# Patient Record
Sex: Male | Born: 1950 | Race: White | Hispanic: No | Marital: Married | Smoking: Never smoker
Health system: Southern US, Community
[De-identification: ages and names within clinical notes are randomized; demographics above are authoritative.]

## PROBLEM LIST (undated history)

## (undated) DIAGNOSIS — E785 Hyperlipidemia, unspecified: Secondary | ICD-10-CM

## (undated) DIAGNOSIS — I10 Essential (primary) hypertension: Secondary | ICD-10-CM

## (undated) DIAGNOSIS — D239 Other benign neoplasm of skin, unspecified: Secondary | ICD-10-CM

## (undated) DIAGNOSIS — Z872 Personal history of diseases of the skin and subcutaneous tissue: Secondary | ICD-10-CM

## (undated) DIAGNOSIS — C329 Malignant neoplasm of larynx, unspecified: Secondary | ICD-10-CM

## (undated) HISTORY — PX: COLONOSCOPY: SHX174

## (undated) HISTORY — PX: OTHER SURGICAL HISTORY: SHX169

## (undated) HISTORY — DX: Hyperlipidemia, unspecified: E78.5

## (undated) HISTORY — DX: Other benign neoplasm of skin, unspecified: D23.9

## (undated) HISTORY — DX: Essential (primary) hypertension: I10

## (undated) HISTORY — PX: KNEE ARTHROSCOPY: SUR90

## (undated) HISTORY — PX: PILONIDAL CYST EXCISION: SHX744

## (undated) HISTORY — DX: Malignant neoplasm of larynx, unspecified: C32.9

## (undated) HISTORY — PX: VASECTOMY: SHX75

## (undated) HISTORY — DX: Personal history of diseases of the skin and subcutaneous tissue: Z87.2

---

## 2006-09-19 DIAGNOSIS — C329 Malignant neoplasm of larynx, unspecified: Secondary | ICD-10-CM

## 2006-09-19 HISTORY — DX: Malignant neoplasm of larynx, unspecified: C32.9

## 2006-09-19 HISTORY — PX: NASAL SINUS SURGERY: SHX719

## 2006-09-19 HISTORY — PX: NASAL SEPTUM SURGERY: SHX37

## 2007-01-23 ENCOUNTER — Encounter: Payer: Self-pay | Admitting: Family Medicine

## 2007-01-23 ENCOUNTER — Ambulatory Visit: Payer: Self-pay | Admitting: Gastroenterology

## 2007-01-23 LAB — HM COLONOSCOPY

## 2009-02-09 DIAGNOSIS — D239 Other benign neoplasm of skin, unspecified: Secondary | ICD-10-CM

## 2009-02-09 HISTORY — DX: Other benign neoplasm of skin, unspecified: D23.9

## 2010-06-03 ENCOUNTER — Ambulatory Visit: Payer: Self-pay | Admitting: Internal Medicine

## 2017-03-23 ENCOUNTER — Ambulatory Visit: Payer: Self-pay | Admitting: Family Medicine

## 2017-04-25 ENCOUNTER — Ambulatory Visit: Payer: Self-pay | Admitting: Family Medicine

## 2018-01-30 DIAGNOSIS — D2272 Melanocytic nevi of left lower limb, including hip: Secondary | ICD-10-CM | POA: Diagnosis not present

## 2018-01-30 DIAGNOSIS — D224 Melanocytic nevi of scalp and neck: Secondary | ICD-10-CM | POA: Diagnosis not present

## 2018-01-30 DIAGNOSIS — L905 Scar conditions and fibrosis of skin: Secondary | ICD-10-CM | POA: Diagnosis not present

## 2018-01-30 DIAGNOSIS — L720 Epidermal cyst: Secondary | ICD-10-CM | POA: Diagnosis not present

## 2018-01-30 DIAGNOSIS — D225 Melanocytic nevi of trunk: Secondary | ICD-10-CM | POA: Diagnosis not present

## 2018-04-10 DIAGNOSIS — L720 Epidermal cyst: Secondary | ICD-10-CM | POA: Diagnosis not present

## 2018-04-17 DIAGNOSIS — L720 Epidermal cyst: Secondary | ICD-10-CM | POA: Diagnosis not present

## 2018-06-27 DIAGNOSIS — D485 Neoplasm of uncertain behavior of skin: Secondary | ICD-10-CM | POA: Diagnosis not present

## 2018-06-27 DIAGNOSIS — Z85828 Personal history of other malignant neoplasm of skin: Secondary | ICD-10-CM | POA: Diagnosis not present

## 2018-06-27 DIAGNOSIS — L918 Other hypertrophic disorders of the skin: Secondary | ICD-10-CM | POA: Diagnosis not present

## 2018-06-27 DIAGNOSIS — L821 Other seborrheic keratosis: Secondary | ICD-10-CM | POA: Diagnosis not present

## 2018-06-27 DIAGNOSIS — D2270 Melanocytic nevi of unspecified lower limb, including hip: Secondary | ICD-10-CM | POA: Diagnosis not present

## 2018-06-27 DIAGNOSIS — L578 Other skin changes due to chronic exposure to nonionizing radiation: Secondary | ICD-10-CM | POA: Diagnosis not present

## 2018-06-27 DIAGNOSIS — Z1283 Encounter for screening for malignant neoplasm of skin: Secondary | ICD-10-CM | POA: Diagnosis not present

## 2018-06-27 DIAGNOSIS — D225 Melanocytic nevi of trunk: Secondary | ICD-10-CM | POA: Diagnosis not present

## 2018-06-27 DIAGNOSIS — D2260 Melanocytic nevi of unspecified upper limb, including shoulder: Secondary | ICD-10-CM | POA: Diagnosis not present

## 2018-06-27 DIAGNOSIS — D224 Melanocytic nevi of scalp and neck: Secondary | ICD-10-CM | POA: Diagnosis not present

## 2018-07-19 ENCOUNTER — Ambulatory Visit (INDEPENDENT_AMBULATORY_CARE_PROVIDER_SITE_OTHER): Payer: Medicare HMO | Admitting: Family Medicine

## 2018-07-19 ENCOUNTER — Encounter: Payer: Self-pay | Admitting: Family Medicine

## 2018-07-19 VITALS — BP 172/92 | HR 62 | Temp 98.3°F | Ht 72.0 in | Wt 245.2 lb

## 2018-07-19 DIAGNOSIS — Z6833 Body mass index (BMI) 33.0-33.9, adult: Secondary | ICD-10-CM | POA: Diagnosis not present

## 2018-07-19 DIAGNOSIS — I10 Essential (primary) hypertension: Secondary | ICD-10-CM | POA: Diagnosis not present

## 2018-07-19 DIAGNOSIS — Z8521 Personal history of malignant neoplasm of larynx: Secondary | ICD-10-CM | POA: Diagnosis not present

## 2018-07-19 DIAGNOSIS — Z23 Encounter for immunization: Secondary | ICD-10-CM | POA: Diagnosis not present

## 2018-07-19 DIAGNOSIS — E669 Obesity, unspecified: Secondary | ICD-10-CM

## 2018-07-19 DIAGNOSIS — E785 Hyperlipidemia, unspecified: Secondary | ICD-10-CM | POA: Insufficient documentation

## 2018-07-19 DIAGNOSIS — R69 Illness, unspecified: Secondary | ICD-10-CM | POA: Diagnosis not present

## 2018-07-19 DIAGNOSIS — Z1159 Encounter for screening for other viral diseases: Secondary | ICD-10-CM

## 2018-07-19 DIAGNOSIS — Z114 Encounter for screening for human immunodeficiency virus [HIV]: Secondary | ICD-10-CM

## 2018-07-19 MED ORDER — AMLODIPINE BESYLATE 10 MG PO TABS: 10.0000 mg | ORAL_TABLET | Freq: Every day | ORAL | 3 refills | Status: DC

## 2018-07-19 NOTE — Assessment & Plan Note (Signed)
Previous lipids unavailable Will recheck FLP and CMP May restart a statin pending ASCVD risk

## 2018-07-19 NOTE — Patient Instructions (Signed)
The CDC recommends two doses of Shingrix (the shingles vaccine) separated by 2 to 6 months for adults age 65 years and older. I recommend checking with your insurance plan regarding coverage for this vaccine.    Hypertension Hypertension, commonly called high blood pressure, is when the force of blood pumping through the arteries is too strong. The arteries are the blood vessels that carry blood from the heart throughout the body. Hypertension forces the heart to work harder to pump blood and may cause arteries to become narrow or stiff. Having untreated or uncontrolled hypertension can cause heart attacks, strokes, kidney disease, and other problems. A blood pressure reading consists of a higher number over a lower number. Ideally, your blood pressure should be below 120/80. The first ("top") number is called the systolic pressure. It is a measure of the pressure in your arteries as your heart beats. The second ("bottom") number is called the diastolic pressure. It is a measure of the pressure in your arteries as the heart relaxes. What are the causes? The cause of this condition is not known. What increases the risk? Some risk factors for high blood pressure are under your control. Others are not. Factors you can change  Smoking.  Having type 2 diabetes mellitus, high cholesterol, or both.  Not getting enough exercise or physical activity.  Being overweight.  Having too much fat, sugar, calories, or salt (sodium) in your diet.  Drinking too much alcohol. Factors that are difficult or impossible to change  Having chronic kidney disease.  Having a family history of high blood pressure.  Age. Risk increases with age.  Race. You may be at higher risk if you are African-American.  Gender. Men are at higher risk than women before age 24. After age 17, women are at higher risk than men.  Having obstructive sleep apnea.  Stress. What are the signs or symptoms? Extremely high blood  pressure (hypertensive crisis) may cause:  Headache.  Anxiety.  Shortness of breath.  Nosebleed.  Nausea and vomiting.  Severe chest pain.  Jerky movements you cannot control (seizures).  How is this diagnosed? This condition is diagnosed by measuring your blood pressure while you are seated, with your arm resting on a surface. The cuff of the blood pressure monitor will be placed directly against the skin of your upper arm at the level of your heart. It should be measured at least twice using the same arm. Certain conditions can cause a difference in blood pressure between your right and left arms. Certain factors can cause blood pressure readings to be lower or higher than normal (elevated) for a short period of time:  When your blood pressure is higher when you are in a health care provider's office than when you are at home, this is called white coat hypertension. Most people with this condition do not need medicines.  When your blood pressure is higher at home than when you are in a health care provider's office, this is called masked hypertension. Most people with this condition may need medicines to control blood pressure.  If you have a high blood pressure reading during one visit or you have normal blood pressure with other risk factors:  You may be asked to return on a different day to have your blood pressure checked again.  You may be asked to monitor your blood pressure at home for 1 week or longer.  If you are diagnosed with hypertension, you may have other blood or imaging tests  to help your health care provider understand your overall risk for other conditions. How is this treated? This condition is treated by making healthy lifestyle changes, such as eating healthy foods, exercising more, and reducing your alcohol intake. Your health care provider may prescribe medicine if lifestyle changes are not enough to get your blood pressure under control, and if:  Your  systolic blood pressure is above 130.  Your diastolic blood pressure is above 80.  Your personal target blood pressure may vary depending on your medical conditions, your age, and other factors. Follow these instructions at home: Eating and drinking  Eat a diet that is high in fiber and potassium, and low in sodium, added sugar, and fat. An example eating plan is called the DASH (Dietary Approaches to Stop Hypertension) diet. To eat this way: ? Eat plenty of fresh fruits and vegetables. Try to fill half of your plate at each meal with fruits and vegetables. ? Eat whole grains, such as whole wheat pasta, brown rice, or whole grain bread. Fill about one quarter of your plate with whole grains. ? Eat or drink low-fat dairy products, such as skim milk or low-fat yogurt. ? Avoid fatty cuts of meat, processed or cured meats, and poultry with skin. Fill about one quarter of your plate with lean proteins, such as fish, chicken without skin, beans, eggs, and tofu. ? Avoid premade and processed foods. These tend to be higher in sodium, added sugar, and fat.  Reduce your daily sodium intake. Most people with hypertension should eat less than 1,500 mg of sodium a day.  Limit alcohol intake to no more than 1 drink a day for nonpregnant women and 2 drinks a day for men. One drink equals 12 oz of beer, 5 oz of wine, or 1 oz of hard liquor. Lifestyle  Work with your health care provider to maintain a healthy body weight or to lose weight. Ask what an ideal weight is for you.  Get at least 30 minutes of exercise that causes your heart to beat faster (aerobic exercise) most days of the week. Activities may include walking, swimming, or biking.  Include exercise to strengthen your muscles (resistance exercise), such as pilates or lifting weights, as part of your weekly exercise routine. Try to do these types of exercises for 30 minutes at least 3 days a week.  Do not use any products that contain nicotine or  tobacco, such as cigarettes and e-cigarettes. If you need help quitting, ask your health care provider.  Monitor your blood pressure at home as told by your health care provider.  Keep all follow-up visits as told by your health care provider. This is important. Medicines  Take over-the-counter and prescription medicines only as told by your health care provider. Follow directions carefully. Blood pressure medicines must be taken as prescribed.  Do not skip doses of blood pressure medicine. Doing this puts you at risk for problems and can make the medicine less effective.  Ask your health care provider about side effects or reactions to medicines that you should watch for. Contact a health care provider if:  You think you are having a reaction to a medicine you are taking.  You have headaches that keep coming back (recurring).  You feel dizzy.  You have swelling in your ankles.  You have trouble with your vision. Get help right away if:  You develop a severe headache or confusion.  You have unusual weakness or numbness.  You feel faint.  You have severe pain in your chest or abdomen.  You vomit repeatedly.  You have trouble breathing. Summary  Hypertension is when the force of blood pumping through your arteries is too strong. If this condition is not controlled, it may put you at risk for serious complications.  Your personal target blood pressure may vary depending on your medical conditions, your age, and other factors. For most people, a normal blood pressure is less than 120/80.  Hypertension is treated with lifestyle changes, medicines, or a combination of both. Lifestyle changes include weight loss, eating a healthy, low-sodium diet, exercising more, and limiting alcohol. This information is not intended to replace advice given to you by your health care provider. Make sure you discuss any questions you have with your health care provider. Document Released:  09/05/2005 Document Revised: 08/03/2016 Document Reviewed: 08/03/2016 Elsevier Interactive Patient Education  [employer].

## 2018-07-19 NOTE — Assessment & Plan Note (Signed)
Uncontrolled But not currently on any therapy Will start amlodipine 10mg  daily Discussed home BP monitoring Check CMP F/u in 6 weeks

## 2018-07-19 NOTE — Assessment & Plan Note (Signed)
Cleared by oncology Doing well

## 2018-07-19 NOTE — Assessment & Plan Note (Signed)
Discussed importance of healthy weight management Discussed diet and exercise  

## 2018-07-19 NOTE — Progress Notes (Signed)
Patient: Tyler [name], Male    DOB: 04/12/1951, 67 y.o.   MRN: [id number] Visit Date: 07/19/2018  Today's Provider: Lavon Paganini, MD   Chief Complaint  Patient presents with  . Establish Care   Subjective:    New Patient: Tyler [name] is a 67 y.o. male who presents today to establish care as a new patient. Patient was a patient at [facility].  He feels well. He reports exercising lightly- golfing 3 days per week and working. He reports he is sleeping well.  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  He has been cleared.  He has never smoked or used chewing tobacco.  Patient has h/o HTN and HLD.  He has previously been prescribed ASA, benazepril and crestor, but admits that he does not take his medications.  He also knows that he does not watch his weight like he should.  2 Paternal uncles and brother with colon cancer in their 87s.  He had colonoscopy in 2008, but believes he has had one more recently. -----------------------------------------------------------------   Review of Systems  Constitutional: Negative.   HENT: Positive for dental problem. Negative for congestion, drooling, ear discharge, ear pain, facial swelling, hearing loss, mouth sores, nosebleeds, postnasal drip, rhinorrhea, sinus pressure, sinus pain, sneezing, sore throat, tinnitus, trouble swallowing and voice change.   Eyes: Negative.   Respiratory: Negative.   Cardiovascular: Negative.   Gastrointestinal: Negative.   Endocrine: Negative.   Genitourinary: Negative.   Musculoskeletal: Negative.   Skin: Negative.   Allergic/Immunologic: Negative.   Neurological: Negative.   Hematological: Negative.   Psychiatric/Behavioral: Negative.     Social History      He  reports that he has never smoked. He has never used smokeless tobacco. He reports that he drinks about 3.0 standard drinks of alcohol per week.       Social History    Socioeconomic History  . Marital status: Married    Spouse name: Not on file  . Number of children: Not on file  . Years of education: Not on file  . Highest education level: Not on file  Occupational History  . Not on file  Social Needs  . Financial resource strain: Not on file  . Food insecurity:    Worry: Not on file    Inability: Not on file  . Transportation needs:    Medical: Not on file    Non-medical: Not on file  Tobacco Use  . Smoking status: Never Smoker  . Smokeless tobacco: Never Used  Substance and Sexual Activity  . Alcohol use: Yes    Alcohol/week: 3.0 standard drinks    Types: 3 Cans of beer per week  . Drug use: Not on file  . Sexual activity: Yes    Partners: Female  Lifestyle  . Physical activity:    Days per week: Not on file    Minutes per session: Not on file  . Stress: Not on file  Relationships  . Social connections:    Talks on phone: Not on file    Gets together: Not on file    Attends religious service: Not on file    Active member of club or organization: Not on file    Attends meetings of clubs or organizations: Not on file    Relationship status: Not on file  Other Topics Concern  . Not on file  Social History Narrative  . Not  on file    Past Medical History:  Diagnosis Date  . History of pilonidal cyst   . Hyperlipidemia   . Hypertension   . Laryngeal cancer ([place])      There are no active problems to display for this patient.   Past Surgical History:  Procedure Laterality Date  . NASAL SEPTUM SURGERY  12-21-2006  . NASAL SINUS SURGERY  12/21/06  . right knee surgery    . VASECTOMY      Family History        Family Status  Relation Name Status  . Mother  Deceased       12/20/05  . Father  Deceased       75  . Brother  Deceased  . MGM  Deceased  . MGF  Deceased  . PGM  Deceased  . PGF  Deceased  . Daughter  Alive  . Daughter  Alive        His family history includes CVA in his mother; Colon cancer in his brother;  Diabetes in his brother; Healthy in his daughter and daughter; Heart disease in his mother; Hyperlipidemia in his mother; Hypertension in his mother; Leukemia in his father.      No Known Allergies   Current Outpatient Medications:  .  aspirin EC 81 MG tablet, Take by mouth., Disp: , Rfl:  .  benazepril (LOTENSIN) 40 MG tablet, Take by mouth., Disp: , Rfl:  .  rosuvastatin (CRESTOR) 40 MG tablet, Take by mouth., Disp: , Rfl:  .  sildenafil (VIAGRA) 100 MG tablet, Take by mouth., Disp: , Rfl:    Patient Care Team: Virginia Crews, MD as PCP - General (Family Medicine)      Objective:   Vitals: BP (!) 172/92 (BP Location: Right Arm, Patient Position: Sitting, Cuff Size: Large)   Pulse 62   Temp 98.3 F (36.8 C) (Oral)   Ht 6' (1.829 m)   Wt 245 lb 3.2 oz (111.2 kg)   SpO2 94%   BMI 33.26 kg/m    Vitals:   07/19/18 1011  BP: (!) 172/92  Pulse: 62  Temp: 98.3 F (36.8 C)  TempSrc: Oral  SpO2: 94%  Weight: 245 lb 3.2 oz (111.2 kg)  Height: 6' (1.829 m)     Physical Exam   Depression Screen PHQ 2/9 Scores 07/19/2018  PHQ - 2 Score 0  PHQ- 9 Score 0      Assessment & Plan:     Establish care  Exercise Activities and Dietary recommendations Goals   None     Immunization History  Administered Date(s) Administered  . Influenza, High Dose Seasonal PF 07/19/2018  . Pneumococcal Conjugate-13 07/19/2018  . Td 03/04/2008    Health Maintenance  Topic Date Due  . Hepatitis C Screening  18-Nov-1950  . HIV Screening  02/16/1968  . COLONOSCOPY  01/22/2017  . PNA vac Low Risk Adult (1 of 2 - PCV13) 02/15/2018  . TETANUS/TDAP  03/04/2018  . INFLUENZA VACCINE  04/19/2018     Discussed health benefits of physical activity, and encouraged him to engage in regular exercise appropriate for his age and condition.   Request for information sent to urgent care for last tetanus shot and hospital for last colonoscopy report     --------------------------------------------------------------------   Problem List Items Addressed This Visit      Cardiovascular and Mediastinum   Essential hypertension - Primary    Uncontrolled But not currently on any therapy Will start amlodipine 10mg   daily Discussed home BP monitoring Check CMP F/u in 6 weeks      Relevant Medications   sildenafil (VIAGRA) 100 MG tablet   amLODipine (NORVASC) 10 MG tablet   Other Relevant Orders   Comprehensive metabolic panel     Respiratory   History of laryngeal cancer    Cleared by oncology Doing well        Other   Hyperlipidemia    Previous lipids unavailable Will recheck FLP and CMP May restart a statin pending ASCVD risk      Relevant Medications   sildenafil (VIAGRA) 100 MG tablet   amLODipine (NORVASC) 10 MG tablet   Other Relevant Orders   Lipid panel   Comprehensive metabolic panel   Obesity    Discussed importance of healthy weight management Discussed diet and exercise      Relevant Orders   Lipid panel   Comprehensive metabolic panel   CBC w/Diff/Platelet    Other Visit Diagnoses    Need for hepatitis C screening test       Relevant Orders   Hepatitis C Antibody   Screening for HIV (human immunodeficiency virus)       Relevant Orders   HIV Antibody (routine testing w rflx)   Need for influenza vaccination       Relevant Orders   Flu vaccine HIGH DOSE PF (Completed)   Need for pneumococcal vaccination       Relevant Orders   Pneumococcal conjugate vaccine 13-valent IM (Completed)       Return in about 6 weeks (around 08/30/2018) for Welcome to Medicare and BP f/u.   The entirety of the information documented in the History of Present Illness, Review of Systems and Physical Exam were personally obtained by me. Portions of this information were initially documented by Tiburcio Pea, CMA and reviewed by me for thoroughness and accuracy.    Virginia Crews, MD, MPH [facility] 07/19/2018 11:31 AM

## 2018-07-23 DIAGNOSIS — E669 Obesity, unspecified: Secondary | ICD-10-CM | POA: Diagnosis not present

## 2018-07-23 DIAGNOSIS — Z1159 Encounter for screening for other viral diseases: Secondary | ICD-10-CM | POA: Diagnosis not present

## 2018-07-23 DIAGNOSIS — Z114 Encounter for screening for human immunodeficiency virus [HIV]: Secondary | ICD-10-CM | POA: Diagnosis not present

## 2018-07-23 DIAGNOSIS — Z6833 Body mass index (BMI) 33.0-33.9, adult: Secondary | ICD-10-CM | POA: Diagnosis not present

## 2018-07-23 DIAGNOSIS — E785 Hyperlipidemia, unspecified: Secondary | ICD-10-CM | POA: Diagnosis not present

## 2018-07-23 DIAGNOSIS — R69 Illness, unspecified: Secondary | ICD-10-CM | POA: Diagnosis not present

## 2018-07-23 DIAGNOSIS — I10 Essential (primary) hypertension: Secondary | ICD-10-CM | POA: Diagnosis not present

## 2018-07-24 ENCOUNTER — Telehealth: Payer: Self-pay

## 2018-07-24 LAB — COMPREHENSIVE METABOLIC PANEL
ALT: 18 IU/L (ref 0–44)
AST: 15 IU/L (ref 0–40)
Albumin/Globulin Ratio: 1.2 (ref 1.2–2.2)
Albumin: 4 g/dL (ref 3.6–4.8)
Alkaline Phosphatase: 64 IU/L (ref 39–117)
BUN/Creatinine Ratio: 13 (ref 10–24)
BUN: 17 mg/dL (ref 8–27)
Bilirubin Total: 0.5 mg/dL (ref 0.0–1.2)
CO2: 21 mmol/L (ref 20–29)
Calcium: 9.5 mg/dL (ref 8.6–10.2)
Chloride: 102 mmol/L (ref 96–106)
Creatinine, Ser: 1.28 mg/dL — ABNORMAL HIGH (ref 0.76–1.27)
GFR calc Af Amer: 67 mL/min/{1.73_m2} (ref >59)
GFR calc non Af Amer: 58 mL/min/{1.73_m2} — ABNORMAL LOW (ref >59)
Globulin, Total: 3.3 g/dL (ref 1.5–4.5)
Glucose: 93 mg/dL (ref 65–99)
Potassium: 4.6 mmol/L (ref 3.5–5.2)
Sodium: 140 mmol/L (ref 134–144)
Total Protein: 7.3 g/dL (ref 6.0–8.5)

## 2018-07-24 LAB — CBC WITH DIFFERENTIAL/PLATELET
Basophils Absolute: 0.1 10*3/uL (ref 0.0–0.2)
Basos: 1 %
EOS (ABSOLUTE): 0.1 10*3/uL (ref 0.0–0.4)
Eos: 2 %
Hematocrit: 47.1 % (ref 37.5–51.0)
Hemoglobin: 16.5 g/dL (ref 13.0–17.7)
Immature Grans (Abs): 0 10*3/uL (ref 0.0–0.1)
Immature Granulocytes: 0 %
Lymphocytes Absolute: 0.9 10*3/uL (ref 0.7–3.1)
Lymphs: 16 %
MCH: 31.9 pg (ref 26.6–33.0)
MCHC: 35 g/dL (ref 31.5–35.7)
MCV: 91 fL (ref 79–97)
Monocytes Absolute: 0.5 10*3/uL (ref 0.1–0.9)
Monocytes: 9 %
Neutrophils Absolute: 4 10*3/uL (ref 1.4–7.0)
Neutrophils: 72 %
Platelets: 241 10*3/uL (ref 150–450)
RBC: 5.17 x10E6/uL (ref 4.14–5.80)
RDW: 12.5 % (ref 12.3–15.4)
WBC: 5.6 10*3/uL (ref 3.4–10.8)

## 2018-07-24 LAB — LIPID PANEL
Chol/HDL Ratio: 6.8 ratio — ABNORMAL HIGH (ref 0.0–5.0)
Cholesterol, Total: 277 mg/dL — ABNORMAL HIGH (ref 100–199)
HDL: 41 mg/dL (ref >39)
LDL Calculated: 181 mg/dL — ABNORMAL HIGH (ref 0–99)
Triglycerides: 273 mg/dL — ABNORMAL HIGH (ref 0–149)
VLDL Cholesterol Cal: 55 mg/dL — ABNORMAL HIGH (ref 5–40)

## 2018-07-24 LAB — HEPATITIS C ANTIBODY: Hep C Virus Ab: <0.1 s/co ratio (ref 0.0–0.9)

## 2018-07-24 LAB — HIV ANTIBODY (ROUTINE TESTING W REFLEX): HIV Screen 4th Generation wRfx: NONREACTIVE

## 2018-07-24 NOTE — Telephone Encounter (Signed)
-----   Message from Virginia Crews, MD sent at 07/24/2018  9:27 AM EST ----- Negative hepatitis C and HIV screening.  Cholesterol is quite elevated at nearly double the normal range.  Give believe he need to be on a statin.  Recommend Crestor as you were previously taking this.  Would start with 10 mg daily.  Would recheck cholesterol in about 3 months to see if we are having the effect that is desired.  Normal blood counts, blood sugar, electrolytes, liver function.  Kidney function is slightly decreased.  Unclear if this is a chronic problem or an acute bump.  Recommend staying hydrated and avoiding NSAIDs.  We will recheck at next visit.  Virginia Crews, MD, MPH [facility] 07/24/2018 9:27 AM

## 2018-07-24 NOTE — Telephone Encounter (Signed)
Returned patient call no answer so [place].

## 2018-07-24 NOTE — Telephone Encounter (Signed)
LMTCB

## 2018-07-24 NOTE — Telephone Encounter (Signed)
Pt returned call

## 2018-07-30 NOTE — Telephone Encounter (Signed)
LMTCB

## 2018-07-31 NOTE — Telephone Encounter (Signed)
Patient was advised and stated the Crestor can be send to the [facility] on [address]. Patient states she will schedule his 3 month follow-up at his physical appointment on 09/03/2018.

## 2018-08-01 MED ORDER — ROSUVASTATIN CALCIUM 10 MG PO TABS: 10.0000 mg | ORAL_TABLET | Freq: Every day | ORAL | 3 refills | Status: DC

## 2018-08-01 NOTE — Telephone Encounter (Signed)
Med sent to pharmacy  Bacigalupo, Dionne Bucy, MD, MPH [facility] 08/01/2018 10:37 AM

## 2018-09-03 ENCOUNTER — Ambulatory Visit (INDEPENDENT_AMBULATORY_CARE_PROVIDER_SITE_OTHER): Payer: Medicare HMO | Admitting: Family Medicine

## 2018-09-03 ENCOUNTER — Encounter: Payer: Self-pay | Admitting: Family Medicine

## 2018-09-03 VITALS — BP 139/89 | HR 64 | Temp 98.0°F | Ht 72.0 in | Wt 252.8 lb

## 2018-09-03 DIAGNOSIS — Z0000 Encounter for general adult medical examination without abnormal findings: Secondary | ICD-10-CM | POA: Diagnosis not present

## 2018-09-03 DIAGNOSIS — Z1211 Encounter for screening for malignant neoplasm of colon: Secondary | ICD-10-CM | POA: Diagnosis not present

## 2018-09-03 NOTE — Patient Instructions (Signed)
Preventive Care 65 Years and Older, Male Preventive care refers to lifestyle choices and visits with your health care provider that can promote health and wellness. What does preventive care include?  A yearly physical exam. This is also called an annual well check.  Dental exams once or twice a year.  Routine eye exams. Ask your health care provider how often you should have your eyes checked.  Personal lifestyle choices, including: ? Daily care of your teeth and gums. ? Regular physical activity. ? Eating a healthy diet. ? Avoiding tobacco and drug use. ? Limiting alcohol use. ? Practicing safe sex. ? Taking low doses of aspirin every day. ? Taking vitamin and mineral supplements as recommended by your health care provider. What happens during an annual well check? The services and screenings done by your health care provider during your annual well check will depend on your age, overall health, lifestyle risk factors, and family history of disease. Counseling Your health care provider may ask you questions about your:  Alcohol use.  Tobacco use.  Drug use.  Emotional well-being.  Home and relationship well-being.  Sexual activity.  Eating habits.  History of falls.  Memory and ability to understand (cognition).  Work and work environment.  Screening You may have the following tests or measurements:  Height, weight, and BMI.  Blood pressure.  Lipid and cholesterol levels. These may be checked every 5 years, or more frequently if you are over 50 years old.  Skin check.  Lung cancer screening. You may have this screening every year starting at age 55 if you have a 30-pack-year history of smoking and currently smoke or have quit within the past 15 years.  Fecal occult blood test (FOBT) of the stool. You may have this test every year starting at age 50.  Flexible sigmoidoscopy or colonoscopy. You may have a sigmoidoscopy every 5 years or a colonoscopy every 10  years starting at age 50.  Prostate cancer screening. Recommendations will vary depending on your family history and other risks.  Hepatitis C blood test.  Hepatitis B blood test.  Sexually transmitted disease (STD) testing.  Diabetes screening. This is done by checking your blood sugar (glucose) after you have not eaten for a while (fasting). You may have this done every 1-3 years.  Abdominal aortic aneurysm (AAA) screening. You may need this if you are a current or former smoker.  Osteoporosis. You may be screened starting at age 70 if you are at high risk.  Talk with your health care provider about your test results, treatment options, and if necessary, the need for more tests. Vaccines Your health care provider may recommend certain vaccines, such as:  Influenza vaccine. This is recommended every year.  Tetanus, diphtheria, and acellular pertussis (Tdap, Td) vaccine. You may need a Td booster every 10 years.  Varicella vaccine. You may need this if you have not been vaccinated.  Zoster vaccine. You may need this after age 60.  Measles, mumps, and rubella (MMR) vaccine. You may need at least one dose of MMR if you were born in 1957 or later. You may also need a second dose.  Pneumococcal 13-valent conjugate (PCV13) vaccine. One dose is recommended after age 65.  Pneumococcal polysaccharide (PPSV23) vaccine. One dose is recommended after age 65.  Meningococcal vaccine. You may need this if you have certain conditions.  Hepatitis A vaccine. You may need this if you have certain conditions or if you travel or work in places where you   may be exposed to hepatitis A.  Hepatitis B vaccine. You may need this if you have certain conditions or if you travel or work in places where you may be exposed to hepatitis B.  Haemophilus influenzae type b (Hib) vaccine. You may need this if you have certain risk factors.  Talk to your health care provider about which screenings and vaccines  you need and how often you need them. This information is not intended to replace advice given to you by your health care provider. Make sure you discuss any questions you have with your health care provider. Document Released: 10/02/2015 Document Revised: 05/25/2016 Document Reviewed: 07/07/2015 Elsevier Interactive Patient Education  2018 [employer]  

## 2018-09-03 NOTE — Progress Notes (Signed)
Patient: Tyler [name], Male    DOB: 19-Sep-1951, 67 y.o.   MRN: [id number] Visit Date: 09/03/2018  Today's Provider: Lavon Paganini, MD   Chief Complaint  Patient presents with  . Medicare Wellness   Subjective:  I, Tyler [name], CMA, am acting as a scribe for Lavon Paganini, MD.   Initial preventative physical exam Tyler [name] is a 67 y.o. male who presents today for his Initial Preventative Physical Exam. He feels well. He reports  No regular exercise, plays golf and yard work if weather permits. He reports he is sleeping well.  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   HENT: Positive for dental problem.   Eyes: Negative.   Respiratory: Negative.   Cardiovascular: Negative.   Gastrointestinal: Negative.   Endocrine: Negative.   Genitourinary: Negative.   Musculoskeletal: Negative.   Skin: Negative.   Allergic/Immunologic: Negative.   Neurological: Negative.   Hematological: Negative.   Psychiatric/Behavioral: Negative.     Social History   Socioeconomic History  . Marital status: Married    Spouse name: Not on file  . Number of children: 2  . Years of education: Not on file  . Highest education level: Not on file  Occupational History  . Occupation: retired Advertising copywriter  . Financial resource strain: Not on file  . Food insecurity:    Worry: Not on file    Inability: Not on file  . Transportation needs:    Medical: Not on file    Non-medical: Not on file  Tobacco Use  . Smoking status: Never Smoker  . Smokeless tobacco: Never Used  Substance and Sexual Activity  . Alcohol use: Yes    Alcohol/week: 3.0 standard drinks    Types: 3 Cans of beer per week  . Drug use: Never  . Sexual activity: Yes    Partners: Female    Birth control/protection: Surgical  Lifestyle  .  Physical activity:    Days per week: Not on file    Minutes per session: Not on file  . Stress: Not on file  Relationships  . Social connections:    Talks on phone: Not on file    Gets together: Not on file    Attends religious service: Not on file    Active member of club or organization: Not on file    Attends meetings of clubs or organizations: Not on file    Relationship status: Not on file  . Intimate partner violence:    Fear of current or ex partner: Not on file    Emotionally abused: Not on file    Physically abused: Not on file    Forced sexual activity: Not on file  Other Topics Concern  . Not on file  Social History Narrative  . Not on file    Past Medical History:  Diagnosis Date  . History of pilonidal cyst   . Hyperlipidemia   . Hypertension   . 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 Essential hypertension 07/19/2018  . Hyperlipidemia 07/19/2018  . History of laryngeal cancer 07/19/2018  . Obesity 07/19/2018    Past Surgical History:  Procedure Laterality Date  . KNEE ARTHROSCOPY Right   . NASAL  SEPTUM SURGERY  2008  . NASAL SINUS SURGERY  2008  . PILONIDAL CYST EXCISION    . VASECTOMY      His family history includes CVA (age of onset: 73) in his mother; Colon cancer in his paternal uncle; Colon cancer (age of onset: 64) in his brother; Diabetes in his brother; Healthy in his daughter and daughter; Heart disease in his mother; Hyperlipidemia in his mother; Hypertension in his mother; Leukemia in his father.      Current Outpatient Medications:  .  amLODipine (NORVASC) 10 MG tablet, Take 1 tablet (10 mg total) by mouth daily., Disp: 30 tablet, Rfl: 3 .  amoxicillin (AMOXIL) 500 MG tablet, Take 500 mg by mouth 2 (two) times daily., Disp: , Rfl:  .  rosuvastatin (CRESTOR) 10 MG tablet, Take 1 tablet (10 mg total) by mouth daily., Disp: 90 tablet, Rfl: 3 .   sildenafil (VIAGRA) 100 MG tablet, Take by mouth., Disp: , Rfl:    Patient Care Team: Virginia Crews, MD as PCP - General (Family Medicine)      Objective:   Vitals: BP (!) 162/84 (BP Location: Right Arm, Patient Position: Sitting, Cuff Size: Large)   Pulse 64   Temp 98 F (36.7 C) (Oral)   Ht 6' (1.829 m)   Wt 252 lb 12.8 oz (114.7 kg)   SpO2 93%   BMI 34.29 kg/m   Physical Exam Vitals signs reviewed.  Constitutional:      General: He is not in acute distress.    Appearance: Normal appearance. He is well-developed. He is not diaphoretic.  HENT:     Head: Normocephalic and atraumatic.     Right Ear: External ear normal.     Left Ear: External ear normal.     Nose: Nose normal.     Mouth/Throat:     Mouth: Mucous membranes are moist.     Pharynx: Oropharynx is clear. No oropharyngeal exudate.  Eyes:     General: No scleral icterus.    Conjunctiva/sclera: Conjunctivae normal.     Pupils: Pupils are equal, round, and reactive to light.  Neck:     Musculoskeletal: Neck supple.     Thyroid: No thyromegaly.  Cardiovascular:     Rate and Rhythm: Normal rate and regular rhythm.     Pulses: Normal pulses.     Heart sounds: Normal heart sounds. No murmur.  Pulmonary:     Effort: Pulmonary effort is normal. No respiratory distress.     Breath sounds: Normal breath sounds. No wheezing or rales.  Abdominal:     General: Bowel sounds are normal. There is no distension.     Palpations: Abdomen is soft.     Tenderness: There is no abdominal tenderness. There is no guarding or rebound.  Musculoskeletal:        General: No deformity.     Right lower leg: No edema.     Left lower leg: No edema.  Lymphadenopathy:     Cervical: No cervical adenopathy.  Skin:    General: Skin is warm and dry.     Capillary Refill: Capillary refill takes less than 2 seconds.     Findings: No rash.  Neurological:     Mental Status: He is alert and oriented to person, place, and time.      Cranial Nerves: No cranial nerve deficit.  Psychiatric:        Mood and Affect: Mood normal.        Behavior: Behavior normal.  Thought Content: Thought content normal.     No exam data present  Activities of Daily Living In your present state of health, do you have any difficulty performing the following activities: 09/03/2018 07/19/2018  Hearing? N N  Vision? N N  Difficulty concentrating or making decisions? N N  Walking or climbing stairs? N N  Dressing or bathing? N N  Doing errands, shopping? N N  Some recent data might be hidden    Fall Risk Assessment Fall Risk  09/03/2018 07/19/2018  Falls in the past year? 0 No     Depression Screen PHQ 2/9 Scores 09/03/2018 07/19/2018  PHQ - 2 Score 0 0  PHQ- 9 Score 0 0    Cognitive Testing - 6-CIT  Correct? Score   What year is it? yes 0 0 or 4  What month is it? yes 0 0 or 3  Memorize:    Pia Mau,  44,  [place],      What time is it? (within 1 hour) yes 0 0 or 3  Count backwards from 20 yes 0 0, 2, or 4  Name the months of the year yes 0 0, 2, or 4  Repeat name & address above yes 0 0, 2, 4, 6, 8, or 10       TOTAL SCORE  0/28   Interpretation:  Normal  Normal (0-7) Abnormal (8-28)    Hearing screen likely not accurate as tester doesn't seem to be working correctly currently.  Patient declines any difficulty.  EKG: NSR, no signs of ischemia  Assessment & Plan:     Initial Preventative Physical Exam  Reviewed patient's Family Medical History Reviewed and updated list of patient's medical providers Assessment of cognitive impairment was done Assessed patient's functional ability Established a written schedule for health screening [place] Completed and Reviewed  Exercise Activities and Dietary recommendations Goals   None     Immunization History  Administered Date(s) Administered  . Influenza, High Dose Seasonal PF 07/19/2018  . Pneumococcal Conjugate-13  07/19/2018  . Td 03/04/2008  . Tdap 05/11/2015    Health Maintenance  Topic Date Due  . COLONOSCOPY  01/22/2017  . PNA vac Low Risk Adult (2 of 2 - PPSV23) 07/20/2019  . TETANUS/TDAP  05/10/2025  . INFLUENZA VACCINE  Completed  . Hepatitis C Screening  Completed  . HIV Screening  Completed     Discussed health benefits of physical activity, and encouraged him to engage in regular exercise appropriate for his age and condition.    ------------------------------------------------------------------------------------------------------------  Problem List Items Addressed This Visit    None    Visit Diagnoses    Welcome to Medicare preventive visit    -  Primary   Relevant Orders   EKG 12-Lead (Completed)   Screen for colon cancer       Relevant Orders   Ambulatory referral to Gastroenterology       Return in about 2 months (around 11/04/2018) for BP and cholesterol f/u.   The entirety of the information documented in the History of Present Illness, Review of Systems and Physical Exam were personally obtained by me. Portions of this information were initially documented by Tyler [name], CMA and reviewed by me for thoroughness and accuracy.    Virginia Crews, MD, MPH [facility] 09/03/2018 12:03 PM

## 2018-09-06 ENCOUNTER — Telehealth: Payer: Self-pay

## 2018-09-06 ENCOUNTER — Other Ambulatory Visit: Payer: Self-pay

## 2018-09-06 DIAGNOSIS — Z1211 Encounter for screening for malignant neoplasm of colon: Secondary | ICD-10-CM

## 2018-09-06 NOTE — Telephone Encounter (Signed)
Contacted patient to schedule for colonoscopy.  Colonoscopy has been scheduled for 0121/20 at [facility] with Dr. Allen Norris.  Thanks [employer]

## 2018-10-09 ENCOUNTER — Encounter: Payer: Self-pay | Admitting: Certified Registered Nurse Anesthetist

## 2018-10-09 ENCOUNTER — Ambulatory Visit: Payer: Medicare HMO | Admitting: Certified Registered Nurse Anesthetist

## 2018-10-09 ENCOUNTER — Ambulatory Visit
Admission: RE | Admit: 2018-10-09 | Discharge: 2018-10-09 | Disposition: A | Discharge status: Home / Self Care | Payer: Medicare HMO | Source: Ambulatory Visit | Attending: Gastroenterology | Admitting: Gastroenterology

## 2018-10-09 ENCOUNTER — Encounter
Admission: RE | Disposition: A | Discharge status: Home / Self Care | Payer: Self-pay | Source: Ambulatory Visit | Attending: Gastroenterology

## 2018-10-09 DIAGNOSIS — Z860100 Personal history of colon polyps, unspecified: Secondary | ICD-10-CM

## 2018-10-09 DIAGNOSIS — Z1211 Encounter for screening for malignant neoplasm of colon: Secondary | ICD-10-CM

## 2018-10-09 DIAGNOSIS — D124 Benign neoplasm of descending colon: Secondary | ICD-10-CM

## 2018-10-09 DIAGNOSIS — E785 Hyperlipidemia, unspecified: Secondary | ICD-10-CM | POA: Diagnosis not present

## 2018-10-09 DIAGNOSIS — Z8521 Personal history of malignant neoplasm of larynx: Secondary | ICD-10-CM | POA: Insufficient documentation

## 2018-10-09 DIAGNOSIS — K635 Polyp of colon: Secondary | ICD-10-CM

## 2018-10-09 DIAGNOSIS — I10 Essential (primary) hypertension: Secondary | ICD-10-CM | POA: Insufficient documentation

## 2018-10-09 DIAGNOSIS — D125 Benign neoplasm of sigmoid colon: Secondary | ICD-10-CM

## 2018-10-09 DIAGNOSIS — Z9221 Personal history of antineoplastic chemotherapy: Secondary | ICD-10-CM | POA: Insufficient documentation

## 2018-10-09 DIAGNOSIS — Z79899 Other long term (current) drug therapy: Secondary | ICD-10-CM | POA: Insufficient documentation

## 2018-10-09 DIAGNOSIS — D123 Benign neoplasm of transverse colon: Secondary | ICD-10-CM | POA: Insufficient documentation

## 2018-10-09 DIAGNOSIS — D122 Benign neoplasm of ascending colon: Secondary | ICD-10-CM

## 2018-10-09 DIAGNOSIS — Z800 Family history of malignant neoplasm of digestive organs: Secondary | ICD-10-CM | POA: Diagnosis not present

## 2018-10-09 DIAGNOSIS — K640 First degree hemorrhoids: Secondary | ICD-10-CM | POA: Diagnosis not present

## 2018-10-09 DIAGNOSIS — Z923 Personal history of irradiation: Secondary | ICD-10-CM | POA: Diagnosis not present

## 2018-10-09 HISTORY — PX: COLONOSCOPY WITH PROPOFOL: SHX5780

## 2018-10-09 SURGERY — COLONOSCOPY WITH PROPOFOL
Anesthesia: General

## 2018-10-09 MED ORDER — SODIUM CHLORIDE 0.9 % IV SOLN
INTRAVENOUS | Status: DC
  Administered 2018-10-09: 1000 mL via INTRAVENOUS

## 2018-10-09 MED ORDER — PROPOFOL 500 MG/50ML IV EMUL
INTRAVENOUS | Status: DC | PRN
  Administered 2018-10-09: 130 ug/kg/min via INTRAVENOUS

## 2018-10-09 MED ORDER — LIDOCAINE HCL (CARDIAC) PF 100 MG/5ML IV SOSY
PREFILLED_SYRINGE | INTRAVENOUS | Status: DC | PRN
  Administered 2018-10-09: 50 mg via INTRAVENOUS

## 2018-10-09 MED ORDER — PROPOFOL 500 MG/50ML IV EMUL
INTRAVENOUS | Status: AC
  Filled 2018-10-09: qty 50

## 2018-10-09 MED ORDER — PROPOFOL 10 MG/ML IV BOLUS
INTRAVENOUS | Status: DC | PRN
  Administered 2018-10-09: 80 mg via INTRAVENOUS

## 2018-10-09 MED ORDER — LIDOCAINE HCL (PF) 2 % IJ SOLN
INTRAMUSCULAR | Status: AC
  Filled 2018-10-09: qty 10

## 2018-10-09 NOTE — Anesthesia Preprocedure Evaluation (Signed)
Anesthesia Evaluation  Patient identified by MRN, date of birth, ID band Patient awake    Reviewed: Allergy & Precautions, H&P , NPO status , Patient's Chart, lab work & pertinent test results  History of Anesthesia Complications Negative for: history of anesthetic complications  Airway Mallampati: III  TM Distance: <3 FB Neck ROM: limited    Dental  (+) Chipped   Pulmonary neg pulmonary ROS, neg shortness of breath,           Cardiovascular Exercise Tolerance: Good hypertension, (-) angina(-) Past MI and (-) DOE      Neuro/Psych negative neurological ROS  negative psych ROS   GI/Hepatic negative GI ROS, Neg liver ROS, neg GERD  ,  Endo/Other  negative endocrine ROS  Renal/GU negative Renal ROS  negative genitourinary   Musculoskeletal   Abdominal   Peds  Hematology negative hematology ROS (+)   Anesthesia Other Findings Past Medical History: No date: History of pilonidal cyst No date: Hyperlipidemia No date: Hypertension 2008: Laryngeal cancer ([place])     Comment:  Treated with Chemo and radiation at [facility]  Past Surgical History: No date: KNEE ARTHROSCOPY; Right 2008: NASAL SEPTUM SURGERY 2008: NASAL SINUS SURGERY No date: PILONIDAL CYST EXCISION No date: VASECTOMY     Reproductive/Obstetrics negative OB ROS                             Anesthesia Physical Anesthesia Plan  ASA: III  Anesthesia Plan: General   Post-op Pain Management:    Induction: Intravenous  PONV Risk Score and Plan: Propofol infusion and TIVA  Airway Management Planned: Natural Airway and Nasal Cannula  Additional Equipment:   Intra-op Plan:   Post-operative Plan:   Informed Consent: I have reviewed the patients History and Physical, chart, labs and discussed the procedure including the risks, benefits and alternatives for the proposed anesthesia with the patient or authorized representative who  has indicated his/her understanding and acceptance.     Dental Advisory Given  Plan Discussed with: Anesthesiologist, CRNA and Surgeon  Anesthesia Plan Comments: (Patient consented for risks of anesthesia including but not limited to:  - adverse reactions to medications - risk of intubation if required - damage to teeth, lips or other oral mucosa - sore throat or hoarseness - Damage to heart, brain, lungs or loss of life  Patient voiced understanding.)        Anesthesia Quick Evaluation

## 2018-10-09 NOTE — H&P (Signed)
Lucilla Lame, MD [address] [place], [place] [address] Phone: [phone] Fax : [phone]  Primary Care Physician:  Virginia Crews, MD Primary Gastroenterologist:  Dr. Allen Norris  Pre-Procedure History & Physical: HPI:  Tyler [name] is a 68 y.o. male is here for a screening colonoscopy.   Past Medical History:  Diagnosis Date  . History of pilonidal cyst   . Hyperlipidemia   . Hypertension   . 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 KNEE ARTHROSCOPY Right   . NASAL SEPTUM SURGERY  2008  . NASAL SINUS SURGERY  2008  . PILONIDAL CYST EXCISION    . VASECTOMY      Prior to Admission medications   Medication Sig Start Date End Date Taking? Authorizing Provider  amLODipine (NORVASC) 10 MG tablet Take 1 tablet (10 mg total) by mouth daily. 07/19/18   Virginia Crews, MD  amoxicillin (AMOXIL) 500 MG tablet Take 500 mg by mouth 2 (two) times daily.    [provider]  rosuvastatin (CRESTOR) 10 MG tablet Take 1 tablet (10 mg total) by mouth daily. 08/01/18   Virginia Crews, MD  sildenafil (VIAGRA) 100 MG tablet Take by mouth.    [provider]    Allergies as of 09/06/2018  . (No Known Allergies)    Family History  Problem Relation Age of Onset  . Heart disease Mother   . Hyperlipidemia Mother   . Hypertension Mother   . CVA Mother 21  . Leukemia Father   . Diabetes Brother   . Colon cancer Brother 43  . Healthy Daughter   . Healthy Daughter   . Colon cancer Paternal Uncle     Social History   Socioeconomic History  . Marital status: Married    Spouse name: Not on file  . Number of children: 2  . Years of education: Not on file  . Highest education level: Not on file  Occupational History  . Occupation: retired Advertising copywriter  . Financial resource strain: Not on file  . Food insecurity:    Worry: Not on  file    Inability: Not on file  . Transportation needs:    Medical: Not on file    Non-medical: Not on file  Tobacco Use  . Smoking status: Never Smoker  . Smokeless tobacco: Never Used  Substance and Sexual Activity  . Alcohol use: Yes    Alcohol/week: 3.0 standard drinks    Types: 3 Cans of beer per week  . Drug use: Never  . Sexual activity: Yes    Partners: Female    Birth control/protection: Surgical  Lifestyle  . Physical activity:    Days per week: Not on file    Minutes per session: Not on file  . Stress: Not on file  Relationships  . Social connections:    Talks on phone: Not on file    Gets together: Not on file    Attends religious service: Not on file    Active member of club or organization: Not on file    Attends meetings of clubs or organizations: Not on file    Relationship status: Not on file  . Intimate partner violence:    Fear of current or ex partner: Not on file    Emotionally abused: Not on file    Physically abused: Not on file  Forced sexual activity: Not on file  Other Topics Concern  . Not on file  Social History Narrative  . Not on file    Review of Systems: See HPI, otherwise negative ROS  Physical Exam: There were no vitals taken for this visit. General:   Alert,  pleasant and cooperative in NAD Head:  Normocephalic and atraumatic. Neck:  Supple; no masses or thyromegaly. Lungs:  Clear throughout to auscultation.    Heart:  Regular rate and rhythm. Abdomen:  Soft, nontender and nondistended. Normal bowel sounds, without guarding, and without rebound.   Neurologic:  Alert and  oriented x4;  grossly normal neurologically.  Impression/Plan: Tyler [name] is now here to undergo a screening colonoscopy.  Risks, benefits, and alternatives regarding colonoscopy have been reviewed with the patient.  Questions have been answered.  All parties agreeable.

## 2018-10-09 NOTE — Anesthesia Post-op Follow-up Note (Signed)
Anesthesia QCDR form completed.        

## 2018-10-09 NOTE — Op Note (Signed)
[facility] Gastroenterology Patient Name: Tyler [name] Procedure Date: 10/09/2018 7:42 AM MRN: [id number] Account #: [id number] Date of Birth: [birth year] Admit Type: Outpatient Age: 68 Room: [facility] ENDO ROOM 4 Gender: Male Note Status: Finalized Procedure:            Colonoscopy Indications:          Screening for colorectal malignant neoplasm Providers:            Lucilla Lame MD, MD Medicines:            Propofol per Anesthesia Complications:        No immediate complications. Procedure:            Pre-Anesthesia Assessment:                       - Prior to the procedure, a History and Physical was                        performed, and patient medications and allergies were                        reviewed. The patient's tolerance of previous                        anesthesia was also reviewed. The risks and benefits of                        the procedure and the sedation options and risks were                        discussed with the patient. All questions were                        answered, and informed consent was obtained. Prior                        Anticoagulants: The patient has taken no previous                        anticoagulant or antiplatelet agents. ASA Grade                        Assessment: II - A patient with mild systemic disease.                        After reviewing the risks and benefits, the patient was                        deemed in satisfactory condition to undergo the                        procedure.                       After obtaining informed consent, the colonoscope was                        passed under direct vision. Throughout the procedure,                        the patient's blood pressure,  pulse, and oxygen                        saturations were monitored continuously. The                        Colonoscope was introduced through the anus and                        advanced to the the cecum, identified by appendiceal                         orifice and ileocecal valve. The colonoscopy was                        performed without difficulty. The patient tolerated the                        procedure well. The quality of the bowel preparation                        was fair. Findings:      The perianal and digital rectal examinations were normal.      A 3 mm polyp was found in the transverse colon. The polyp was sessile.       The polyp was removed with a cold biopsy forceps. Resection and       retrieval were complete.      Non-bleeding internal hemorrhoids were found during retroflexion. The       hemorrhoids were Grade I (internal hemorrhoids that do not prolapse). Impression:           - Preparation of the colon was fair.                       - One 3 mm polyp in the transverse colon, removed with                        a cold biopsy forceps. Resected and retrieved.                       - Non-bleeding internal hemorrhoids. Recommendation:       - Discharge patient to home.                       - Resume previous diet.                       - Continue present medications.                       - Await pathology results.                       - Repeat colonoscopy in 5 years for surveillance. Procedure Code(s):    --- Professional ---                       [id number], Colonoscopy, flexible; with biopsy, single or                        multiple Diagnosis Code(s):    --- Professional ---  Z12.11, Encounter for screening for malignant neoplasm                        of colon                       D12.3, Benign neoplasm of transverse colon (hepatic                        flexure or splenic flexure) CPT copyright 2018 American Medical Association. All rights reserved. The codes documented in this report are preliminary and upon coder review may  be revised to meet current compliance requirements. Lucilla Lame MD, MD 10/09/2018 9:14:15 AM This report has been signed  electronically. Number of Addenda: 0 Note Initiated On: 10/09/2018 7:42 AM Scope Withdrawal Time: 0 hours 17 minutes 16 seconds  Total Procedure Duration: 0 hours 23 minutes 42 seconds       [facility]

## 2018-10-09 NOTE — Anesthesia Postprocedure Evaluation (Signed)
Anesthesia Post Note  Patient: [facility] BLOODWORTH  Procedure(s) Performed: COLONOSCOPY WITH PROPOFOL (N/A )  Patient location during evaluation: Endoscopy Anesthesia Type: General Level of consciousness: awake and alert Pain management: pain level controlled Vital Signs Assessment: post-procedure vital signs reviewed and stable Respiratory status: spontaneous breathing, nonlabored ventilation, respiratory function stable and patient connected to nasal cannula oxygen Cardiovascular status: blood pressure returned to baseline and stable Postop Assessment: no apparent nausea or vomiting Anesthetic complications: no     Last Vitals:  Vitals:   10/09/18 0927 10/09/18 0947  BP: (!) 142/92 (!) 147/83  Pulse:    Resp:    Temp:    SpO2:      Last Pain:  Vitals:   10/09/18 0947  TempSrc:   PainSc: 0-No pain                 Precious Haws Juanantonio Stolar

## 2018-10-09 NOTE — Transfer of Care (Signed)
Immediate Anesthesia Transfer of Care Note  Patient: Tyler [name]  Procedure(s) Performed: COLONOSCOPY WITH PROPOFOL (N/A )  Patient Location: PACU and Endoscopy Unit  Anesthesia Type:General  Level of Consciousness: drowsy  Airway & Oxygen Therapy: Patient Spontanous Breathing  Post-op Assessment: Report given to RN and Post -op Vital signs reviewed and stable  Post vital signs: Reviewed and stable  Last Vitals:  Vitals Value Taken Time  BP 113/78 10/09/2018  9:18 AM  Temp 36.2 C 10/09/2018  9:17 AM  Pulse 61 10/09/2018  9:18 AM  Resp 17 10/09/2018  9:18 AM  SpO2 94 % 10/09/2018  9:18 AM  Vitals shown include unvalidated device data.  Last Pain:  Vitals:   10/09/18 0917  TempSrc: Tympanic  PainSc: Asleep         Complications: No apparent anesthesia complications

## 2018-10-10 ENCOUNTER — Encounter: Payer: Self-pay | Admitting: Gastroenterology

## 2018-10-10 LAB — SURGICAL PATHOLOGY

## 2018-10-11 ENCOUNTER — Encounter: Payer: Self-pay | Admitting: Gastroenterology

## 2018-10-12 ENCOUNTER — Other Ambulatory Visit: Payer: Self-pay

## 2018-10-17 DIAGNOSIS — L820 Inflamed seborrheic keratosis: Secondary | ICD-10-CM | POA: Diagnosis not present

## 2018-10-17 DIAGNOSIS — Z85828 Personal history of other malignant neoplasm of skin: Secondary | ICD-10-CM | POA: Diagnosis not present

## 2018-10-17 DIAGNOSIS — L812 Freckles: Secondary | ICD-10-CM | POA: Diagnosis not present

## 2018-10-17 DIAGNOSIS — L821 Other seborrheic keratosis: Secondary | ICD-10-CM | POA: Diagnosis not present

## 2018-10-17 DIAGNOSIS — Z1283 Encounter for screening for malignant neoplasm of skin: Secondary | ICD-10-CM | POA: Diagnosis not present

## 2018-10-17 DIAGNOSIS — D485 Neoplasm of uncertain behavior of skin: Secondary | ICD-10-CM | POA: Diagnosis not present

## 2018-10-17 DIAGNOSIS — D229 Melanocytic nevi, unspecified: Secondary | ICD-10-CM | POA: Diagnosis not present

## 2018-10-17 DIAGNOSIS — L578 Other skin changes due to chronic exposure to nonionizing radiation: Secondary | ICD-10-CM | POA: Diagnosis not present

## 2018-10-17 DIAGNOSIS — D225 Melanocytic nevi of trunk: Secondary | ICD-10-CM | POA: Diagnosis not present

## 2018-11-05 ENCOUNTER — Ambulatory Visit (INDEPENDENT_AMBULATORY_CARE_PROVIDER_SITE_OTHER): Payer: Medicare HMO | Admitting: Family Medicine

## 2018-11-05 ENCOUNTER — Encounter: Payer: Self-pay | Admitting: Family Medicine

## 2018-11-05 VITALS — BP 138/85 | HR 67 | Temp 98.1°F | Wt 252.0 lb

## 2018-11-05 DIAGNOSIS — Z6834 Body mass index (BMI) 34.0-34.9, adult: Secondary | ICD-10-CM | POA: Diagnosis not present

## 2018-11-05 DIAGNOSIS — R7989 Other specified abnormal findings of blood chemistry: Secondary | ICD-10-CM

## 2018-11-05 DIAGNOSIS — I10 Essential (primary) hypertension: Secondary | ICD-10-CM

## 2018-11-05 DIAGNOSIS — E782 Mixed hyperlipidemia: Secondary | ICD-10-CM | POA: Diagnosis not present

## 2018-11-05 DIAGNOSIS — E669 Obesity, unspecified: Secondary | ICD-10-CM | POA: Diagnosis not present

## 2018-11-05 MED ORDER — AMLODIPINE BESYLATE 10 MG PO TABS: 10.0000 mg | ORAL_TABLET | Freq: Every day | ORAL | 3 refills | Status: DC

## 2018-11-05 NOTE — Assessment & Plan Note (Signed)
Cholesterol elevated at last visit Started Crestor 10mg  daily Tolerating well Will recheck CMP and FLP

## 2018-11-05 NOTE — Progress Notes (Signed)
Patient: Tyler [name] Male    DOB: [birth year]   68 y.o.   MRN: [id number] Visit Date: 11/05/2018  Today's Provider: Lavon Paganini, MD   Chief Complaint  Patient presents with  . Hyperlipidemia  . Hypertension   Subjective:    I, Tiburcio Pea, CMA, am acting as a Education administrator for Lavon Paganini, MD.   HPI  Lipid/Cholesterol, Follow-up:   Last seen for this 2 months ago.  Management changes since that visit include no changes. . Last Lipid Panel:    Component Value Date/Time   CHOL 277 (H) 07/23/2018 0834   TRIG 273 (H) 07/23/2018 0834   HDL 41 07/23/2018 0834   CHOLHDL 6.8 (H) 07/23/2018 0834   LDLCALC 181 (H) 07/23/2018 6283    Risk factors for vascular disease include hypercholesterolemia and hypertension  He reports good compliance with treatment. He is not having side effects.  Current symptoms include none  Weight trend: stable Current diet: in general, an "unhealthy" diet at this time due to house being remodeled and kitchen not being available. Current exercise: plays golf twice a week  Wt Readings from Last 3 Encounters:  11/05/18 252 lb (114.3 kg)  10/09/18 242 lb (109.8 kg)  09/03/18 252 lb 12.8 oz (114.7 kg)    -------------------------------------------------------------------  Hypertension, follow-up:  BP Readings from Last 3 Encounters:  11/05/18 138/85  10/09/18 (!) 147/83  09/03/18 139/89    He was last seen for hypertension 2 months ago.  BP at that visit was 139/89. Management changes since that visit include no changes. He reports good compliance with treatment. He is not having side effects.  He is not exercising regularly, but plays golf twice a week. He is not adherent to low salt diet.   Outside blood pressures are not being checked at home. He is experiencing none.  Patient denies chest pain, chest pressure/discomfort, claudication, dyspnea, exertional chest pressure/discomfort, fatigue, irregular heart beat, lower  extremity edema, near-syncope, orthopnea, palpitations, paroxysmal nocturnal dyspnea, syncope and tachypnea.   Cardiovascular risk factors include advanced age (older than 74 for men, 54 for women), dyslipidemia, hypertension, male gender and obesity (BMI >= 30 kg/m2).  Use of agents associated with hypertension: none.     Weight trend: stable Wt Readings from Last 3 Encounters:  11/05/18 252 lb (114.3 kg)  10/09/18 242 lb (109.8 kg)  09/03/18 252 lb 12.8 oz (114.7 kg)   ------------------------------------------------------------------------  No Known Allergies   Current Outpatient Medications:  .  amLODipine (NORVASC) 10 MG tablet, Take 1 tablet (10 mg total) by mouth daily., Disp: 30 tablet, Rfl: 3 .  rosuvastatin (CRESTOR) 10 MG tablet, Take 1 tablet (10 mg total) by mouth daily., Disp: 90 tablet, Rfl: 3 .  sildenafil (VIAGRA) 100 MG tablet, Take by mouth., Disp: , Rfl:   Review of Systems  Constitutional: Negative.   Respiratory: Negative.   Cardiovascular: Negative.   Musculoskeletal: Negative.     Social History   Tobacco Use  . Smoking status: Never Smoker  . Smokeless tobacco: Never Used  Substance Use Topics  . Alcohol use: Yes    Alcohol/week: 3.0 standard drinks    Types: 3 Cans of beer per week      Objective:   BP 138/85 (BP Location: Right Arm, Patient Position: Sitting, Cuff Size: Large)   Pulse 67   Temp 98.1 F (36.7 C) (Oral)   Wt 252 lb (114.3 kg)   SpO2 96%   BMI 34.18 kg/m  Vitals:   11/05/18 0834  BP: 138/85  Pulse: 67  Temp: 98.1 F (36.7 C)  TempSrc: Oral  SpO2: 96%  Weight: 252 lb (114.3 kg)     Physical Exam Vitals signs reviewed.  Constitutional:      General: He is not in acute distress.    Appearance: Normal appearance. He is not diaphoretic.  HENT:     Head: Normocephalic and atraumatic.  Eyes:     General: No scleral icterus.    Conjunctiva/sclera: Conjunctivae normal.  Neck:     Musculoskeletal: Neck supple.    Cardiovascular:     Rate and Rhythm: Normal rate and regular rhythm.     Pulses: Normal pulses.     Heart sounds: Normal heart sounds. No murmur.  Pulmonary:     Effort: Pulmonary effort is normal. No respiratory distress.     Breath sounds: Normal breath sounds. No wheezing or rhonchi.  Musculoskeletal:     Right lower leg: No edema.     Left lower leg: No edema.  Lymphadenopathy:     Cervical: No cervical adenopathy.  Skin:    General: Skin is warm and dry.     Capillary Refill: Capillary refill takes less than 2 seconds.     Findings: No rash.  Neurological:     Mental Status: He is alert and oriented to person, place, and time. Mental status is at baseline.  Psychiatric:        Mood and Affect: Mood normal.        Behavior: Behavior normal.         Assessment & Plan   Problem List Items Addressed This Visit      Cardiovascular and Mediastinum   Essential hypertension - Primary    Well controlled Continue current medications Recheck metabolic panel F/u in 6 months       Relevant Medications   amLODipine (NORVASC) 10 MG tablet   Other Relevant Orders   Comprehensive metabolic panel     Other   Hyperlipidemia    Cholesterol elevated at last visit Started Crestor 10mg  daily Tolerating well Will recheck CMP and FLP      Relevant Medications   amLODipine (NORVASC) 10 MG tablet   Other Relevant Orders   Comprehensive metabolic panel   Lipid panel   Obesity    Discussed diet and exercise       Other Visit Diagnoses    Elevated serum creatinine       Relevant Orders   Comprehensive metabolic panel       Return in about 6 months (around 05/06/2019) for chronic disease f/u.   The entirety of the information documented in the History of Present Illness, Review of Systems and Physical Exam were personally obtained by me. Portions of this information were initially documented by Tiburcio Pea, CMA and reviewed by me for thoroughness and accuracy.     Virginia Crews, MD, MPH [facility] 11/05/2018 8:52 AM

## 2018-11-05 NOTE — Patient Instructions (Signed)

## 2018-11-05 NOTE — Assessment & Plan Note (Signed)
Well controlled Continue current medications Recheck metabolic panel F/u in 6 months  

## 2018-11-05 NOTE — Assessment & Plan Note (Signed)
Discussed diet and exercise 

## 2018-11-06 DIAGNOSIS — E782 Mixed hyperlipidemia: Secondary | ICD-10-CM | POA: Diagnosis not present

## 2018-11-06 DIAGNOSIS — R7989 Other specified abnormal findings of blood chemistry: Secondary | ICD-10-CM | POA: Diagnosis not present

## 2018-11-06 DIAGNOSIS — I10 Essential (primary) hypertension: Secondary | ICD-10-CM | POA: Diagnosis not present

## 2018-11-07 LAB — COMPREHENSIVE METABOLIC PANEL
ALT: 19 IU/L (ref 0–44)
AST: 18 IU/L (ref 0–40)
Albumin/Globulin Ratio: 1.5 (ref 1.2–2.2)
Albumin: 4.6 g/dL (ref 3.8–4.8)
Alkaline Phosphatase: 71 IU/L (ref 39–117)
BUN/Creatinine Ratio: 10 (ref 10–24)
BUN: 14 mg/dL (ref 8–27)
Bilirubin Total: 0.5 mg/dL (ref 0.0–1.2)
CO2: 23 mmol/L (ref 20–29)
Calcium: 9.6 mg/dL (ref 8.6–10.2)
Chloride: 99 mmol/L (ref 96–106)
Creatinine, Ser: 1.44 mg/dL — ABNORMAL HIGH (ref 0.76–1.27)
GFR calc Af Amer: 58 mL/min/{1.73_m2} — ABNORMAL LOW (ref >59)
GFR calc non Af Amer: 51 mL/min/{1.73_m2} — ABNORMAL LOW (ref >59)
Globulin, Total: 3.1 g/dL (ref 1.5–4.5)
Glucose: 101 mg/dL — ABNORMAL HIGH (ref 65–99)
Potassium: 4.5 mmol/L (ref 3.5–5.2)
Sodium: 141 mmol/L (ref 134–144)
Total Protein: 7.7 g/dL (ref 6.0–8.5)

## 2018-11-07 LAB — LIPID PANEL
Chol/HDL Ratio: 4.8 ratio (ref 0.0–5.0)
Cholesterol, Total: 190 mg/dL (ref 100–199)
HDL: 40 mg/dL (ref >39)
LDL Calculated: 107 mg/dL — ABNORMAL HIGH (ref 0–99)
Triglycerides: 217 mg/dL — ABNORMAL HIGH (ref 0–149)
VLDL Cholesterol Cal: 43 mg/dL — ABNORMAL HIGH (ref 5–40)

## 2018-11-08 ENCOUNTER — Telehealth: Payer: Self-pay

## 2018-11-08 MED ORDER — ROSUVASTATIN CALCIUM 20 MG PO TABS: 20.0000 mg | ORAL_TABLET | Freq: Every day | ORAL | 3 refills | Status: DC

## 2018-11-08 NOTE — Telephone Encounter (Signed)
-----   Message from Virginia Crews, MD sent at 11/08/2018 10:18 AM EST ----- Normal liver function and electrolytes.  Cholesterol has improved, but not quite to goal.  Would increase Crestor to 20mg  daily.  Ok to send 90 day supply with 3 refills of new dose if patient agrees.  Kidney function has worsened some over last 3 months.  Is he taking NSAIDs or any supplements?  Staying well hydrated?

## 2018-11-08 NOTE — Telephone Encounter (Signed)
Patient advised. [facility]. Patient agreed to avoid NSAIDS and increase fluid intake.

## 2018-11-23 ENCOUNTER — Other Ambulatory Visit: Payer: Self-pay

## 2018-11-23 MED ORDER — ROSUVASTATIN CALCIUM 20 MG PO TABS: 20.0000 mg | ORAL_TABLET | Freq: Every day | ORAL | 3 refills | Status: DC

## 2018-11-27 DIAGNOSIS — Z85819 Personal history of malignant neoplasm of unspecified site of lip, oral cavity, and pharynx: Secondary | ICD-10-CM | POA: Diagnosis not present

## 2018-11-27 DIAGNOSIS — K0889 Other specified disorders of teeth and supporting structures: Secondary | ICD-10-CM | POA: Diagnosis not present

## 2019-04-17 DIAGNOSIS — D2230 Melanocytic nevi of unspecified part of face: Secondary | ICD-10-CM | POA: Diagnosis not present

## 2019-04-17 DIAGNOSIS — D225 Melanocytic nevi of trunk: Secondary | ICD-10-CM | POA: Diagnosis not present

## 2019-04-17 DIAGNOSIS — L821 Other seborrheic keratosis: Secondary | ICD-10-CM | POA: Diagnosis not present

## 2019-04-17 DIAGNOSIS — Z86018 Personal history of other benign neoplasm: Secondary | ICD-10-CM | POA: Diagnosis not present

## 2019-04-17 DIAGNOSIS — L578 Other skin changes due to chronic exposure to nonionizing radiation: Secondary | ICD-10-CM | POA: Diagnosis not present

## 2019-04-17 DIAGNOSIS — D229 Melanocytic nevi, unspecified: Secondary | ICD-10-CM | POA: Diagnosis not present

## 2019-04-17 DIAGNOSIS — D1800 Hemangioma unspecified site: Secondary | ICD-10-CM | POA: Diagnosis not present

## 2019-04-17 DIAGNOSIS — L814 Other melanin hyperpigmentation: Secondary | ICD-10-CM | POA: Diagnosis not present

## 2019-04-17 DIAGNOSIS — Z1283 Encounter for screening for malignant neoplasm of skin: Secondary | ICD-10-CM | POA: Diagnosis not present

## 2019-04-17 DIAGNOSIS — Z85828 Personal history of other malignant neoplasm of skin: Secondary | ICD-10-CM | POA: Diagnosis not present

## 2019-05-06 ENCOUNTER — Encounter: Payer: Self-pay | Admitting: Family Medicine

## 2019-05-06 ENCOUNTER — Ambulatory Visit (INDEPENDENT_AMBULATORY_CARE_PROVIDER_SITE_OTHER): Payer: Medicare HMO | Admitting: Family Medicine

## 2019-05-06 VITALS — BP 160/94 | HR 58 | Temp 97.7°F | Resp 16 | Ht 72.0 in | Wt 258.0 lb

## 2019-05-06 DIAGNOSIS — Z6834 Body mass index (BMI) 34.0-34.9, adult: Secondary | ICD-10-CM

## 2019-05-06 DIAGNOSIS — E669 Obesity, unspecified: Secondary | ICD-10-CM

## 2019-05-06 DIAGNOSIS — I10 Essential (primary) hypertension: Secondary | ICD-10-CM

## 2019-05-06 DIAGNOSIS — E782 Mixed hyperlipidemia: Secondary | ICD-10-CM

## 2019-05-06 MED ORDER — SILDENAFIL CITRATE 100 MG PO TABS: 50.0000 mg | ORAL_TABLET | ORAL | 5 refills | Status: DC | PRN

## 2019-05-06 MED ORDER — HYDROCHLOROTHIAZIDE 12.5 MG PO TABS: 12.5000 mg | ORAL_TABLET | Freq: Every day | ORAL | 3 refills | Status: DC

## 2019-05-06 NOTE — Assessment & Plan Note (Signed)
Discussed importance of healthy weight management Discussed diet and exercise  

## 2019-05-06 NOTE — Progress Notes (Signed)
Patient: Tyler [name] Male    DOB: 26-Aug-1951   68 y.o.   MRN: [id number] Visit Date: 05/06/2019  Today's Provider: Lavon Paganini, MD   Chief Complaint  Patient presents with  . Follow-up  . Hypertension  . Hyperlipidemia   Subjective:     HPI    Hypertension, follow-up:  BP Readings from Last 3 Encounters:  05/06/19 (!) 160/94  11/05/18 138/85  10/09/18 (!) 147/83    He was last seen for hypertension 6 months ago.  BP at that visit was 138/86. Management since that visit includes; no changes.He reports good compliance with treatment. He is not having side effects. none He is not exercising. He is not adherent to low salt diet.   Outside blood pressures are not checking. He is experiencing none.  Patient denies none.   Cardiovascular risk factors include advanced age (older than 16 for men, 22 for women).  Use of agents associated with hypertension: none.   ----------------------------------------------    Lipid/Cholesterol, Follow-up:   Last seen for this 6 months ago.  Management since that visit includes; no changes.  Last Lipid Panel:    Component Value Date/Time   CHOL 190 11/06/2018 0822   TRIG 217 (H) 11/06/2018 0822   HDL 40 11/06/2018 0822   CHOLHDL 4.8 11/06/2018 0822   LDLCALC 107 (H) 11/06/2018 2094    He reports good compliance with treatment. He is not having side effects. none  Wt Readings from Last 3 Encounters:  05/06/19 258 lb (117 kg)  11/05/18 252 lb (114.3 kg)  10/09/18 242 lb (109.8 kg)     No Known Allergies   Current Outpatient Medications:  .  amLODipine (NORVASC) 10 MG tablet, Take 1 tablet (10 mg total) by mouth daily., Disp: 90 tablet, Rfl: 3 .  rosuvastatin (CRESTOR) 20 MG tablet, Take 1 tablet (20 mg total) by mouth daily., Disp: 90 tablet, Rfl: 3 .  sildenafil (VIAGRA) 100 MG tablet, Take by mouth., Disp: , Rfl:   Review of Systems  Constitutional: Negative for appetite change, chills and fever.   Respiratory: Negative for chest tightness, shortness of breath and wheezing.   Cardiovascular: Negative for chest pain and palpitations.  Gastrointestinal: Negative for abdominal pain, nausea and vomiting.    Social History   Tobacco Use  . Smoking status: Never Smoker  . Smokeless tobacco: Never Used  Substance Use Topics  . Alcohol use: Yes    Alcohol/week: 3.0 standard drinks    Types: 3 Cans of beer per week      Objective:   BP (!) 160/94 (BP Location: Left Arm, Patient Position: Sitting, Cuff Size: Large)   Pulse (!) 58   Temp 97.7 F (36.5 C) (Temporal)   Resp 16   Ht 6' (1.829 m)   Wt 258 lb (117 kg)   SpO2 97%   BMI 34.99 kg/m  Vitals:   05/06/19 0815  BP: (!) 160/94  Pulse: (!) 58  Resp: 16  Temp: 97.7 F (36.5 C)  TempSrc: Temporal  SpO2: 97%  Weight: 258 lb (117 kg)  Height: 6' (1.829 m)     Physical Exam Vitals signs reviewed.  Constitutional:      General: He is not in acute distress.    Appearance: Normal appearance. He is not diaphoretic.  HENT:     Head: Normocephalic and atraumatic.  Eyes:     General: No scleral icterus.    Conjunctiva/sclera: Conjunctivae normal.  Neck:  Musculoskeletal: Neck supple.  Cardiovascular:     Rate and Rhythm: Normal rate and regular rhythm.     Pulses: Normal pulses.     Heart sounds: Normal heart sounds. No murmur.  Pulmonary:     Effort: Pulmonary effort is normal. No respiratory distress.     Breath sounds: Normal breath sounds. No wheezing or rhonchi.  Abdominal:     General: There is no distension.     Palpations: Abdomen is soft.     Tenderness: There is no abdominal tenderness.  Musculoskeletal:     Right lower leg: No edema.     Left lower leg: No edema.  Lymphadenopathy:     Cervical: No cervical adenopathy.  Skin:    General: Skin is warm and dry.     Capillary Refill: Capillary refill takes less than 2 seconds.     Findings: No rash.  Neurological:     Mental Status: He is alert  and oriented to person, place, and time.     Cranial Nerves: No cranial nerve deficit.  Psychiatric:        Mood and Affect: Mood normal.        Behavior: Behavior normal.      No results found for any visits on 05/06/19.     Assessment & Plan   Problem List Items Addressed This Visit      Cardiovascular and Mediastinum   Essential hypertension - Primary    Uncontrolled Continue Amlodipine 10mg  daily and start HCTZ 12.5mg  daily Discussed possible side effects Recheck metabolic panel F/u in 1 month      Relevant Medications   sildenafil (VIAGRA) 100 MG tablet   hydrochlorothiazide (HYDRODIURIL) 12.5 MG tablet   Other Relevant Orders   Comprehensive metabolic panel     Other   Hyperlipidemia    Previously elevated and crestor increased to 20mg  daily Recheck CMP and FLP      Relevant Medications   sildenafil (VIAGRA) 100 MG tablet   hydrochlorothiazide (HYDRODIURIL) 12.5 MG tablet   Other Relevant Orders   Comprehensive metabolic panel   Lipid panel   Obesity    Discussed importance of healthy weight management Discussed diet and exercise       Relevant Orders   Comprehensive metabolic panel   Lipid panel       Return in about 4 weeks (around 06/03/2019) for BP f/u.   The entirety of the information documented in the History of Present Illness, Review of Systems and Physical Exam were personally obtained by me. Portions of this information were initially documented by April Miller, CMA and reviewed by me for thoroughness and accuracy.    Bacigalupo, Dionne Bucy, MD MPH [facility]

## 2019-05-06 NOTE — Assessment & Plan Note (Signed)
Previously elevated and crestor increased to 20mg  daily Recheck CMP and FLP

## 2019-05-06 NOTE — Assessment & Plan Note (Addendum)
Uncontrolled Continue Amlodipine 10mg  daily and start HCTZ 12.5mg  daily Discussed possible side effects Recheck metabolic panel F/u in 1 month

## 2019-05-06 NOTE — Patient Instructions (Signed)
Blood Pressure Record Sheet To take your blood pressure, you will need a blood pressure machine. You can buy a blood pressure machine (blood pressure monitor) at your clinic, drug store, or online. When choosing one, consider:  An automatic monitor that has an arm cuff.  A cuff that wraps snugly around your upper arm. You should be able to fit only one finger between your arm and the cuff.  A device that stores blood pressure reading results.  Do not choose a monitor that measures your blood pressure from your wrist or finger. Follow your health care provider's instructions for how to take your blood pressure. To use this form:  Get one reading in the morning (a.m.) before you take any medicines.  Get one reading in the evening (p.m.) before supper.  Take at least 2 readings with each blood pressure check. This makes sure the results are correct. Wait 1-2 minutes between measurements.  Write down the results in the spaces on this form.  Repeat this once a week, or as told by your health care provider.  Make a follow-up appointment with your health care provider to discuss the results. Blood pressure log Date: _______________________  a.m. _____________________(1st reading) _____________________(2nd reading)  p.m. _____________________(1st reading) _____________________(2nd reading) Date: _______________________  a.m. _____________________(1st reading) _____________________(2nd reading)  p.m. _____________________(1st reading) _____________________(2nd reading) Date: _______________________  a.m. _____________________(1st reading) _____________________(2nd reading)  p.m. _____________________(1st reading) _____________________(2nd reading) Date: _______________________  a.m. _____________________(1st reading) _____________________(2nd reading)  p.m. _____________________(1st reading) _____________________(2nd reading) Date: _______________________  a.m.  _____________________(1st reading) _____________________(2nd reading)  p.m. _____________________(1st reading) _____________________(2nd reading) This information is not intended to replace advice given to you by your health care provider. Make sure you discuss any questions you have with your health care provider. Document Released: 06/04/2003 Document Revised: 11/03/2017 Document Reviewed: 09/05/2017 Elsevier Patient Education  2020 [employer]  

## 2019-05-07 ENCOUNTER — Telehealth: Payer: Self-pay

## 2019-05-07 LAB — LIPID PANEL
Chol/HDL Ratio: 4.2 ratio (ref 0.0–5.0)
Cholesterol, Total: 175 mg/dL (ref 100–199)
HDL: 42 mg/dL (ref >39)
LDL Calculated: 85 mg/dL (ref 0–99)
Triglycerides: 239 mg/dL — ABNORMAL HIGH (ref 0–149)
VLDL Cholesterol Cal: 48 mg/dL — ABNORMAL HIGH (ref 5–40)

## 2019-05-07 LAB — COMPREHENSIVE METABOLIC PANEL
ALT: 22 IU/L (ref 0–44)
AST: 18 IU/L (ref 0–40)
Albumin/Globulin Ratio: 1.4 (ref 1.2–2.2)
Albumin: 4.4 g/dL (ref 3.8–4.8)
Alkaline Phosphatase: 61 IU/L (ref 39–117)
BUN/Creatinine Ratio: 9 — ABNORMAL LOW (ref 10–24)
BUN: 11 mg/dL (ref 8–27)
Bilirubin Total: 0.4 mg/dL (ref 0.0–1.2)
CO2: 21 mmol/L (ref 20–29)
Calcium: 9.3 mg/dL (ref 8.6–10.2)
Chloride: 103 mmol/L (ref 96–106)
Creatinine, Ser: 1.26 mg/dL (ref 0.76–1.27)
GFR calc Af Amer: 68 mL/min/{1.73_m2} (ref >59)
GFR calc non Af Amer: 59 mL/min/{1.73_m2} — ABNORMAL LOW (ref >59)
Globulin, Total: 3.1 g/dL (ref 1.5–4.5)
Glucose: 97 mg/dL (ref 65–99)
Potassium: 4.5 mmol/L (ref 3.5–5.2)
Sodium: 138 mmol/L (ref 134–144)
Total Protein: 7.5 g/dL (ref 6.0–8.5)

## 2019-05-07 NOTE — Telephone Encounter (Signed)
Patient was advised.  

## 2019-05-07 NOTE — Telephone Encounter (Signed)
-----   Message from Virginia Crews, MD sent at 05/07/2019 11:26 AM EDT ----- Normal labs

## 2019-05-21 ENCOUNTER — Telehealth: Payer: Self-pay | Admitting: Family Medicine

## 2019-05-21 NOTE — Telephone Encounter (Signed)
Pt went to pick up his new medications today.   They were not called in.  He doesn't know the names.  [id number] [address], [place] [phone] (Phone) [phone] (Fax)   Thanks, [employer]

## 2019-05-22 MED ORDER — AMLODIPINE BESYLATE 10 MG PO TABS: 10.0000 mg | ORAL_TABLET | Freq: Every day | ORAL | 3 refills | Status: DC

## 2019-05-22 MED ORDER — HYDROCHLOROTHIAZIDE 12.5 MG PO TABS: 12.5000 mg | ORAL_TABLET | Freq: Every day | ORAL | 3 refills | Status: DC

## 2019-05-22 MED ORDER — ROSUVASTATIN CALCIUM 20 MG PO TABS: 20.0000 mg | ORAL_TABLET | Freq: Every day | ORAL | 3 refills | Status: DC

## 2019-05-22 NOTE — Telephone Encounter (Signed)
Patient was advised.  

## 2019-05-22 NOTE — Telephone Encounter (Signed)
Medications were resent. Only new addition was HCTZ 12.5 mg daily.  Continue Crestor and amlodipine

## 2019-05-28 DIAGNOSIS — S8012XA Contusion of left lower leg, initial encounter: Secondary | ICD-10-CM | POA: Diagnosis not present

## 2019-05-28 DIAGNOSIS — Z23 Encounter for immunization: Secondary | ICD-10-CM | POA: Diagnosis not present

## 2019-05-28 DIAGNOSIS — L089 Local infection of the skin and subcutaneous tissue, unspecified: Secondary | ICD-10-CM | POA: Diagnosis not present

## 2019-06-07 ENCOUNTER — Ambulatory Visit: Payer: Self-pay | Admitting: Family Medicine

## 2019-06-17 NOTE — Progress Notes (Signed)
Patient: Tyler [name] Male    DOB: March 11, 1951   68 y.o.   MRN: [id number] Visit Date: 06/18/2019  Today's Provider: Lavon Paganini, MD   Chief Complaint  Patient presents with  . Hypertension   Subjective:    I, Tiburcio Pea, CMA, am acting as a Education administrator for Lavon Paganini, MD.    HPI   Hypertension, follow-up:  BP Readings from Last 3 Encounters:  06/18/19 (!) 158/84  05/06/19 (!) 160/94  11/05/18 138/85    He was last seen for hypertension 1 months ago.  BP at that visit was 160/94. Management since that visit includes Continue Amlodipine 10mg  daily and start HCTZ 12.5mg  daily. He reports good compliance with treatment. He is not having side effects.  He is exercising one day per week playing golf. He is not adherent to low salt diet.   Outside blood pressures are not being checked at home. He is experiencing none.  Patient denies chest pain, chest pressure/discomfort, claudication, dyspnea, exertional chest pressure/discomfort, fatigue, irregular heart beat, lower extremity edema, near-syncope, orthopnea, palpitations, paroxysmal nocturnal dyspnea, syncope and tachypnea.   Cardiovascular risk factors include advanced age (older than 22 for men, 58 for women), dyslipidemia, hypertension and male gender.    No Known Allergies   Current Outpatient Medications:  .  amLODipine (NORVASC) 10 MG tablet, Take 1 tablet (10 mg total) by mouth daily., Disp: 90 tablet, Rfl: 3 .  hydrochlorothiazide (HYDRODIURIL) 12.5 MG tablet, Take 1 tablet (12.5 mg total) by mouth daily., Disp: 30 tablet, Rfl: 3 .  rosuvastatin (CRESTOR) 20 MG tablet, Take 1 tablet (20 mg total) by mouth daily., Disp: 90 tablet, Rfl: 3 .  sildenafil (VIAGRA) 100 MG tablet, Take 0.5-1 tablets (50-100 mg total) by mouth as needed for erectile dysfunction., Disp: 10 tablet, Rfl: 5  Review of Systems  Constitutional: Negative.   Respiratory: Negative.   Cardiovascular: Negative.   Musculoskeletal:  Negative.     Social History   Tobacco Use  . Smoking status: Never Smoker  . Smokeless tobacco: Never Used  Substance Use Topics  . Alcohol use: Yes    Alcohol/week: 3.0 standard drinks    Types: 3 Cans of beer per week      Objective:   BP (!) 158/84 (BP Location: Right Arm, Patient Position: Sitting, Cuff Size: Large)   Pulse (!) 57   Temp (!) 96.8 F (36 C) (Temporal)   Wt 257 lb 9.6 oz (116.8 kg)   SpO2 97%   BMI 34.94 kg/m  Vitals:   06/18/19 1113  BP: (!) 158/84  Pulse: (!) 57  Temp: (!) 96.8 F (36 C)  TempSrc: Temporal  SpO2: 97%  Weight: 257 lb 9.6 oz (116.8 kg)  Body mass index is 34.94 kg/m.   Physical Exam Vitals signs reviewed.  Constitutional:      General: He is not in acute distress.    Appearance: Normal appearance. He is not diaphoretic.  HENT:     Head: Normocephalic and atraumatic.  Eyes:     General: No scleral icterus.    Conjunctiva/sclera: Conjunctivae normal.  Neck:     Musculoskeletal: Neck supple.  Cardiovascular:     Rate and Rhythm: Normal rate and regular rhythm.     Pulses: Normal pulses.     Heart sounds: Normal heart sounds. No murmur.  Pulmonary:     Effort: Pulmonary effort is normal. No respiratory distress.     Breath sounds: Normal breath sounds. No  wheezing or rhonchi.  Musculoskeletal:     Right lower leg: No edema.     Left lower leg: No edema.  Lymphadenopathy:     Cervical: No cervical adenopathy.  Skin:    General: Skin is warm and dry.     Capillary Refill: Capillary refill takes less than 2 seconds.     Findings: No rash.  Neurological:     Mental Status: He is alert and oriented to person, place, and time.     Cranial Nerves: No cranial nerve deficit.  Psychiatric:        Mood and Affect: Mood normal.        Behavior: Behavior normal.      No results found for any visits on 06/18/19.     Assessment & Plan   Problem List Items Addressed This Visit      Cardiovascular and Mediastinum    Essential hypertension - Primary    Uncontrolled Continue amlodipine at current dose Increase HCTZ to 25 mg daily Recheck metabolic panel at next visit Encouraged diet, exercise, weight loss Follow-up in 3 months      Relevant Medications   hydrochlorothiazide (HYDRODIURIL) 25 MG tablet   sildenafil (VIAGRA) 100 MG tablet     Other   Obesity    Discussed importance of healthy weight management Discussed diet and exercise           Return in about 3 months (around 09/17/2019) for chronic disease f/u.   The entirety of the information documented in the History of Present Illness, Review of Systems and Physical Exam were personally obtained by me. Portions of this information were initially documented by Tiburcio Pea, CMA and reviewed by me for thoroughness and accuracy.    Emmanuelle Coxe, Dionne Bucy, MD MPH [facility]

## 2019-06-18 ENCOUNTER — Other Ambulatory Visit: Payer: Self-pay

## 2019-06-18 ENCOUNTER — Encounter: Payer: Self-pay | Admitting: Family Medicine

## 2019-06-18 ENCOUNTER — Ambulatory Visit (INDEPENDENT_AMBULATORY_CARE_PROVIDER_SITE_OTHER): Payer: Medicare HMO | Admitting: Family Medicine

## 2019-06-18 VITALS — BP 150/85 | HR 57 | Temp 96.8°F | Wt 257.6 lb

## 2019-06-18 DIAGNOSIS — Z6834 Body mass index (BMI) 34.0-34.9, adult: Secondary | ICD-10-CM

## 2019-06-18 DIAGNOSIS — I10 Essential (primary) hypertension: Secondary | ICD-10-CM

## 2019-06-18 DIAGNOSIS — E669 Obesity, unspecified: Secondary | ICD-10-CM | POA: Diagnosis not present

## 2019-06-18 MED ORDER — HYDROCHLOROTHIAZIDE 25 MG PO TABS: 25.0000 mg | ORAL_TABLET | Freq: Every day | ORAL | 3 refills | Status: DC

## 2019-06-18 MED ORDER — SILDENAFIL CITRATE 100 MG PO TABS: 50.0000 mg | ORAL_TABLET | ORAL | 5 refills | Status: DC | PRN

## 2019-06-18 NOTE — Assessment & Plan Note (Signed)
Uncontrolled Continue amlodipine at current dose Increase HCTZ to 25 mg daily Recheck metabolic panel at next visit Encouraged diet, exercise, weight loss Follow-up in 3 months

## 2019-06-18 NOTE — Patient Instructions (Signed)

## 2019-06-18 NOTE — Assessment & Plan Note (Signed)
Discussed importance of healthy weight management Discussed diet and exercise  

## 2019-08-28 ENCOUNTER — Other Ambulatory Visit: Payer: Self-pay | Admitting: Family Medicine

## 2019-08-29 ENCOUNTER — Other Ambulatory Visit: Payer: Self-pay

## 2019-08-29 MED ORDER — HYDROCHLOROTHIAZIDE 25 MG PO TABS: 25.0000 mg | ORAL_TABLET | Freq: Every day | ORAL | 3 refills | Status: DC

## 2019-08-29 NOTE — Telephone Encounter (Signed)
Spoke with pt to inquire about telephonic AWV. Pt had to r/s CPE due to going out of town. AWV changed to telephonic visit and CPE scheduled 10/30/19 @ 10:20 AM.  Pt requested a refill on HCTZ 25 mg daily. Pt states he has been doing well on increased dose. Pt would like a refill sent to pharmacy on file #[id number]. Please advise, thanks.

## 2019-09-06 ENCOUNTER — Ambulatory Visit: Payer: Medicare HMO | Admitting: Family Medicine

## 2019-09-09 NOTE — Progress Notes (Signed)
Subjective:   Tyler [name] is a 68 y.o. male who presents for Medicare Annual/Subsequent preventive examination.    This visit is being conducted through telemedicine due to the COVID-19 pandemic. This patient has given me verbal consent via doximity to conduct this visit, patient states they are participating from their home address. Some vital signs may be absent or patient reported.    Patient identification: identified by name, DOB, and current address  Review of Systems:  N/A  Cardiac Risk Factors include: advanced age (>35men, >83 women);dyslipidemia;hypertension;male gender     Objective:    Vitals: There were no vitals taken for this visit.  There is no height or weight on file to calculate BMI. Unable to obtain vitals due to visit being conducted via telephonically.   Advanced Directives 09/10/2019 10/09/2018  Does Patient Have a Medical Advance Directive? Yes Yes  Type of Paramedic of [place];Living will [place];Living will  Copy of [place] in Chart? No - copy requested No - copy requested    Tobacco Social History   Tobacco Use  Smoking Status Never Smoker  Smokeless Tobacco Never Used     Counseling given: Not Answered   Clinical Intake:  Pre-visit preparation completed: Yes  Pain : No/denies pain Pain Score: 0-No pain     Nutritional Risks: None Diabetes: No  How often do you need to have someone help you when you read instructions, pamphlets, or other written materials from your doctor or pharmacy?: 1 - Never  Interpreter Needed?: No  Information entered by :: [facility], LPN  Past Medical History:  Diagnosis Date  . History of pilonidal cyst   . Hyperlipidemia   . Hypertension   . 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 COLONOSCOPY    . COLONOSCOPY WITH PROPOFOL N/A 10/09/2018   Procedure: COLONOSCOPY WITH PROPOFOL;  Surgeon: Lucilla Lame, MD;  Location: [facility] ENDOSCOPY;  Service: Endoscopy;  Laterality: N/A;  . KNEE ARTHROSCOPY Right   . NASAL SEPTUM SURGERY  2008  . NASAL SINUS SURGERY  2008  . PILONIDAL CYST EXCISION    . VASECTOMY     Family History  Problem Relation Age of Onset  . Heart disease Mother   . Hyperlipidemia Mother   . Hypertension Mother   . CVA Mother 75  . Leukemia Father   . Diabetes Brother   . Colon cancer Brother 69  . Healthy Daughter   . Healthy Daughter   . Colon cancer Paternal Uncle    Social History   Socioeconomic History  . Marital status: Married    Spouse name: Not on file  . Number of children: 2  . Years of education: Not on file  . Highest education level: Some college, no degree  Occupational History  . Occupation: retired Medical illustrator  Tobacco Use  . Smoking status: Never Smoker  . Smokeless tobacco: Never Used  Substance and Sexual Activity  . Alcohol use: Yes    Alcohol/week: 3.0 standard drinks    Types: 3 Cans of beer per week  . Drug use: Never  . Sexual activity: Yes    Partners: Female    Birth control/protection: Surgical  Other Topics Concern  . Not on file  Social History Narrative  . Not on file   Social Determinants of Health   Financial Resource Strain: Low Risk   .  Difficulty of Paying Living Expenses: Not hard at all  Food Insecurity: No Food Insecurity  . Worried About Charity fundraiser in the Last Year: Never true  . Ran Out of Food in the Last Year: Never true  Transportation Needs: No Transportation Needs  . Lack of Transportation (Medical): No  . Lack of Transportation (Non-Medical): No  Physical Activity: Inactive  . Days of Exercise per Week: 0 days  . Minutes of Exercise per Session: 0 min  Stress: No Stress Concern Present  . Feeling of Stress : Not at all  Social Connections: Not Isolated  . Frequency of Communication with Friends and Family: More than three  times a week  . Frequency of Social Gatherings with Friends and Family: Twice a week  . Attends Religious Services: More than 4 times per year  . Active Member of Clubs or Organizations: Yes  . Attends Archivist Meetings: More than 4 times per year  . Marital Status: Married    Outpatient Encounter Medications as of 09/10/2019  Medication Sig  . amLODipine (NORVASC) 10 MG tablet Take 1 tablet (10 mg total) by mouth daily.  . hydrochlorothiazide (HYDRODIURIL) 25 MG tablet Take 1 tablet (25 mg total) by mouth daily.  . rosuvastatin (CRESTOR) 20 MG tablet Take 1 tablet (20 mg total) by mouth daily.  . sildenafil (VIAGRA) 100 MG tablet Take 0.5-1 tablets (50-100 mg total) by mouth as needed for erectile dysfunction.   No facility-administered encounter medications on file as of 09/10/2019.    Activities of Daily Living In your present state of health, do you have any difficulty performing the following activities: 09/10/2019  Hearing? N  Vision? N  Difficulty concentrating or making decisions? N  Walking or climbing stairs? N  Dressing or bathing? N  Doing errands, shopping? N  Preparing Food and eating ? N  Using the Toilet? N  In the past six months, have you accidently leaked urine? N  Do you have problems with loss of bowel control? N  Managing your Medications? N  Managing your Finances? N  Housekeeping or managing your Housekeeping? N  Some recent data might be hidden    Patient Care Team: Virginia Crews, MD as PCP - General (Family Medicine) Ralene Bathe, MD (Dermatology)   Assessment:   This is a routine wellness examination for [name].  Exercise Activities and Dietary recommendations Current Exercise Habits: The patient does not participate in regular exercise at present(Plays golf once a week.), Exercise limited by: None identified  Goals    . DIET - REDUCE SUGAR INTAKE     Recommend to cut back on sugar foods and sweets to a couple times a  week versus daily.        Fall Risk: Fall Risk  09/10/2019 09/03/2018 07/19/2018  Falls in the past year? 0 0 No  Number falls in past yr: 0 - -  Injury with Fall? 0 - -    FALL RISK PREVENTION PERTAINING TO THE HOME:  Any stairs in or around the home? Yes  If so, are there any without handrails? No   Home free of loose throw rugs in walkways, pet beds, electrical cords, etc? Yes  Adequate lighting in your home to reduce risk of falls? Yes   ASSISTIVE DEVICES UTILIZED TO PREVENT FALLS:  Life alert? No  Use of a cane, walker or w/c? No  Grab bars in the bathroom? No  Shower chair or bench in shower? No  Elevated toilet seat or a handicapped toilet? No   TIMED UP AND GO:  Was the test performed? No .    Depression Screen PHQ 2/9 Scores 09/10/2019 09/10/2019 09/03/2018 07/19/2018  PHQ - 2 Score 0 0 0 0  PHQ- 9 Score - - 0 0    Cognitive Function: Declined today.      6CIT Screen 09/03/2018  What Year? 0 points  What month? 0 points  What time? 0 points  Count back from 20 0 points  Months in reverse 0 points  Repeat phrase 0 points  Total Score 0    Immunization History  Administered Date(s) Administered  . Influenza, High Dose Seasonal PF 07/19/2018, 05/28/2019  . Pneumococcal Conjugate-13 07/19/2018  . Td 03/04/2008  . Tdap 05/11/2015    Qualifies for Shingles Vaccine? Yes . Due for Shingrix. Pt has been advised to call insurance company to determine out of pocket expense. Advised may also receive vaccine at local pharmacy or Health Dept. Verbalized acceptance and understanding.  Tdap: Up to date  Flu Vaccine: Up to date  Pneumococcal Vaccine: Due for Pneumococcal vaccine. Does the patient want to receive this vaccine today?  No .   Screening Tests Health Maintenance  Topic Date Due  . PNA vac Low Risk Adult (2 of 2 - PPSV23) 07/20/2019  . COLONOSCOPY  10/10/2023  . TETANUS/TDAP  05/10/2025  . INFLUENZA VACCINE  Completed  . Hepatitis C  Screening  Completed   Cancer Screenings:  Colorectal Screening: Completed 10/09/18. Repeat every 5 years.  Lung Cancer Screening: (Low Dose CT Chest recommended if Age 75-80 years, 30 pack-year currently smoking OR have quit w/in 15years.) does not qualify.   Additional Screening:  Hepatitis C Screening: Up to date  Vision Screening: Recommended annual ophthalmology exams for early detection of glaucoma and other disorders of the eye.  Dental Screening: Recommended annual dental exams for proper oral hygiene  Community Resource Referral:  CRR required this visit?  No        Plan:  I have personally reviewed and addressed the Medicare Annual Wellness questionnaire and have noted the following in the patient's chart:  A. Medical and social history B. Use of alcohol, tobacco or illicit drugs  C. Current medications and supplements D. Functional ability and status E.  Nutritional status F.  Physical activity G. Advance directives H. List of other physicians I.  Hospitalizations, surgeries, and ER visits in previous 12 months J.  [place] such as hearing and vision if needed, cognitive and depression L. Referrals and appointments   In addition, I have reviewed and discussed with patient certain preventive protocols, quality metrics, and best practice recommendations. A written personalized care plan for preventive services as well as general preventive health recommendations were provided to patient.   [place], [place], [place]  [id number] Nurse Health Advisor   Nurse Notes: Pt to receive his P23 vaccine at next in office apt.

## 2019-09-10 ENCOUNTER — Other Ambulatory Visit: Payer: Self-pay

## 2019-09-10 ENCOUNTER — Ambulatory Visit (INDEPENDENT_AMBULATORY_CARE_PROVIDER_SITE_OTHER): Payer: Medicare HMO

## 2019-09-10 ENCOUNTER — Ambulatory Visit: Payer: Medicare HMO | Admitting: Family Medicine

## 2019-09-10 DIAGNOSIS — Z0000 Encounter for general adult medical examination without abnormal findings: Secondary | ICD-10-CM

## 2019-09-10 NOTE — Patient Instructions (Signed)
Tyler [name] , Thank you for taking time to come for your Medicare Wellness Visit. I appreciate your ongoing commitment to your health goals. Please review the following plan we discussed and let me know if I can assist you in the future.   Screening recommendations/referrals: Colonoscopy: Up to date, due 09/2023 Recommended yearly ophthalmology/optometry visit for glaucoma screening and checkup Recommended yearly dental visit for hygiene and checkup  Vaccinations: Influenza vaccine: Up to date Pneumococcal vaccine: P23 due. Pt to receive at next in office apt.  Tdap vaccine: Up to date, due 04/2025 Shingles vaccine: Pt declines today.     Advanced directives: Please bring a copy of your POA (Power of Attorney) and/or Living Will to your next appointment.   Conditions/risks identified: Recommend to cut back on sugar foods and sweets to a couple times a week versus daily.   Next appointment: 10/17/19 @ 9:40 AM with Dr Brita Romp. Declined scheduling an AWV for 2021 at this time.   Preventive Care 60 Years and Older, Male Preventive care refers to lifestyle choices and visits with your health care provider that can promote health and wellness. What does preventive care include?  A yearly physical exam. This is also called an annual well check.  Dental exams once or twice a year.  Routine eye exams. Ask your health care provider how often you should have your eyes checked.  Personal lifestyle choices, including:  Daily care of your teeth and gums.  Regular physical activity.  Eating a healthy diet.  Avoiding tobacco and drug use.  Limiting alcohol use.  Practicing safe sex.  Taking low doses of aspirin every day.  Taking vitamin and mineral supplements as recommended by your health care provider. What happens during an annual well check? The services and screenings done by your health care provider during your annual well check will depend on your age, overall health,  lifestyle risk factors, and family history of disease. Counseling  Your health care provider may ask you questions about your:  Alcohol use.  Tobacco use.  Drug use.  Emotional well-being.  Home and relationship well-being.  Sexual activity.  Eating habits.  History of falls.  Memory and ability to understand (cognition).  Work and work Statistician. Screening  You may have the following tests or measurements:  Height, weight, and BMI.  Blood pressure.  Lipid and cholesterol levels. These may be checked every 5 years, or more frequently if you are over 81 years old.  Skin check.  Lung cancer screening. You may have this screening every year starting at age 89 if you have a 30-pack-year history of smoking and currently smoke or have quit within the past 15 years.  Fecal occult blood test (FOBT) of the stool. You may have this test every year starting at age 17.  Flexible sigmoidoscopy or colonoscopy. You may have a sigmoidoscopy every 5 years or a colonoscopy every 10 years starting at age 31.  Prostate cancer screening. Recommendations will vary depending on your family history and other risks.  Hepatitis C blood test.  Hepatitis B blood test.  Sexually transmitted disease (STD) testing.  Diabetes screening. This is done by checking your blood sugar (glucose) after you have not eaten for a while (fasting). You may have this done every 1-3 years.  Abdominal aortic aneurysm (AAA) screening. You may need this if you are a current or former smoker.  Osteoporosis. You may be screened starting at age 39 if you are at high risk. Talk with your  health care provider about your test results, treatment options, and if necessary, the need for more tests. Vaccines  Your health care provider may recommend certain vaccines, such as:  Influenza vaccine. This is recommended every year.  Tetanus, diphtheria, and acellular pertussis (Tdap, Td) vaccine. You may need a Td booster  every 10 years.  Zoster vaccine. You may need this after age 73.  Pneumococcal 13-valent conjugate (PCV13) vaccine. One dose is recommended after age 30.  Pneumococcal polysaccharide (PPSV23) vaccine. One dose is recommended after age 63. Talk to your health care provider about which screenings and vaccines you need and how often you need them. This information is not intended to replace advice given to you by your health care provider. Make sure you discuss any questions you have with your health care provider. Document Released: 10/02/2015 Document Revised: 05/25/2016 Document Reviewed: 07/07/2015 Elsevier Interactive Patient Education  2017 [employer] Prevention in the Home Falls can cause injuries. They can happen to people of all ages. There are many things you can do to make your home safe and to help prevent falls. What can I do on the outside of my home?  Regularly fix the edges of walkways and driveways and fix any cracks.  Remove anything that might make you trip as you walk through a door, such as a raised step or threshold.  Trim any bushes or trees on the path to your home.  Use bright outdoor lighting.  Clear any walking paths of anything that might make someone trip, such as rocks or tools.  Regularly check to see if handrails are loose or broken. Make sure that both sides of any steps have handrails.  Any raised decks and porches should have guardrails on the edges.  Have any leaves, snow, or ice cleared regularly.  Use sand or salt on walking paths during winter.  Clean up any spills in your garage right away. This includes oil or grease spills. What can I do in the bathroom?  Use night lights.  Install grab bars by the toilet and in the tub and shower. Do not use towel bars as grab bars.  Use non-skid mats or decals in the tub or shower.  If you need to sit down in the shower, use a plastic, non-slip stool.  Keep the floor dry. Clean up any  water that spills on the floor as soon as it happens.  Remove soap buildup in the tub or shower regularly.  Attach bath mats securely with double-sided non-slip rug tape.  Do not have throw rugs and other things on the floor that can make you trip. What can I do in the bedroom?  Use night lights.  Make sure that you have a light by your bed that is easy to reach.  Do not use any sheets or blankets that are too big for your bed. They should not hang down onto the floor.  Have a firm chair that has side arms. You can use this for support while you get dressed.  Do not have throw rugs and other things on the floor that can make you trip. What can I do in the kitchen?  Clean up any spills right away.  Avoid walking on wet floors.  Keep items that you use a lot in easy-to-reach places.  If you need to reach something above you, use a strong step stool that has a grab bar.  Keep electrical cords out of the way.  Do not use  floor polish or wax that makes floors slippery. If you must use wax, use non-skid floor wax.  Do not have throw rugs and other things on the floor that can make you trip. What can I do with my stairs?  Do not leave any items on the stairs.  Make sure that there are handrails on both sides of the stairs and use them. Fix handrails that are broken or loose. Make sure that handrails are as long as the stairways.  Check any carpeting to make sure that it is firmly attached to the stairs. Fix any carpet that is loose or worn.  Avoid having throw rugs at the top or bottom of the stairs. If you do have throw rugs, attach them to the floor with carpet tape.  Make sure that you have a light switch at the top of the stairs and the bottom of the stairs. If you do not have them, ask someone to add them for you. What else can I do to help prevent falls?  Wear shoes that:  Do not have high heels.  Have rubber bottoms.  Are comfortable and fit you well.  Are closed  at the toe. Do not wear sandals.  If you use a stepladder:  Make sure that it is fully opened. Do not climb a closed stepladder.  Make sure that both sides of the stepladder are locked into place.  Ask someone to hold it for you, if possible.  Clearly [place] and make sure that you can see:  Any grab bars or handrails.  First and last steps.  Where the edge of each step is.  Use tools that help you move around (mobility aids) if they are needed. These include:  Canes.  Walkers.  Scooters.  Crutches.  Turn on the lights when you go into a dark area. Replace any light bulbs as soon as they burn out.  Set up your furniture so you have a clear path. Avoid moving your furniture around.  If any of your floors are uneven, fix them.  If there are any pets around you, be aware of where they are.  Review your medicines with your doctor. Some medicines can make you feel dizzy. This can increase your chance of falling. Ask your doctor what other things that you can do to help prevent falls. This information is not intended to replace advice given to you by your health care provider. Make sure you discuss any questions you have with your health care provider. Document Released: 07/02/2009 Document Revised: 02/11/2016 Document Reviewed: 10/10/2014 Elsevier Interactive Patient Education  2017 [employer].

## 2019-09-28 ENCOUNTER — Other Ambulatory Visit: Payer: Self-pay

## 2019-09-28 ENCOUNTER — Emergency Department: Payer: Medicare HMO

## 2019-09-28 DIAGNOSIS — Z8521 Personal history of malignant neoplasm of larynx: Secondary | ICD-10-CM | POA: Diagnosis not present

## 2019-09-28 DIAGNOSIS — I10 Essential (primary) hypertension: Secondary | ICD-10-CM | POA: Insufficient documentation

## 2019-09-28 DIAGNOSIS — K5900 Constipation, unspecified: Secondary | ICD-10-CM | POA: Insufficient documentation

## 2019-09-28 DIAGNOSIS — Z79899 Other long term (current) drug therapy: Secondary | ICD-10-CM | POA: Insufficient documentation

## 2019-09-28 DIAGNOSIS — N201 Calculus of ureter: Secondary | ICD-10-CM | POA: Diagnosis not present

## 2019-09-28 DIAGNOSIS — R109 Unspecified abdominal pain: Secondary | ICD-10-CM | POA: Diagnosis not present

## 2019-09-28 DIAGNOSIS — K8020 Calculus of gallbladder without cholecystitis without obstruction: Secondary | ICD-10-CM | POA: Insufficient documentation

## 2019-09-28 DIAGNOSIS — N200 Calculus of kidney: Secondary | ICD-10-CM | POA: Diagnosis not present

## 2019-09-28 DIAGNOSIS — R140 Abdominal distension (gaseous): Secondary | ICD-10-CM | POA: Diagnosis present

## 2019-09-28 NOTE — ED Triage Notes (Addendum)
Patient reports had episode last night with abdominal "bloating" that effecting urination (has sensation but does not feel like fully emptying his bladder).  States felt fine when he woke this morning.  Around 4 pm symptoms returned.  Reports has had slight change in stool, it has been firmer than usual.  States fell need to go today but was unable.

## 2019-09-29 ENCOUNTER — Emergency Department
Admission: EM | Admit: 2019-09-29 | Discharge: 2019-09-29 | Disposition: A | Discharge status: Home / Self Care | Payer: Medicare HMO | Attending: Emergency Medicine | Admitting: Emergency Medicine

## 2019-09-29 ENCOUNTER — Emergency Department: Payer: Medicare HMO

## 2019-09-29 ENCOUNTER — Encounter: Payer: Self-pay | Admitting: Radiology

## 2019-09-29 DIAGNOSIS — N200 Calculus of kidney: Secondary | ICD-10-CM | POA: Diagnosis not present

## 2019-09-29 DIAGNOSIS — K8020 Calculus of gallbladder without cholecystitis without obstruction: Secondary | ICD-10-CM

## 2019-09-29 DIAGNOSIS — K5900 Constipation, unspecified: Secondary | ICD-10-CM | POA: Diagnosis not present

## 2019-09-29 DIAGNOSIS — N201 Calculus of ureter: Secondary | ICD-10-CM | POA: Diagnosis not present

## 2019-09-29 LAB — COMPREHENSIVE METABOLIC PANEL
ALT: 28 U/L (ref 0–44)
AST: 24 U/L (ref 15–41)
Albumin: 4.4 g/dL (ref 3.5–5.0)
Alkaline Phosphatase: 58 U/L (ref 38–126)
Anion gap: 12 (ref 5–15)
BUN: 16 mg/dL (ref 8–23)
CO2: 26 mmol/L (ref 22–32)
Calcium: 9.5 mg/dL (ref 8.9–10.3)
Chloride: 101 mmol/L (ref 98–111)
Creatinine, Ser: 1.59 mg/dL — ABNORMAL HIGH (ref 0.61–1.24)
GFR calc Af Amer: 52 mL/min — ABNORMAL LOW (ref >60)
GFR calc non Af Amer: 45 mL/min — ABNORMAL LOW (ref >60)
Glucose, Bld: 126 mg/dL — ABNORMAL HIGH (ref 70–99)
Potassium: 3.8 mmol/L (ref 3.5–5.1)
Sodium: 139 mmol/L (ref 135–145)
Total Bilirubin: 0.9 mg/dL (ref 0.3–1.2)
Total Protein: 8.2 g/dL — ABNORMAL HIGH (ref 6.5–8.1)

## 2019-09-29 LAB — CBC
HCT: 47.4 % (ref 39.0–52.0)
Hemoglobin: 16.1 g/dL (ref 13.0–17.0)
MCH: 31.4 pg (ref 26.0–34.0)
MCHC: 34 g/dL (ref 30.0–36.0)
MCV: 92.4 fL (ref 80.0–100.0)
Platelets: 242 10*3/uL (ref 150–400)
RBC: 5.13 MIL/uL (ref 4.22–5.81)
RDW: 13 % (ref 11.5–15.5)
WBC: 8.9 10*3/uL (ref 4.0–10.5)
nRBC: 0 % (ref 0.0–0.2)

## 2019-09-29 LAB — URINALYSIS, COMPLETE (UACMP) WITH MICROSCOPIC
Bacteria, UA: NONE SEEN
Bilirubin Urine: NEGATIVE
Glucose, UA: NEGATIVE mg/dL
Ketones, ur: NEGATIVE mg/dL
Leukocytes,Ua: NEGATIVE
Nitrite: NEGATIVE
Protein, ur: 30 mg/dL — AB
Specific Gravity, Urine: 1.024 (ref 1.005–1.030)
pH: 5 (ref 5.0–8.0)

## 2019-09-29 LAB — LIPASE, BLOOD: Lipase: 34 U/L (ref 11–51)

## 2019-09-29 MED ORDER — IBUPROFEN 600 MG PO TABS
600.0000 mg | ORAL_TABLET | Freq: Once | ORAL | Status: AC
  Administered 2019-09-29: 600 mg via ORAL

## 2019-09-29 MED ORDER — LACTULOSE 10 GM/15ML PO SOLN
20.0000 g | Freq: Once | ORAL | Status: AC
  Administered 2019-09-29: 20 g via ORAL
  Filled 2019-09-29: qty 30

## 2019-09-29 MED ORDER — IOHEXOL 300 MG/ML  SOLN
100.0000 mL | Freq: Once | INTRAMUSCULAR | Status: AC | PRN
  Administered 2019-09-29: 100 mL via INTRAVENOUS

## 2019-09-29 NOTE — ED Provider Notes (Signed)
[facility] Emergency Department Provider Note  ____________________________________________   First MD Initiated Contact with Patient 09/29/19 [phone]     (approximate)  I have reviewed the triage vital signs and the nursing notes.   HISTORY  Chief Complaint "Abdominal Bloating"    HPI Tyler [name] is a 69 y.o. male with below list of previous medical conditions presents to the emergency department secondary to abdominal bloating discomfort and constipation last bowel movement day before yesterday.  Patient also admits to sensation that he is "not fully emptying my bladder".  Patient states that current pain score is 2 out of 10.  Patient does admit to nausea however no vomiting.  Patient denies any fever no cough.        Past Medical History:  Diagnosis Date  . History of pilonidal cyst   . Hyperlipidemia   . Hypertension   . 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 Special screening for malignant neoplasms, colon 10/09/2018  . Benign neoplasm of transverse colon   . Essential hypertension 07/19/2018  . Hyperlipidemia 07/19/2018  . History of laryngeal cancer 07/19/2018  . Obesity 07/19/2018    Past Surgical History:  Procedure Laterality Date  . COLONOSCOPY    . COLONOSCOPY WITH PROPOFOL N/A 10/09/2018   Procedure: COLONOSCOPY WITH PROPOFOL;  Surgeon: Lucilla Lame, MD;  Location: [facility] ENDOSCOPY;  Service: Endoscopy;  Laterality: N/A;  . KNEE ARTHROSCOPY Right   . NASAL SEPTUM SURGERY  2008  . NASAL SINUS SURGERY  2008  . PILONIDAL CYST EXCISION    . VASECTOMY      Prior to Admission medications   Medication Sig Start Date End Date Taking? Authorizing Provider  amLODipine (NORVASC) 10 MG tablet Take 1 tablet (10 mg total) by mouth daily. 05/22/19   Virginia Crews, MD  hydrochlorothiazide (HYDRODIURIL) 25 MG tablet Take 1 tablet (25 mg total) by  mouth daily. 08/29/19   Virginia Crews, MD  rosuvastatin (CRESTOR) 20 MG tablet Take 1 tablet (20 mg total) by mouth daily. 05/22/19   Virginia Crews, MD  sildenafil (VIAGRA) 100 MG tablet Take 0.5-1 tablets (50-100 mg total) by mouth as needed for erectile dysfunction. 06/18/19   Virginia Crews, MD    Allergies Patient has no known allergies.  Family History  Problem Relation Age of Onset  . Heart disease Mother   . Hyperlipidemia Mother   . Hypertension Mother   . CVA Mother 61  . Leukemia Father   . Diabetes Brother   . Colon cancer Brother 64  . Healthy Daughter   . Healthy Daughter   . Colon cancer Paternal Uncle     Social History Social History   Tobacco Use  . Smoking status: Never Smoker  . Smokeless tobacco: Never Used  Substance Use Topics  . Alcohol use: Yes    Alcohol/week: 3.0 standard drinks    Types: 3 Cans of beer per week  . Drug use: Never    Review of Systems Constitutional: No fever/chills Eyes: No visual changes. ENT: No sore throat. Cardiovascular: Denies chest pain. Respiratory: Denies shortness of breath. Gastrointestinal: Abdominal discomfort bloating and nausea, no vomiting.  No diarrhea.  No constipation. Genitourinary: Negative for dysuria. Musculoskeletal: Negative for neck pain.  Negative for back pain. Integumentary: Negative for rash. Neurological: Negative for headaches, focal weakness or numbness.   ____________________________________________  PHYSICAL EXAM:  VITAL SIGNS: ED Triage Vitals  Enc Vitals Group     BP 09/28/19 2127 (!) 149/95     Pulse Rate 09/28/19 2127 77     Resp 09/28/19 2127 17     Temp 09/28/19 2127 98.5 F (36.9 C)     Temp Source 09/28/19 2127 Oral     SpO2 09/28/19 2127 96 %     Weight 09/28/19 2124 110.7 kg (244 lb)     Height 09/28/19 2124 1.829 m (6')     Head Circumference --      Peak Flow --      Pain Score 09/28/19 2122 2     Pain Loc --      Pain Edu? --      Excl. in  [place]? --     Constitutional: Alert and oriented.  Eyes: Conjunctivae are normal.  Mouth/Throat: Patient is wearing a mask. Neck: No stridor.  No meningeal signs.   Cardiovascular: Normal rate, regular rhythm. Good peripheral circulation. Grossly normal heart sounds. Respiratory: Normal respiratory effort.  No retractions. Gastrointestinal: Right upper quadrant/right lower quadrant tenderness to palpation.  No distention.  Musculoskeletal: No lower extremity tenderness nor edema. No gross deformities of extremities. Neurologic:  Normal speech and language. No gross focal neurologic deficits are appreciated.  Skin:  Skin is warm, dry and intact. Psychiatric: Mood and affect are normal. Speech and behavior are normal.  ____________________________________________   LABS (all labs ordered are listed, but only abnormal results are displayed)  Labs Reviewed  URINALYSIS, COMPLETE (UACMP) WITH MICROSCOPIC - Abnormal; Notable for the following components:      Result Value   Color, Urine YELLOW (*)    APPearance HAZY (*)    Hgb urine dipstick MODERATE (*)    Protein, ur 30 (*)    All other components within normal limits  COMPREHENSIVE METABOLIC PANEL - Abnormal; Notable for the following components:   Glucose, Bld 126 (*)    Creatinine, Ser 1.59 (*)    Total Protein 8.2 (*)    GFR calc non Af Amer 45 (*)    GFR calc Af Amer 52 (*)    All other components within normal limits  CBC  LIPASE, BLOOD    ____________________________________________  RADIOLOGY I, [place] Demoni Parmar, personally viewed and evaluated these images (plain radiographs) as part of my medical decision making, as well as reviewing the written report by the radiologist.  ED MD interpretation: Moderate stool burden noted on abdominal x-ray.  Gallstone and right kidney stone at the UVJ noted on CT  Official radiology report(s): DG Abdomen 1 View  Result Date: 09/28/2019 CLINICAL DATA:  Abdominal pain, bloating  EXAM: ABDOMEN - 1 VIEW COMPARISON:  None. FINDINGS: The bowel gas pattern is normal. No radio-opaque calculi or other significant radiographic abnormality are seen. Moderate stool burden. IMPRESSION: Moderate stool burden.  No acute findings. Electronically Signed   By: Rolm Baptise M.D.   On: 09/28/2019 21:46   CT ABDOMEN PELVIS W CONTRAST  Result Date: 09/29/2019 CLINICAL DATA:  Abdominal bloating with dysuria. Right-sided pain. EXAM: CT ABDOMEN AND PELVIS WITH CONTRAST TECHNIQUE: Multidetector CT imaging of the abdomen and pelvis was performed using the standard protocol following bolus administration of intravenous contrast. CONTRAST:  122mL OMNIPAQUE IOHEXOL 300 MG/ML  SOLN COMPARISON:  None. FINDINGS: Lower chest: 8 mm pulmonary nodule noted right lower lobe on image 2/series 4. Hepatobiliary: The liver shows diffusely decreased attenuation suggesting fat deposition. 2.3 cm densely calcified gallstone  evident gallbladder is nondistended. No intrahepatic or extrahepatic biliary dilation. Pancreas: No focal mass lesion. No dilatation of the main duct. No intraparenchymal cyst. No peripancreatic edema. Spleen: No splenomegaly. No focal mass lesion. Adrenals/Urinary Tract: No adrenal nodule or mass. Mild right hydroureteronephrosis noted with decreased perfusion to the right kidney. Periureteric edema associated. No stones in the right kidney or ureter but there is a 2 mm stone in the right UVJ (axial image 85/series 2). Left kidney and ureter unremarkable. No stones free in the bladder lumen. Stomach/Bowel: Stomach is unremarkable. No gastric wall thickening. No evidence of outlet obstruction. Duodenum is normally positioned as is the ligament of Treitz. No small bowel wall thickening. No small bowel dilatation. The terminal ileum is normal. The appendix is best seen on coronal images and is unremarkable. No gross colonic mass. No colonic wall thickening. Vascular/Lymphatic: There is abdominal aortic  atherosclerosis without aneurysm. There is no gastrohepatic or hepatoduodenal ligament lymphadenopathy. No retroperitoneal or mesenteric lymphadenopathy. No pelvic sidewall lymphadenopathy. Reproductive: The prostate gland and seminal vesicles are unremarkable. Other: No intraperitoneal free fluid. Musculoskeletal: No worrisome lytic or sclerotic osseous abnormality. Tiny sclerotic foci in the right sacrum and right iliac crest are likely bone islands. IMPRESSION: 1. 2 mm right UVJ stone with mild secondary changes in the right kidney and ureter. 2. 8 mm right lower lobe pulmonary nodule. Non-contrast chest CT at 6-12 months is recommended. If the nodule is stable at time of repeat CT, then future CT at 18-24 months (from today's scan) is considered optional for low-risk patients, but is recommended for high-risk patients. This recommendation follows the consensus statement: Guidelines for Management of Incidental Pulmonary Nodules Detected on CT Images: From the Fleischner Society 2017; Radiology 2017; 284:228-243. 3. Cholelithiasis. 4. Hepatic steatosis. 5. Tiny sclerotic foci in the right sacrum and posterior right iliac crest. Patient has a remote history of laryngeal cancer and while these are likely benign, follow-up may be warranted to ensure stability. Short-term interval nuclear medicine bone scan could also be used to determine whether there is active bony turnover in these lesions. 6. Aortic Atherosclerosis (ICD10-I70.0). Electronically Signed   By: Misty Stanley M.D.   On: 09/29/2019 06:06    ____________________________________________   PROCEDURES   Procedure(s) performed (including Critical Care):  Procedures   ____________________________________________   INITIAL IMPRESSION / MDM / [place] / ED COURSE  As part of my medical decision making, I reviewed the following data within the electronic MEDICAL RECORD NUMBER   69 year old male presented with above-stated history and  physical exam with differential diagnosis including but not limited to cholelithiasis cholecystitis appendicitis diverticulitis constipation and ureterolithiasis.  CT scan revealed evidence of cholelithiasis without any findings consistent with cholecystitis and right ureterolithiasis with a 2 mm stone noted at the UVJ.  Spoke with the patient at length regarding all clinical findings.  Patient will be referred to Dr. Dahlia Byes general surgery regarding gallstones.  Patient is pain-free at present.  Patient will be given lactulose for constipation for home.      ____________________________________________  FINAL CLINICAL IMPRESSION(S) / ED DIAGNOSES  Final diagnoses:  Gallstones  Kidney stone  Constipation, unspecified constipation type     MEDICATIONS GIVEN DURING THIS VISIT:  Medications  lactulose (CHRONULAC) 10 GM/15ML solution 20 g (20 g Oral Given 09/29/19 0602)  iohexol (OMNIPAQUE) 300 MG/ML solution 100 mL (100 mLs Intravenous Contrast Given 09/29/19 0541)     ED Discharge Orders    None      *  Please note:  Tyler [name] was evaluated in Emergency Department on 09/29/2019 for the symptoms described in the history of present illness. He was evaluated in the context of the global COVID-19 pandemic, which necessitated consideration that the patient might be at risk for infection with the SARS-CoV-2 virus that causes COVID-19. Institutional protocols and algorithms that pertain to the evaluation of patients at risk for COVID-19 are in a state of rapid change based on information released by regulatory bodies including the CDC and federal and state organizations. These policies and algorithms were followed during the patient's care in the ED.  Some ED evaluations and interventions may be delayed as a result of limited staffing during the pandemic.*  Note:  This document was prepared using Dragon voice recognition software and may include unintentional dictation errors.   Gregor Hams, MD 09/29/19 [phone]

## 2019-09-29 NOTE — ED Notes (Signed)
Pt ambulated to bathroom at this time to urinate.

## 2019-09-29 NOTE — ED Notes (Signed)
Pt back from ct, resting in bed. VSS> NAD

## 2019-09-30 ENCOUNTER — Telehealth: Payer: Self-pay

## 2019-09-30 NOTE — Telephone Encounter (Signed)
I'm not sure what referral we are talking about.  Looks like ED referred him to Gen Surg for gallbladder.  If lactulose not helping, recommend trying magnesium citrate (whole bottle) and maybe an enema.  If he develops inability to keep anything down, may need to get looked at again to r/o bowel obstruction.

## 2019-09-30 NOTE — Telephone Encounter (Signed)
Copied from [place] [phone]. Topic: General - Other >> Sep 30, 2019 10:03 AM Leward Quan A wrote: Reason for CRM: Patient called to say that he was informed by [facility] ED that if he get a referral from his PCP he can return and be seen without waiting the long hours. Per patient he is constipated took lactulose (CHRONULAC) 10 GM/15ML solution 20 g as prescribed by the ED but have not yet moved his bowel since taking this medication at 7 PM on 09/29/19. Patient would like a call back from PCP please with some instructions. Can be reached at  Ph# [phone]

## 2019-09-30 NOTE — Telephone Encounter (Signed)
Patient advised as below.  

## 2019-10-02 ENCOUNTER — Ambulatory Visit (INDEPENDENT_AMBULATORY_CARE_PROVIDER_SITE_OTHER): Payer: Medicare HMO | Admitting: Surgery

## 2019-10-02 ENCOUNTER — Encounter: Payer: Self-pay | Admitting: Surgery

## 2019-10-02 ENCOUNTER — Other Ambulatory Visit: Payer: Self-pay

## 2019-10-02 VITALS — BP 151/90 | HR 64 | Temp 98.2°F | Ht 72.0 in | Wt 262.0 lb

## 2019-10-02 DIAGNOSIS — K8020 Calculus of gallbladder without cholecystitis without obstruction: Secondary | ICD-10-CM

## 2019-10-02 NOTE — Patient Instructions (Addendum)
We will schedule you for an Ultrasound of the abdomen to better assess your gallbladder.  You are scheduled for an Ultrasound of the abdomen at [facility], located across the street from our office. This is on 10/08/19 at 8:00 am. You will need to arrive there by 7:45 am and have nothing to eat or drink after midnight the night before.    We will have you follow up here after that in 2-3 weeks.      Cholelithiasis  Cholelithiasis is also called "gallstones." It is a kind of gallbladder disease. The gallbladder is an organ that stores a liquid (bile) that helps you digest fat. Gallstones may not cause symptoms (may be silent gallstones) until they cause a blockage, and then they can cause pain (gallbladder attack). Follow these instructions at home:  Take over-the-counter and prescription medicines only as told by your doctor.  Stay at a healthy weight.  Eat healthy foods. This includes: ? Eating fewer fatty foods, like fried foods. ? Eating fewer refined carbs (refined carbohydrates). Refined carbs are breads and grains that are highly processed, like white bread and white rice. Instead, choose whole grains like whole-wheat bread and brown rice. ? Eating more fiber. Almonds, fresh fruit, and beans are healthy sources of fiber.  Keep all follow-up visits as told by your doctor. This is important. Contact a doctor if:  You have sudden pain in the upper right side of your belly (abdomen). Pain might spread to your right shoulder or your chest. This may be a sign of a gallbladder attack.  You feel sick to your stomach (are nauseous).  You throw up (vomit).  You have been diagnosed with gallstones that have no symptoms and you get: ? Belly pain. ? Discomfort, burning, or fullness in the upper part of your belly (indigestion). Get help right away if:  You have sudden pain in the upper right side of your belly, and it lasts for more than 2 hours.  You have belly  pain that lasts for more than 5 hours.  You have a fever or chills.  You keep feeling sick to your stomach or you keep throwing up.  Your skin or the whites of your eyes turn yellow (jaundice).  You have dark-colored pee (urine).  You have light-colored poop (stool). Summary  Cholelithiasis is also called "gallstones."  The gallbladder is an organ that stores a liquid (bile) that helps you digest fat.  Silent gallstones are gallstones that do not cause symptoms.  A gallbladder attack may cause sudden pain in the upper right side of your belly. Pain might spread to your right shoulder or your chest. If this happens, contact your doctor.  If you have sudden pain in the upper right side of your belly that lasts for more than 2 hours, get help right away. This information is not intended to replace advice given to you by your health care provider. Make sure you discuss any questions you have with your health care provider. Document Revised: 08/18/2017 Document Reviewed: 05/22/2016 Elsevier Patient Education  2020 [employer].

## 2019-10-03 ENCOUNTER — Encounter: Payer: Self-pay | Admitting: Surgery

## 2019-10-03 NOTE — Progress Notes (Signed)
Patient ID: Tyler [name], male   DOB: 01-20-1951, 69 y.o.   MRN: [id number]  HPI Tyler [name] is a 69 y.o. male seen in consultation at the request of Dr. Owens Shark.  He went to the emergency room 3 days ago complaining of moderate to severe right upper quadrant pain that he was sharp.  No specific alleviating or aggravating factors. He also reports having some nausea but no vomiting.  No fevers no chills.  He also reports constipation.  He did have lactulose as well as magnesium citrate with good results.  He still has some persistent right upper quadrant pain.  CT scan of the abdomen and pelvis personally reviewed showing evidence of a large gallstone but without evidence of cholecystitis.  Normal biliary anatomy.  CMP was completely normal and CMP was only significant for an increased creatinine of 1.59 due to his chronic kidney disease.  He is able to perform more than 4 METS of activity without any shortness of breath or chest pain.  He does not have any specific alleviating or aggravating factors related to his symptoms.  HPI  Past Medical History:  Diagnosis Date  . History of pilonidal cyst   . Hyperlipidemia   . Hypertension   . 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 COLONOSCOPY    . COLONOSCOPY WITH PROPOFOL N/A 10/09/2018   Procedure: COLONOSCOPY WITH PROPOFOL;  Surgeon: Lucilla Lame, MD;  Location: [facility] ENDOSCOPY;  Service: Endoscopy;  Laterality: N/A;  . KNEE ARTHROSCOPY Right   . NASAL SEPTUM SURGERY  2008  . NASAL SINUS SURGERY  2008  . PILONIDAL CYST EXCISION    . VASECTOMY      Family History  Problem Relation Age of Onset  . Heart disease Mother   . Hyperlipidemia Mother   . Hypertension Mother   . CVA Mother 16  . Leukemia Father   . Diabetes Brother   . Colon cancer Brother 47  . Healthy Daughter   . Healthy Daughter   . Colon cancer Paternal Uncle     Social  History Social History   Tobacco Use  . Smoking status: Never Smoker  . Smokeless tobacco: Never Used  Substance Use Topics  . Alcohol use: Yes    Alcohol/week: 3.0 standard drinks    Types: 3 Cans of beer per week  . Drug use: Never    No Known Allergies  Current Outpatient Medications  Medication Sig Dispense Refill  . amLODipine (NORVASC) 10 MG tablet Take 1 tablet (10 mg total) by mouth daily. 90 tablet 3  . hydrochlorothiazide (HYDRODIURIL) 25 MG tablet Take 1 tablet (25 mg total) by mouth daily. 30 tablet 3  . rosuvastatin (CRESTOR) 20 MG tablet Take 1 tablet (20 mg total) by mouth daily. 90 tablet 3  . sildenafil (VIAGRA) 100 MG tablet Take 0.5-1 tablets (50-100 mg total) by mouth as needed for erectile dysfunction. 10 tablet 5   No current facility-administered medications for this visit.     Review of Systems Full ROS  was asked and was negative except for the information on the HPI  Physical Exam Blood pressure (!) 151/90, pulse 64, temperature 98.2 F (36.8 C), height 6' (1.829 m), weight 262 lb (118.8 kg), SpO2 96 %. CONSTITUTIONAL: NAD EYES: Pupils are equal, round, and reactive to light, Sclera are non-icteric. EARS, NOSE, MOUTH AND THROAT: wearing a mask. Hearing is intact  to voice. LYMPH NODES:  Lymph nodes in the neck are normal. RESPIRATORY:  Lungs are clear. There is normal respiratory effort, with equal breath sounds bilaterally, and without pathologic use of accessory muscles. CARDIOVASCULAR: Heart is regular without murmurs, gallops, or rubs. GI: The abdomen is  Soft, mild TTP RUQ, no murphy, no peritonitis.nondistended. There are no palpable masses. There is no hepatosplenomegaly. There are normal bowel sounds in all quadrants. GU: Rectal deferred.   MUSCULOSKELETAL: Normal muscle strength and tone. No cyanosis or edema.   SKIN: Turgor is good and there are no pathologic skin lesions or ulcers. NEUROLOGIC: Motor and sensation is grossly normal. Cranial  nerves are grossly intact. PSYCH:  Oriented to person, place and time. Affect is normal.  Data Reviewed  I have personally reviewed the patient's imaging, laboratory findings and medical records.    Assessment/Plan 69 year old male with abdominal pain located in the right upper quadrant questionable biliary colic versus constipation. Had a lengthy discussion with the patient and I cannot really exclude biliary colic.  I will like to obtain a ultrasound of the right upper quadrant and follow him in the next few weeks.  At that time we will determine what will be the best course of action.  Patient is inclined to go ahead and have his gallbladder removed.  Currently there is no need for any emergent surgical intervention at this time  Caroleen Hamman, MD [place] Surgeon 10/03/2019, 2:22 PM

## 2019-10-08 ENCOUNTER — Other Ambulatory Visit: Payer: Self-pay

## 2019-10-08 ENCOUNTER — Ambulatory Visit
Admission: RE | Admit: 2019-10-08 | Discharge: 2019-10-08 | Disposition: A | Discharge status: Home / Self Care | Payer: Medicare HMO | Source: Ambulatory Visit | Attending: Surgery | Admitting: Surgery

## 2019-10-08 DIAGNOSIS — K8020 Calculus of gallbladder without cholecystitis without obstruction: Secondary | ICD-10-CM | POA: Insufficient documentation

## 2019-10-08 DIAGNOSIS — K760 Fatty (change of) liver, not elsewhere classified: Secondary | ICD-10-CM | POA: Diagnosis not present

## 2019-10-09 ENCOUNTER — Telehealth: Payer: Self-pay

## 2019-10-09 NOTE — Telephone Encounter (Signed)
-----   Message from Jules Husbands, MD sent at 10/08/2019  6:42 PM EST ----- Please let her know he does have a large gallstone but nothing else. F/u w me ----- Message ----- From: Interface, Rad Results In Sent: 10/08/2019   8:27 AM EST To: Jules Husbands, MD

## 2019-10-09 NOTE — Telephone Encounter (Signed)
Spoke with patient to notify him of his recent results per Dr.Pabon "Please let her know he does have a large gallstone but nothing else. F/u w me." Patient states he scheduled for an appointment this upcoming Monday. Informed patient to keep scheduled appointment and his results will be discussed in depth, if he has any questions. Patient verbalized understanding.

## 2019-10-14 ENCOUNTER — Other Ambulatory Visit: Payer: Self-pay

## 2019-10-14 ENCOUNTER — Ambulatory Visit: Payer: Medicare HMO | Admitting: Surgery

## 2019-10-14 ENCOUNTER — Encounter: Payer: Self-pay | Admitting: Surgery

## 2019-10-14 VITALS — BP 164/93 | HR 77 | Temp 97.5°F | Resp 14 | Ht 72.0 in | Wt 261.0 lb

## 2019-10-14 DIAGNOSIS — K8020 Calculus of gallbladder without cholecystitis without obstruction: Secondary | ICD-10-CM | POA: Diagnosis not present

## 2019-10-14 NOTE — Patient Instructions (Signed)
Follow up here in 6 months. Call us if something changes before then.      Cholelithiasis  Cholelithiasis is also called "gallstones." It is a kind of gallbladder disease. The gallbladder is an organ that stores a liquid (bile) that helps you digest fat. Gallstones may not cause symptoms (may be silent gallstones) until they cause a blockage, and then they can cause pain (gallbladder attack). Follow these instructions at home:  Take over-the-counter and prescription medicines only as told by your doctor.  Stay at a healthy weight.  Eat healthy foods. This includes: ? Eating fewer fatty foods, like fried foods. ? Eating fewer refined carbs (refined carbohydrates). Refined carbs are breads and grains that are highly processed, like white bread and white rice. Instead, choose whole grains like whole-wheat bread and brown rice. ? Eating more fiber. Almonds, fresh fruit, and beans are healthy sources of fiber.  Keep all follow-up visits as told by your doctor. This is important. Contact a doctor if:  You have sudden pain in the upper right side of your belly (abdomen). Pain might spread to your right shoulder or your chest. This may be a sign of a gallbladder attack.  You feel sick to your stomach (are nauseous).  You throw up (vomit).  You have been diagnosed with gallstones that have no symptoms and you get: ? Belly pain. ? Discomfort, burning, or fullness in the upper part of your belly (indigestion). Get help right away if:  You have sudden pain in the upper right side of your belly, and it lasts for more than 2 hours.  You have belly pain that lasts for more than 5 hours.  You have a fever or chills.  You keep feeling sick to your stomach or you keep throwing up.  Your skin or the whites of your eyes turn yellow (jaundice).  You have dark-colored pee (urine).  You have light-colored poop (stool). Summary  Cholelithiasis is also called "gallstones."  The gallbladder  is an organ that stores a liquid (bile) that helps you digest fat.  Silent gallstones are gallstones that do not cause symptoms.  A gallbladder attack may cause sudden pain in the upper right side of your belly. Pain might spread to your right shoulder or your chest. If this happens, contact your doctor.  If you have sudden pain in the upper right side of your belly that lasts for more than 2 hours, get help right away. This information is not intended to replace advice given to you by your health care provider. Make sure you discuss any questions you have with your health care provider. Document Revised: 08/18/2017 Document Reviewed: 05/22/2016 Elsevier Patient Education  2020 [employer].

## 2019-10-14 NOTE — Progress Notes (Signed)
Outpatient Surgical Follow Up  10/14/2019  Tyler [name] is an 69 y.o. male.   Chief Complaint  Patient presents with  . Follow-up    gallbladder    HPI: Tyler [name] is a 69 year old well-known to me with a history of abdominal pain that made him go to the ER.  At that time finding was consistent with constipation and also gallstone was found.  I did order an ultrasound of the right upper quadrant and I have personally reviewed the ultrasound and CT scan and there is evidence of gallstone without pericholecystic fluid without any biliary dilation.  More importantly he feels much better after he has been on a bowel regimen.  No nausea no vomiting no abdominal pain.  No fevers no chills no jaundice.  He is able to perform more than 4 METS of activity without any shortness of breath or chest pain.  Past Medical History:  Diagnosis Date  . History of pilonidal cyst   . Hyperlipidemia   . Hypertension   . 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 COLONOSCOPY    . COLONOSCOPY WITH PROPOFOL N/A 10/09/2018   Procedure: COLONOSCOPY WITH PROPOFOL;  Surgeon: Lucilla Lame, MD;  Location: [facility] ENDOSCOPY;  Service: Endoscopy;  Laterality: N/A;  . KNEE ARTHROSCOPY Right   . NASAL SEPTUM SURGERY  2008  . NASAL SINUS SURGERY  2008  . PILONIDAL CYST EXCISION    . VASECTOMY      Family History  Problem Relation Age of Onset  . Heart disease Mother   . Hyperlipidemia Mother   . Hypertension Mother   . CVA Mother 54  . Leukemia Father   . Diabetes Brother   . Colon cancer Brother 67  . Healthy Daughter   . Healthy Daughter   . Colon cancer Paternal Uncle     Social History:  reports that he has never smoked. He has never used smokeless tobacco. He reports current alcohol use of about 3.0 standard drinks of alcohol per week. He reports that he does not use drugs.  Allergies: No Known  Allergies  Medications reviewed.    ROS Full ROS performed and is otherwise negative other than what is stated in HPI   BP (!) 164/93   Pulse 77   Temp (!) 97.5 F (36.4 C)   Resp 14   Ht 6' (1.829 m)   Wt 261 lb (118.4 kg)   SpO2 95%   BMI 35.40 kg/m   Physical Exam Vitals and nursing note reviewed. Exam conducted with a chaperone present.  Constitutional:      General: He is not in acute distress.    Appearance: He is normal weight.  Cardiovascular:     Rate and Rhythm: Normal rate and regular rhythm.  Pulmonary:     Effort: No respiratory distress.     Breath sounds: No stridor. No wheezing or rhonchi.  Abdominal:     General: Abdomen is flat. There is no distension.     Palpations: There is no mass.     Tenderness: There is no abdominal tenderness. There is no guarding or rebound.     Hernia: No hernia is present.  Musculoskeletal:        General: No swelling. Normal range of motion.     Cervical back: Normal range of motion and neck supple. No rigidity.  Skin:    General: Skin is  warm and dry.     Capillary Refill: Capillary refill takes less than 2 seconds.  Neurological:     General: No focal deficit present.     Mental Status: He is alert and oriented to person, place, and time.  Psychiatric:        Mood and Affect: Mood normal.        Behavior: Behavior normal.        Thought Content: Thought content normal.        Judgment: Judgment normal.        Assessment/Plan: 69 year old male with history of constipation and a history of an episode of right upper quadrant pain.  Additionally he does have gallstones and I am not sure whether or not they are the cause of his sxs. I had an extensive discussion about his ultrasound findings.  At this time he wishes to have close follow-up and he understands that if he feels signs and symptoms consistent with right upper quadrant pain nausea and vomiting he will call us back.  Currently no need for emergent surgical  intervention. We will see him back in about 6 months and revisit this issue once again.  Greater than 50% of the 25 minutes  visit was spent in counseling/coordination of care.   Caroleen Hamman, MD [facility] General Surgeon

## 2019-10-16 NOTE — Patient Instructions (Signed)
The CDC recommends two doses of Shingrix (the shingles vaccine) separated by 2 to 6 months for adults age 67 years and older. I recommend checking with your insurance plan regarding coverage for this vaccine.     Preventive Care 9 Years and Older, Male Preventive care refers to lifestyle choices and visits with your health care provider that can promote health and wellness. This includes:  A yearly physical exam. This is also called an annual well check.  Regular dental and eye exams.  Immunizations.  Screening for certain conditions.  Healthy lifestyle choices, such as diet and exercise. What can I expect for my preventive care visit? Physical exam Your health care provider will check:  Height and weight. These may be used to calculate body mass index (BMI), which is a measurement that tells if you are at a healthy weight.  Heart rate and blood pressure.  Your skin for abnormal spots. Counseling Your health care provider may ask you questions about:  Alcohol, tobacco, and drug use.  Emotional well-being.  Home and relationship well-being.  Sexual activity.  Eating habits.  History of falls.  Memory and ability to understand (cognition).  Work and work Statistician. What immunizations do I need?  Influenza (flu) vaccine  This is recommended every year. Tetanus, diphtheria, and pertussis (Tdap) vaccine  You may need a Td booster every 10 years. Varicella (chickenpox) vaccine  You may need this vaccine if you have not already been vaccinated. Zoster (shingles) vaccine  You may need this after age 28. Pneumococcal conjugate (PCV13) vaccine  One dose is recommended after age 76. Pneumococcal polysaccharide (PPSV23) vaccine  One dose is recommended after age 43. Measles, mumps, and rubella (MMR) vaccine  You may need at least one dose of MMR if you were born in 1957 or later. You may also need a second dose. Meningococcal conjugate (MenACWY) vaccine   You may need this if you have certain conditions. Hepatitis A vaccine  You may need this if you have certain conditions or if you travel or work in places where you may be exposed to hepatitis A. Hepatitis B vaccine  You may need this if you have certain conditions or if you travel or work in places where you may be exposed to hepatitis B. Haemophilus influenzae type b (Hib) vaccine  You may need this if you have certain conditions. You may receive vaccines as individual doses or as more than one vaccine together in one shot (combination vaccines). Talk with your health care provider about the risks and benefits of combination vaccines. What tests do I need? Blood tests  Lipid and cholesterol levels. These may be checked every 5 years, or more frequently depending on your overall health.  Hepatitis C test.  Hepatitis B test. Screening  Lung cancer screening. You may have this screening every year starting at age 24 if you have a 30-pack-year history of smoking and currently smoke or have quit within the past 15 years.  Colorectal cancer screening. All adults should have this screening starting at age 46 and continuing until age 61. Your health care provider may recommend screening at age 59 if you are at increased risk. You will have tests every 1-10 years, depending on your results and the type of screening test.  Prostate cancer screening. Recommendations will vary depending on your family history and other risks.  Diabetes screening. This is done by checking your blood sugar (glucose) after you have not eaten for a while (fasting). You may  have this done every 1-3 years.  Abdominal aortic aneurysm (AAA) screening. You may need this if you are a current or former smoker.  Sexually transmitted disease (STD) testing. Follow these instructions at home: Eating and drinking  Eat a diet that includes fresh fruits and vegetables, whole grains, lean protein, and low-fat dairy  products. Limit your intake of foods with high amounts of sugar, saturated fats, and salt.  Take vitamin and mineral supplements as recommended by your health care provider.  Do not drink alcohol if your health care provider tells you not to drink.  If you drink alcohol: ? Limit how much you have to 0-2 drinks a day. ? Be aware of how much alcohol is in your drink. In the [place] one drink equals one 12 oz bottle of beer (355 mL), one 5 oz glass of wine (148 mL), or one 1 oz glass of hard liquor (44 mL). Lifestyle  Take daily care of your teeth and gums.  Stay active. Exercise for at least 30 minutes on 5 or more days each week.  Do not use any products that contain nicotine or tobacco, such as cigarettes, e-cigarettes, and chewing tobacco. If you need help quitting, ask your health care provider.  If you are sexually active, practice safe sex. Use a condom or other form of protection to prevent STIs (sexually transmitted infections).  Talk with your health care provider about taking a low-dose aspirin or statin. What's next?  Visit your health care provider once a year for a well check visit.  Ask your health care provider how often you should have your eyes and teeth checked.  Stay up to date on all vaccines. This information is not intended to replace advice given to you by your health care provider. Make sure you discuss any questions you have with your health care provider. Document Revised: 08/30/2018 Document Reviewed: 08/30/2018 Elsevier Patient Education  2020 [employer]  

## 2019-10-17 ENCOUNTER — Encounter: Payer: Self-pay | Admitting: Family Medicine

## 2019-10-17 ENCOUNTER — Other Ambulatory Visit: Payer: Self-pay

## 2019-10-17 ENCOUNTER — Ambulatory Visit (INDEPENDENT_AMBULATORY_CARE_PROVIDER_SITE_OTHER): Payer: Medicare HMO | Admitting: Family Medicine

## 2019-10-17 VITALS — BP 173/93 | HR 56 | Temp 96.9°F | Resp 16 | Ht 72.0 in | Wt 264.2 lb

## 2019-10-17 DIAGNOSIS — K5900 Constipation, unspecified: Secondary | ICD-10-CM

## 2019-10-17 DIAGNOSIS — I10 Essential (primary) hypertension: Secondary | ICD-10-CM

## 2019-10-17 DIAGNOSIS — Z6834 Body mass index (BMI) 34.0-34.9, adult: Secondary | ICD-10-CM | POA: Diagnosis not present

## 2019-10-17 DIAGNOSIS — Z23 Encounter for immunization: Secondary | ICD-10-CM | POA: Diagnosis not present

## 2019-10-17 DIAGNOSIS — Z0000 Encounter for general adult medical examination without abnormal findings: Secondary | ICD-10-CM | POA: Diagnosis not present

## 2019-10-17 DIAGNOSIS — E669 Obesity, unspecified: Secondary | ICD-10-CM | POA: Diagnosis not present

## 2019-10-17 DIAGNOSIS — E782 Mixed hyperlipidemia: Secondary | ICD-10-CM | POA: Diagnosis not present

## 2019-10-17 NOTE — Assessment & Plan Note (Signed)
After reviewing patient's records from the emergency room, including labs, imaging, provider notes, as well as his follow-up imaging and visits with general surgery, it seems that his abdominal pain was most likely related to significant constipation and not the large gallstone that was found incidentally He has had no further pain since taking magnesium citrate for his constipation and having bowel movements He states his bowel movements are back to normal now We discussed importance of fiber in the diet We discussed signs of biliary colic As he is having no pain at this time, he and Dr. Perrin Maltese decided to hold off on cholecystectomy as the one large gallstone that is noted is unlikely to be the culprit for his pain Discussed return precautions

## 2019-10-17 NOTE — Progress Notes (Signed)
Patient: Tyler [name], Male    DOB: Apr 23, 1951, 69 y.o.   MRN: [id number] Visit Date: 10/17/2019  Today's Provider: Lavon Paganini, MD   Chief Complaint  Patient presents with  . Annual Exam   Subjective:     Complete Physical [name] is a 69 y.o. male. He feels well. He reports exercising no. He reports he is sleeping well. 09/10/2019 AWV with Alyson Ingles 10/09/2018 Colonoscopy-polyp, internal hemorrhoids          pathology-Tubular adenoma -----------------------------------------------------------  Follow up ER visit  Patient was seen in ER for abdominal pain on 09/29/2019. He was treated for constipation. Treatment for this included oral medications. He reports excellent compliance with treatment. He reports this condition is Improved.  ------------------------------------------------------------------------------------    Review of Systems  Constitutional: Negative.   HENT: Negative.   Eyes: Negative.   Respiratory: Negative.   Cardiovascular: Negative.   Gastrointestinal: Positive for constipation.  Endocrine: Negative.   Genitourinary: Negative.   Musculoskeletal: Negative.   Skin: Negative.   Allergic/Immunologic: Negative.   Neurological: Negative.   Hematological: Negative.   Psychiatric/Behavioral: Negative.     Social History   Socioeconomic History  . Marital status: Married    Spouse name: Not on file  . Number of children: 2  . Years of education: Not on file  . Highest education level: Some college, no degree  Occupational History  . Occupation: retired Medical illustrator  Tobacco Use  . Smoking status: Never Smoker  . Smokeless tobacco: Never Used  Substance and Sexual Activity  . Alcohol use: Yes    Alcohol/week: 3.0 standard drinks    Types: 3 Cans of beer per week  . Drug use: Never  . Sexual activity: Yes    Partners: Female    Birth control/protection: Surgical  Other Topics Concern  . Not on file  Social  History Narrative  . Not on file   Social Determinants of Health   Financial Resource Strain: Low Risk   . Difficulty of Paying Living Expenses: Not hard at all  Food Insecurity: No Food Insecurity  . Worried About Charity fundraiser in the Last Year: Never true  . Ran Out of Food in the Last Year: Never true  Transportation Needs: No Transportation Needs  . Lack of Transportation (Medical): No  . Lack of Transportation (Non-Medical): No  Physical Activity: Inactive  . Days of Exercise per Week: 0 days  . Minutes of Exercise per Session: 0 min  Stress: No Stress Concern Present  . Feeling of Stress : Not at all  Social Connections: Not Isolated  . Frequency of Communication with Friends and Family: More than three times a week  . Frequency of Social Gatherings with Friends and Family: Twice a week  . Attends Religious Services: More than 4 times per year  . Active Member of Clubs or Organizations: Yes  . Attends Archivist Meetings: More than 4 times per year  . Marital Status: Married  Human resources officer Violence: Not At Risk  . Fear of Current or Ex-Partner: No  . Emotionally Abused: No  . Physically Abused: No  . Sexually Abused: No    Past Medical History:  Diagnosis Date  . History of pilonidal cyst   . Hyperlipidemia   . Hypertension   . Laryngeal cancer [facility] 2008   Treated with Chemo and radiation at [facility]     Patient Active Problem List   Diagnosis Date Noted  .  Special screening for malignant neoplasms, colon 10/09/2018  . Benign neoplasm of transverse colon   . Essential hypertension 07/19/2018  . Hyperlipidemia 07/19/2018  . History of laryngeal cancer 07/19/2018  . Obesity 07/19/2018    Past Surgical History:  Procedure Laterality Date  . COLONOSCOPY    . COLONOSCOPY WITH PROPOFOL N/A 10/09/2018   Procedure: COLONOSCOPY WITH PROPOFOL;  Surgeon: Lucilla Lame, MD;  Location: [facility] ENDOSCOPY;  Service: Endoscopy;  Laterality: N/A;  . KNEE  ARTHROSCOPY Right   . NASAL SEPTUM SURGERY  2008  . NASAL SINUS SURGERY  2008  . PILONIDAL CYST EXCISION    . VASECTOMY      His family history includes CVA (age of onset: 84) in his mother; Colon cancer in his paternal uncle; Colon cancer (age of onset: 62) in his brother; Diabetes in his brother; Healthy in his daughter and daughter; Heart disease in his mother; Hyperlipidemia in his mother; Hypertension in his mother; Leukemia in his father.   Current Outpatient Medications:  .  amLODipine (NORVASC) 10 MG tablet, Take 1 tablet (10 mg total) by mouth daily., Disp: 90 tablet, Rfl: 3 .  hydrochlorothiazide (HYDRODIURIL) 25 MG tablet, Take 1 tablet (25 mg total) by mouth daily., Disp: 30 tablet, Rfl: 3 .  rosuvastatin (CRESTOR) 20 MG tablet, Take 1 tablet (20 mg total) by mouth daily., Disp: 90 tablet, Rfl: 3 .  sildenafil (VIAGRA) 100 MG tablet, Take 0.5-1 tablets (50-100 mg total) by mouth as needed for erectile dysfunction., Disp: 10 tablet, Rfl: 5  Patient Care Team: Virginia Crews, MD as PCP - General (Family Medicine) Ralene Bathe, MD (Dermatology)     Objective:    Vitals: There were no vitals taken for this visit.  Physical Exam Vitals reviewed.  Constitutional:      General: He is not in acute distress.    Appearance: Normal appearance. He is well-developed. He is not diaphoretic.  HENT:     Head: Normocephalic and atraumatic.     Right Ear: Tympanic membrane, ear canal and external ear normal.     Left Ear: Tympanic membrane, ear canal and external ear normal.  Eyes:     General: No scleral icterus.    Conjunctiva/sclera: Conjunctivae normal.     Pupils: Pupils are equal, round, and reactive to light.  Neck:     Thyroid: No thyromegaly.  Cardiovascular:     Rate and Rhythm: Normal rate and regular rhythm.     Pulses: Normal pulses.     Heart sounds: Normal heart sounds. No murmur.  Pulmonary:     Effort: Pulmonary effort is normal. No respiratory  distress.     Breath sounds: Normal breath sounds. No wheezing or rales.  Abdominal:     General: There is no distension.     Palpations: Abdomen is soft.     Tenderness: There is no abdominal tenderness.  Musculoskeletal:        General: No deformity.     Cervical back: Neck supple.     Right lower leg: No edema.     Left lower leg: No edema.  Lymphadenopathy:     Cervical: No cervical adenopathy.  Skin:    General: Skin is warm and dry.     Capillary Refill: Capillary refill takes less than 2 seconds.     Findings: No rash.  Neurological:     Mental Status: He is alert and oriented to person, place, and time. Mental status is at baseline.  Psychiatric:  Mood and Affect: Mood normal.        Speech: Speech normal.        Behavior: Behavior is agitated. Behavior is not aggressive. Behavior is cooperative.        Thought Content: Thought content normal.     Activities of Daily Living In your present state of health, do you have any difficulty performing the following activities: 09/10/2019  Hearing? N  Vision? N  Difficulty concentrating or making decisions? N  Walking or climbing stairs? N  Dressing or bathing? N  Doing errands, shopping? N  Preparing Food and eating ? N  Using the Toilet? N  In the past six months, have you accidently leaked urine? N  Do you have problems with loss of bowel control? N  Managing your Medications? N  Managing your Finances? N  Housekeeping or managing your Housekeeping? N  Some recent data might be hidden    Fall Risk Assessment Fall Risk  10/14/2019 10/02/2019 09/10/2019 09/03/2018 07/19/2018  Falls in the past year? 0 0 0 0 No  Number falls in past yr: 0 0 0 - -  Injury with Fall? 0 0 0 - -     Depression Screen PHQ 2/9 Scores 09/10/2019 09/10/2019 09/03/2018 07/19/2018  PHQ - 2 Score 0 0 0 0  PHQ- 9 Score - - 0 0    6CIT Screen 09/03/2018  What Year? 0 points  What month? 0 points  What time? 0 points  Count back  from 20 0 points  Months in reverse 0 points  Repeat phrase 0 points  Total Score 0       Assessment & Plan:    Annual Physical Reviewed patient's Family Medical History Reviewed and updated list of patient's medical providers Assessment of cognitive impairment was done Assessed patient's functional ability Established a written schedule for health screening [place] Completed and Reviewed  Exercise Activities and Dietary recommendations Goals    . DIET - REDUCE SUGAR INTAKE     Recommend to cut back on sugar foods and sweets to a couple times a week versus daily.        Immunization History  Administered Date(s) Administered  . Influenza, High Dose Seasonal PF 07/19/2018, 05/28/2019  . Pneumococcal Conjugate-13 07/19/2018  . Td 03/04/2008  . Tdap 05/11/2015    Health Maintenance  Topic Date Due  . PNA vac Low Risk Adult (2 of 2 - PPSV23) 07/20/2019  . COLONOSCOPY  10/10/2023  . TETANUS/TDAP  05/10/2025  . INFLUENZA VACCINE  Completed  . Hepatitis C Screening  Completed     Discussed health benefits of physical activity, and encouraged him to engage in regular exercise appropriate for his age and condition.    ------------------------------------------------------------------------------------------------------------  Problem List Items Addressed This Visit      Cardiovascular and Mediastinum   Essential hypertension    Quite elevated and uncontrolled today He expresses that his stress levels are extremely high due to some home situation and believes that this is related to his spike in blood pressure today He is asymptomatic Continue current medications at current doses Reviewed recent metabolic panel Encourage diet, exercise, weight loss Encouraged monitoring home blood pressures Discussed red flag signs Follow-up in 1 month and if still elevated, would consider further dose titration of medications        Other   Hyperlipidemia     Reviewed last lipid panel Well-controlled Continue Crestor      Obesity    Discussed  importance of healthy weight management Discussed diet and exercise      Constipation    After reviewing patient's records from the emergency room, including labs, imaging, provider notes, as well as his follow-up imaging and visits with general surgery, it seems that his abdominal pain was most likely related to significant constipation and not the large gallstone that was found incidentally He has had no further pain since taking magnesium citrate for his constipation and having bowel movements He states his bowel movements are back to normal now We discussed importance of fiber in the diet We discussed signs of biliary colic As he is having no pain at this time, he and Dr. Perrin Maltese decided to hold off on cholecystectomy as the one large gallstone that is noted is unlikely to be the culprit for his pain Discussed return precautions       Other Visit Diagnoses    Encounter for annual physical exam    -  Primary   Need for pneumococcal vaccination       Relevant Orders   Pneumococcal polysaccharide vaccine 23-valent greater than or equal to 2yo subcutaneous/IM (Completed)       Return in about 4 weeks (around 11/14/2019) for BP f/u.   The entirety of the information documented in the History of Present Illness, Review of Systems and Physical Exam were personally obtained by me. Portions of this information were initially documented by Lynford Humphrey, CMA and reviewed by me for thoroughness and accuracy.    Laurence Crofford, Dionne Bucy, MD MPH [facility]

## 2019-10-17 NOTE — Assessment & Plan Note (Signed)
Quite elevated and uncontrolled today He expresses that his stress levels are extremely high due to some home situation and believes that this is related to his spike in blood pressure today He is asymptomatic Continue current medications at current doses Reviewed recent metabolic panel Encourage diet, exercise, weight loss Encouraged monitoring home blood pressures Discussed red flag signs Follow-up in 1 month and if still elevated, would consider further dose titration of medications

## 2019-10-17 NOTE — Assessment & Plan Note (Signed)
Discussed importance of healthy weight management Discussed diet and exercise  

## 2019-10-17 NOTE — Assessment & Plan Note (Signed)
Reviewed last lipid panel Well-controlled Continue Crestor

## 2019-10-21 DIAGNOSIS — L304 Erythema intertrigo: Secondary | ICD-10-CM | POA: Diagnosis not present

## 2019-10-21 DIAGNOSIS — L720 Epidermal cyst: Secondary | ICD-10-CM | POA: Diagnosis not present

## 2019-10-21 DIAGNOSIS — L905 Scar conditions and fibrosis of skin: Secondary | ICD-10-CM | POA: Diagnosis not present

## 2019-10-21 DIAGNOSIS — D2230 Melanocytic nevi of unspecified part of face: Secondary | ICD-10-CM | POA: Diagnosis not present

## 2019-10-21 DIAGNOSIS — D225 Melanocytic nevi of trunk: Secondary | ICD-10-CM | POA: Diagnosis not present

## 2019-10-21 DIAGNOSIS — L821 Other seborrheic keratosis: Secondary | ICD-10-CM | POA: Diagnosis not present

## 2019-10-21 DIAGNOSIS — Z1283 Encounter for screening for malignant neoplasm of skin: Secondary | ICD-10-CM | POA: Diagnosis not present

## 2019-10-21 DIAGNOSIS — L820 Inflamed seborrheic keratosis: Secondary | ICD-10-CM | POA: Diagnosis not present

## 2019-10-21 DIAGNOSIS — L918 Other hypertrophic disorders of the skin: Secondary | ICD-10-CM | POA: Diagnosis not present

## 2019-10-21 DIAGNOSIS — D485 Neoplasm of uncertain behavior of skin: Secondary | ICD-10-CM | POA: Diagnosis not present

## 2019-10-21 DIAGNOSIS — L738 Other specified follicular disorders: Secondary | ICD-10-CM | POA: Diagnosis not present

## 2019-11-18 ENCOUNTER — Ambulatory Visit: Payer: Self-pay | Admitting: Family Medicine

## 2019-11-24 ENCOUNTER — Other Ambulatory Visit: Payer: Self-pay | Admitting: Family Medicine

## 2019-11-24 NOTE — Telephone Encounter (Signed)
Requested Prescriptions  Pending Prescriptions Disp Refills  . hydrochlorothiazide (HYDRODIURIL) 25 MG tablet [Pharmacy Med Name: HYDROCHLOROTHIAZIDE 25 MG TAB] 90 tablet 1    Sig: TAKE 1 TABLET BY MOUTH EVERY DAY     Cardiovascular: Diuretics - Thiazide Failed - 11/24/2019  9:38 AM      Failed - Cr in normal range and within 360 days    Creatinine, Ser  Date Value Ref Range Status  09/29/2019 1.59 (H) 0.61 - 1.24 mg/dL Final         Failed - Last BP in normal range    BP Readings from Last 1 Encounters:  10/17/19 (!) 173/93         Passed - Ca in normal range and within 360 days    Calcium  Date Value Ref Range Status  09/29/2019 9.5 8.9 - 10.3 mg/dL Final         Passed - K in normal range and within 360 days    Potassium  Date Value Ref Range Status  09/29/2019 3.8 3.5 - 5.1 mmol/L Final         Passed - Na in normal range and within 360 days    Sodium  Date Value Ref Range Status  09/29/2019 139 135 - 145 mmol/L Final  05/06/2019 138 134 - 144 mmol/L Final         Passed - Valid encounter within last 6 months    Recent Outpatient Visits          1 month ago Encounter for annual physical exam   [facility], Dionne Bucy, MD   5 months ago Essential hypertension   [place], Dionne Bucy, MD   6 months ago Essential hypertension   [place], Dionne Bucy, MD   1 year ago Essential hypertension   [facility] [place], Dionne Bucy, MD   1 year ago [facility] preventive visit   [facility], Dionne Bucy, MD

## 2019-12-03 ENCOUNTER — Other Ambulatory Visit: Payer: Self-pay

## 2019-12-03 ENCOUNTER — Ambulatory Visit: Payer: Medicare HMO | Admitting: Dermatology

## 2019-12-03 DIAGNOSIS — L720 Epidermal cyst: Secondary | ICD-10-CM | POA: Diagnosis not present

## 2019-12-03 MED ORDER — MUPIROCIN 2 % EX OINT: 1.0000 "application " | TOPICAL_OINTMENT | Freq: Every day | CUTANEOUS | 0 refills | Status: DC

## 2019-12-03 NOTE — Patient Instructions (Signed)

## 2019-12-03 NOTE — Progress Notes (Signed)
   Follow-Up Visit   Subjective  Tyler [name] is a 69 y.o. male who presents for the following: Cyst (right post neck). Bump Under Skin His subcutaneous nodule is located over the DERM LESION LOCATION: neck. This has been present for 2 year(s). There has been swelling. He does have a history of epidermal inclusion cysts. He does not have a history of lipomas.   The following portions of the chart were reviewed this encounter and updated as appropriate:     Review of Systems: No other skin or systemic complaints.  Objective  Well appearing patient in no apparent distress; mood and affect are within normal limits.  A focused examination was performed including head and neck. Relevant physical exam findings are noted in the Assessment and Plan.  Objective  right post neck: Cystic papule 1.2 cm  Assessment & Plan  Epidermal inclusion cyst right post neck  Skin excision - right post neck  Lesion length (cm):  1.2 Lesion width (cm):  1.2 Margin per side (cm):  0.1 Total excision diameter (cm):  1.4 Informed consent: discussed and consent obtained   Timeout: patient name, date of birth, surgical site, and procedure verified   Procedure prep:  Patient was prepped and draped in usual sterile fashion Prep type:  Isopropyl alcohol and povidone-iodine Anesthesia: the lesion was anesthetized in a standard fashion   Anesthetic:  1% lidocaine w/ epinephrine 1-100,000 nerve block Instrument used: #15 blade   Hemostasis achieved with: pressure   Hemostasis achieved with comment:  Electrocautery Outcome: patient tolerated procedure well with no complications   Post-procedure details: sterile dressing applied and wound care instructions given   Dressing type: bandage and pressure dressing (mupirocin)    Skin repair - right post neck Complexity:  Complex Final length (cm):  2 Procedure prep:  Patient was prepped and draped in usual sterile fashion Reason for type of repair: reduce  tension to allow closure, reduce the risk of dehiscence, infection, and necrosis, reduce subcutaneous dead space and avoid a hematoma, allow closure of the large defect, preserve normal anatomy, preserve normal anatomical and functional relationships and enhance both functionality and cosmetic results   Undermining: area extensively undermined   Undermining comment:  Undermining defect 1.2 cm Subcutaneous layers (deep stitches):  Suture size:  4-0 Suture type: Vicryl (polyglactin 910)   Fine/surface layer approximation (top stitches):  Suture size:  4-0 Suture type comment:  Inverted dermal Stitches: simple running   Suture removal (days):  7 Hemostasis achieved with: suture and pressure Outcome: patient tolerated procedure well with no complications   Post-procedure details: sterile dressing applied and wound care instructions given   Dressing type: bandage and pressure dressing (mupirocin)    mupirocin ointment (BACTROBAN) 2 % - right post neck  Specimen 1 - Surgical pathology Differential Diagnosis: Cyst vs other  Check Margins: No Cystic papule 1.2 cm   Return in about 1 week (around 12/10/2019) for suture removal.

## 2019-12-05 ENCOUNTER — Telehealth: Payer: Self-pay

## 2019-12-05 NOTE — Telephone Encounter (Signed)
Pt doing fine after surgery on 12/03/19./sh

## 2019-12-10 ENCOUNTER — Ambulatory Visit (INDEPENDENT_AMBULATORY_CARE_PROVIDER_SITE_OTHER): Payer: Medicare HMO | Admitting: Dermatology

## 2019-12-10 ENCOUNTER — Encounter: Payer: Self-pay | Admitting: Dermatology

## 2019-12-10 ENCOUNTER — Other Ambulatory Visit: Payer: Self-pay

## 2019-12-10 DIAGNOSIS — L720 Epidermal cyst: Secondary | ICD-10-CM

## 2019-12-10 DIAGNOSIS — Z4802 Encounter for removal of sutures: Secondary | ICD-10-CM

## 2019-12-10 NOTE — Progress Notes (Signed)
   Follow-Up Visit   Subjective  Tyler [name] is a 69 y.o. male who presents for the following: post op./suture removal (pathology proven benign cyst of the R post neck - healing well per pt).   The following portions of the chart were reviewed this encounter and updated as appropriate:     Review of Systems: No other skin or systemic complaints.  Objective  Well appearing patient in no apparent distress; mood and affect are within normal limits.  A focused examination was performed including R post neck. Relevant physical exam findings are noted in the Assessment and Plan.  Objective  R post neck: Healing excision site  Assessment & Plan  Epidermoid cyst R post neck  Suture removed, steri strips applied, and pathology results discussed.   Return if symptoms worsen or fail to improve.   Tanja Port, MD, am acting as scribe for Sarina Ser, MD .

## 2020-01-13 IMAGING — CT CT ABD-PELV W/ CM
2 of 5 series · 15 of 46 positions shown, 17 images · IV contrast (APPLIED)
Comparison: None.

CLINICAL DATA: Abdominal bloating with dysuria. Right-sided pain.

EXAM:
CT ABDOMEN AND PELVIS WITH CONTRAST
TECHNIQUE: Multidetector CT imaging of the abdomen and pelvis was performed
using the standard protocol following bolus administration of
intravenous contrast.
CONTRAST:  100mL OMNIPAQUE IOHEXOL 300 MG/ML  SOLN

[Series 2: routine abd/pel with · axial · 0.88mm/px · z∈[-1123,-648]mm · 12 of 107 slices shown, 14 images]
[im 6/107  soft-tissue]
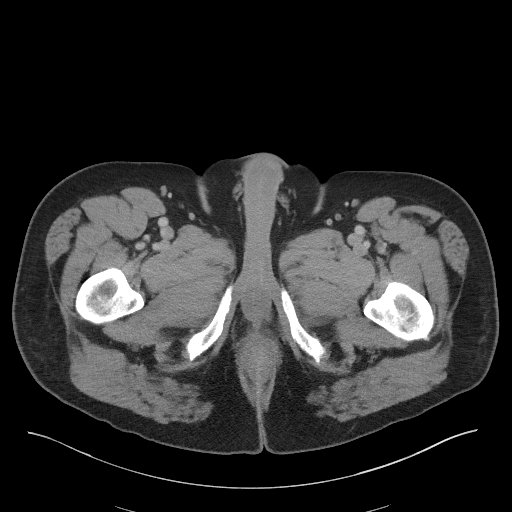
[im 6/107  bone]
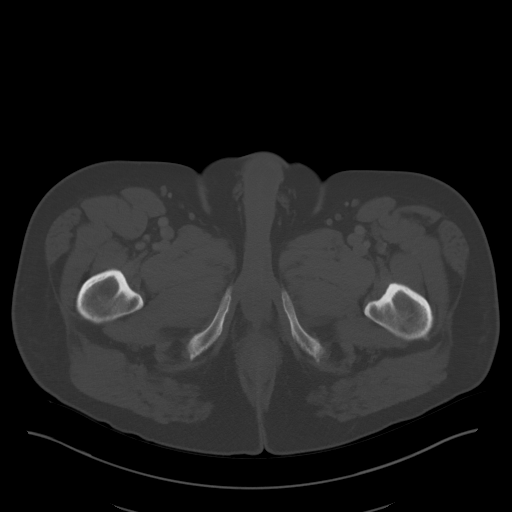
[im 17/107  soft-tissue]
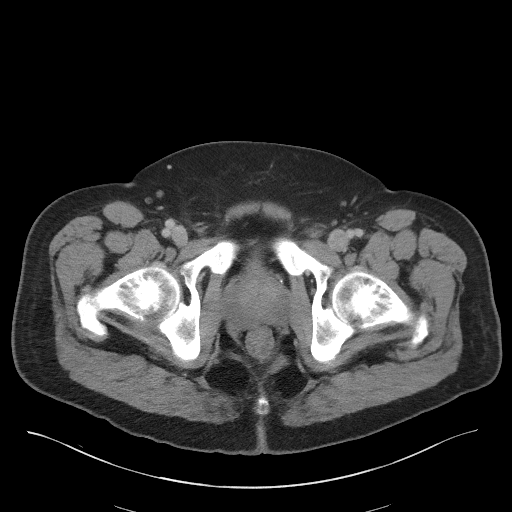
[im 23/107  soft-tissue]
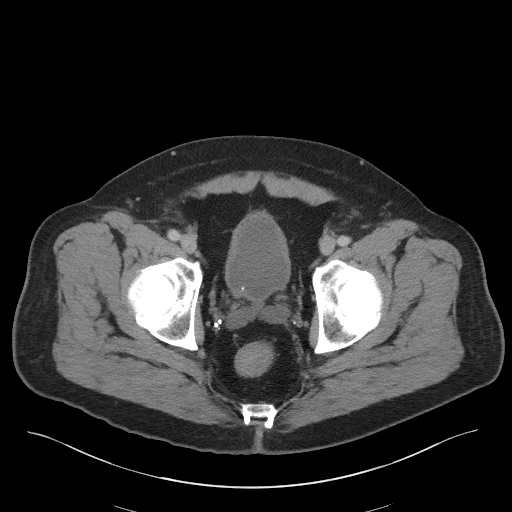
[im 34/107  soft-tissue]
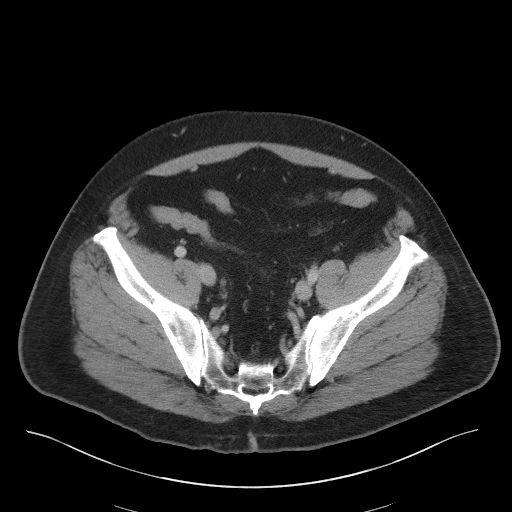
[im 40/107  soft-tissue]
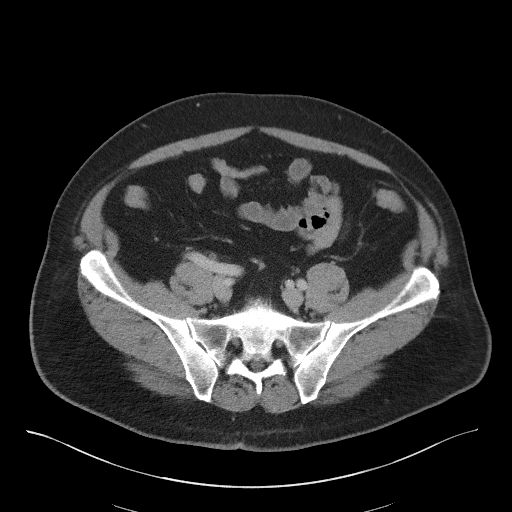
[im 51/107  soft-tissue]
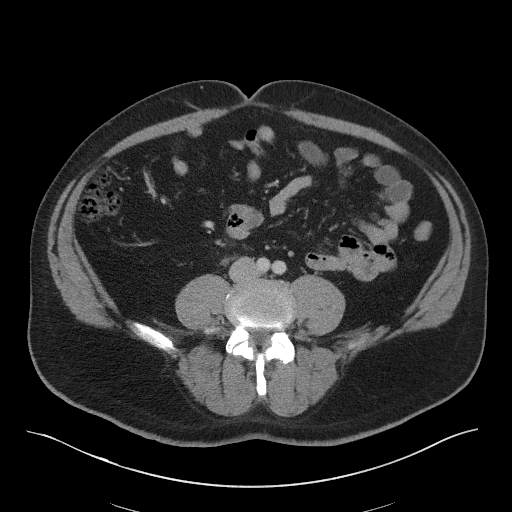
[im 56/107  soft-tissue]
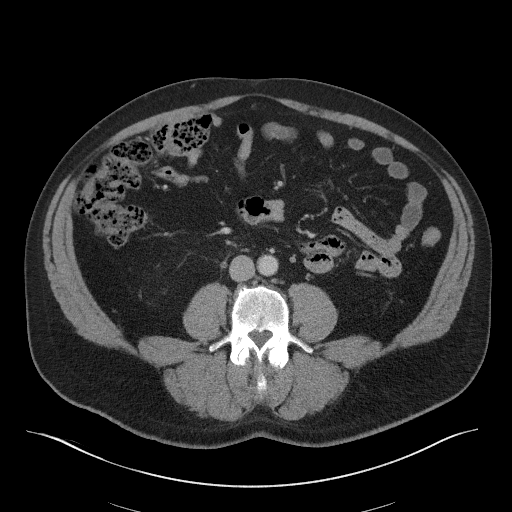
[im 67/107  soft-tissue]
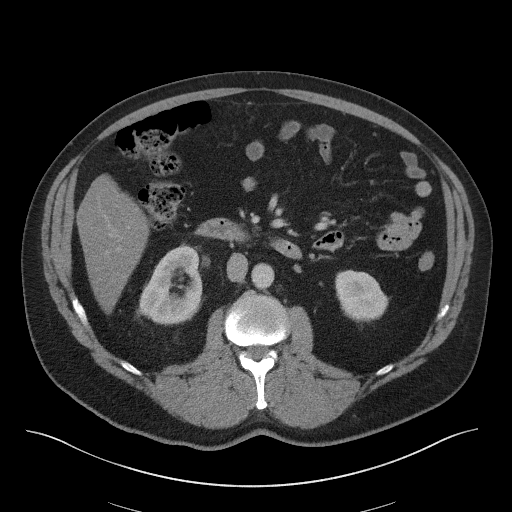
[im 73/107  soft-tissue]
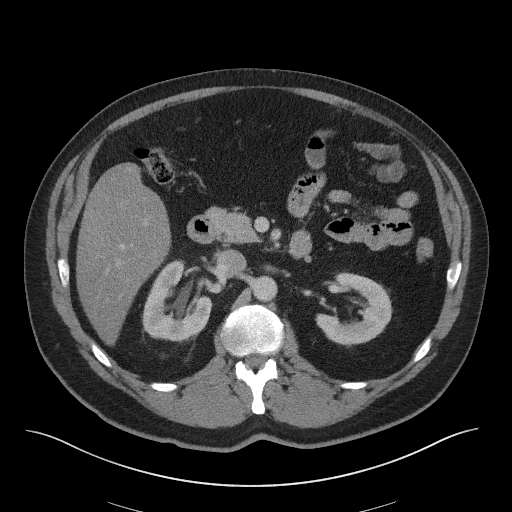
[im 73/107  bone]
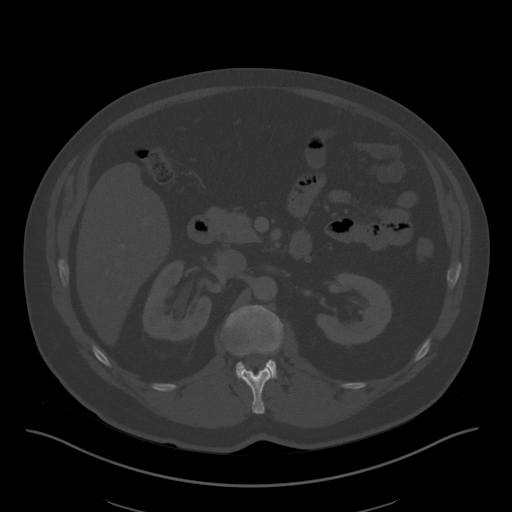
[im 84/107  soft-tissue]
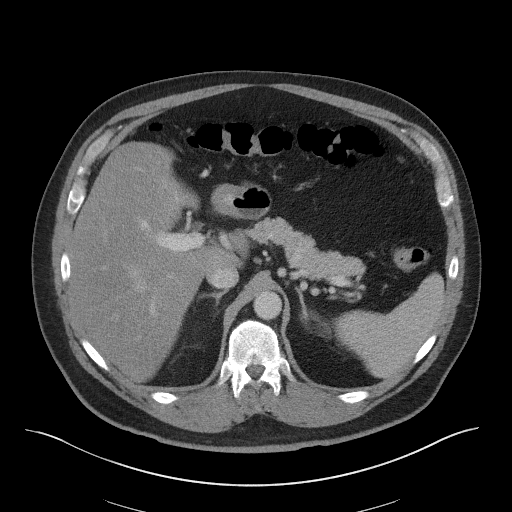
[im 90/107  soft-tissue]
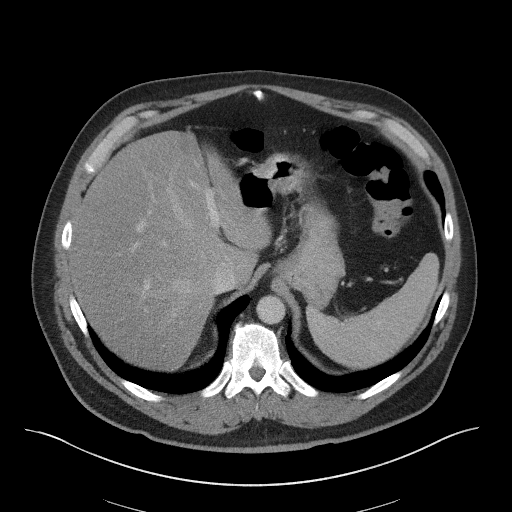
[im 101/107  soft-tissue]
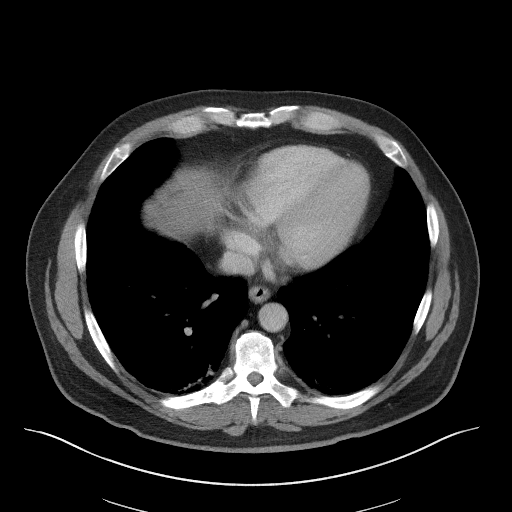

[Series 5: coronal st · coronal · 0.81mm/px · 3 of 107 slices shown]
[im 36/107  soft-tissue]
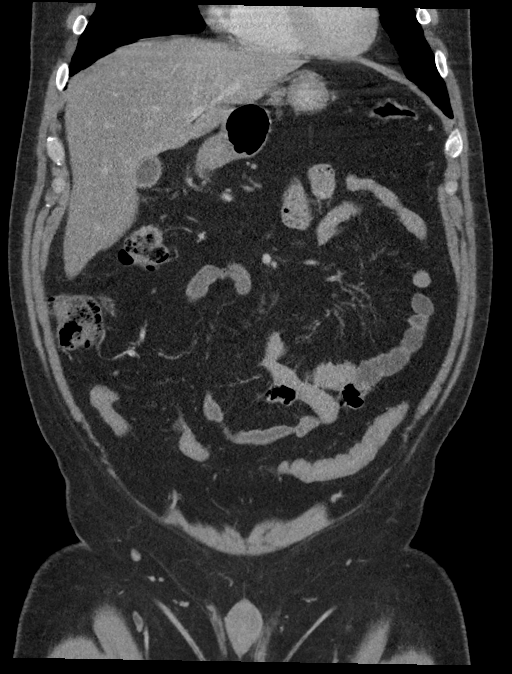
[im 48/107  soft-tissue]
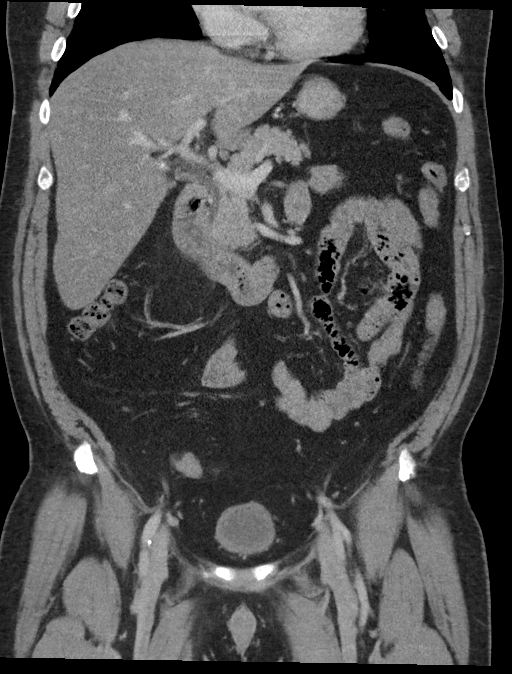
[im 59/107  soft-tissue]
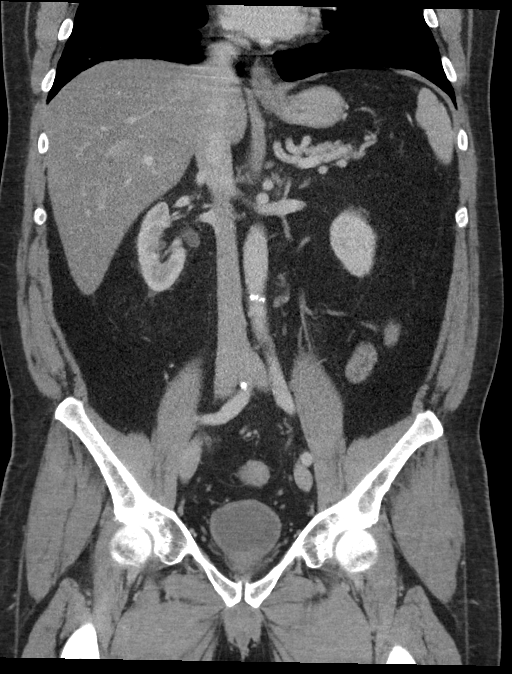

[15 of 46 positions shown; findings below may reference images not displayed]

FINDINGS: Lower chest: 8 mm pulmonary nodule noted right lower lobe on image
2/series 4.

Hepatobiliary: The liver shows diffusely decreased attenuation
suggesting fat deposition. 2.3 cm densely calcified gallstone
evident gallbladder is nondistended. No intrahepatic or extrahepatic
biliary dilation.

Pancreas: No focal mass lesion. No dilatation of the main duct. No
intraparenchymal cyst. No peripancreatic edema.

Spleen: No splenomegaly. No focal mass lesion.

Adrenals/Urinary Tract: No adrenal nodule or mass.

Mild right hydroureteronephrosis noted with decreased perfusion to
the right kidney. Periureteric edema associated. No stones in the
right kidney or ureter but there is a 2 mm stone in the right UVJ
(axial image 85/series 2).

Left kidney and ureter unremarkable. No stones free in the bladder
lumen.

Stomach/Bowel: Stomach is unremarkable. No gastric wall thickening.
No evidence of outlet obstruction. Duodenum is normally positioned
as is the ligament of Treitz. No small bowel wall thickening. No
small bowel dilatation. The terminal ileum is normal. The appendix
is best seen on coronal images and is unremarkable. No gross colonic
mass. No colonic wall thickening.

Vascular/Lymphatic: There is abdominal aortic atherosclerosis
without aneurysm. There is no gastrohepatic or hepatoduodenal
ligament lymphadenopathy. No retroperitoneal or mesenteric
lymphadenopathy. No pelvic sidewall lymphadenopathy.

Reproductive: The prostate gland and seminal vesicles are
unremarkable.

Other: No intraperitoneal free fluid.

Musculoskeletal: No worrisome lytic or sclerotic osseous
abnormality. Tiny sclerotic foci in the right sacrum and right iliac
crest are likely bone islands.
IMPRESSION: 1. 2 mm right UVJ stone with mild secondary changes in the right
kidney and ureter.
2. 8 mm right lower lobe pulmonary nodule. Non-contrast chest CT at
6-12 months is recommended. If the nodule is stable at time of
repeat CT, then future CT at 18-24 months (from today's scan) is
considered optional for low-risk patients, but is recommended for
high-risk patients. This recommendation follows the consensus
statement: Guidelines for Management of Incidental Pulmonary Nodules
Detected on CT Images: From the [HOSPITAL] 8330; Radiology
8330; [DATE].
3. Cholelithiasis.
4. Hepatic steatosis.
5. Tiny sclerotic foci in the right sacrum and posterior right iliac
crest. Patient has a remote history of laryngeal cancer and while
these are likely benign, follow-up may be warranted to ensure
stability. Short-term interval nuclear medicine bone scan could also
be used to determine whether there is active bony turnover in these
lesions.
6. Aortic Atherosclerosis ([id number]-[id number]).

## 2020-01-22 IMAGING — US US ABDOMEN LIMITED
1 series · 14 of 25 positions shown · non-contrast
Comparison: CT scan 09/29/2019

CLINICAL DATA: Followup gallstones seen on recent CT scan.
Abdominal pain and bloating.

EXAM:
ULTRASOUND ABDOMEN LIMITED RIGHT UPPER QUADRANT

[Series 1: us abdomen limited · 0.26mm/px · 14 of 57 slices shown]
[im 1/57]
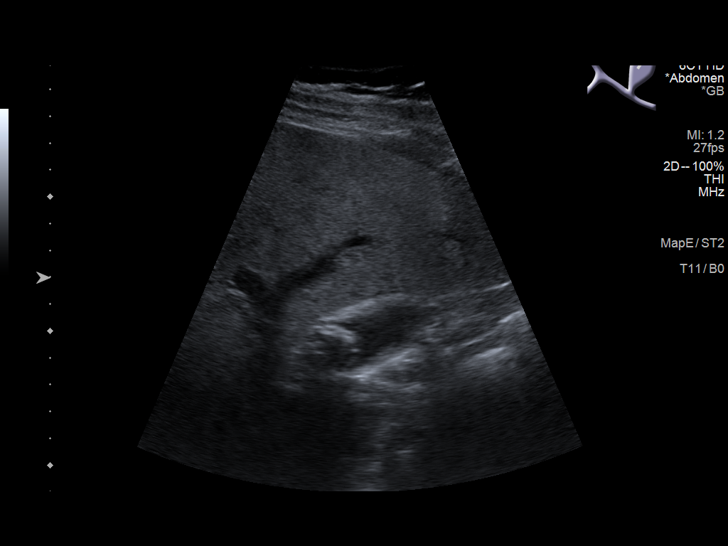
[im 5/57]
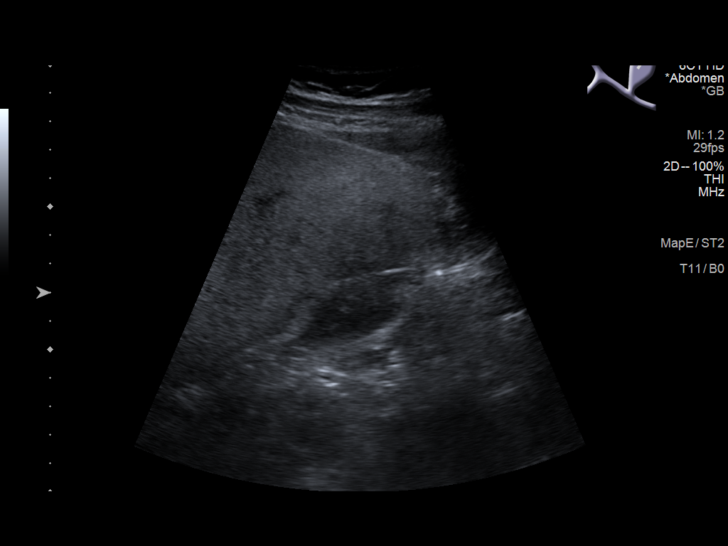
[im 10/57]
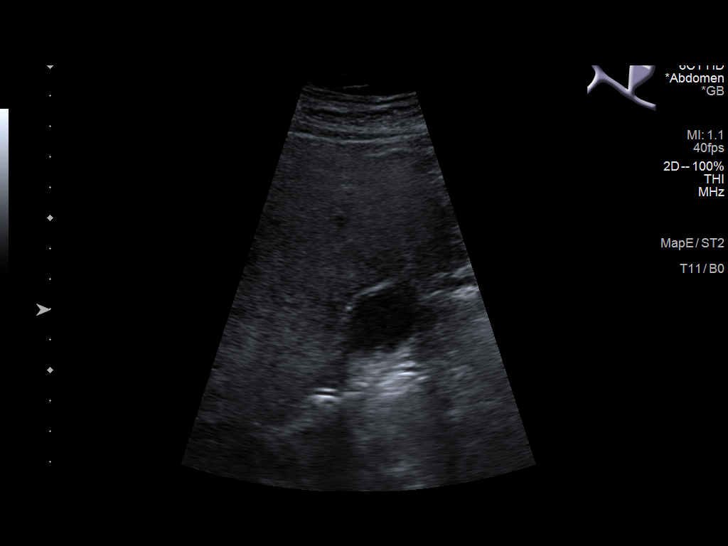
[im 15/57]
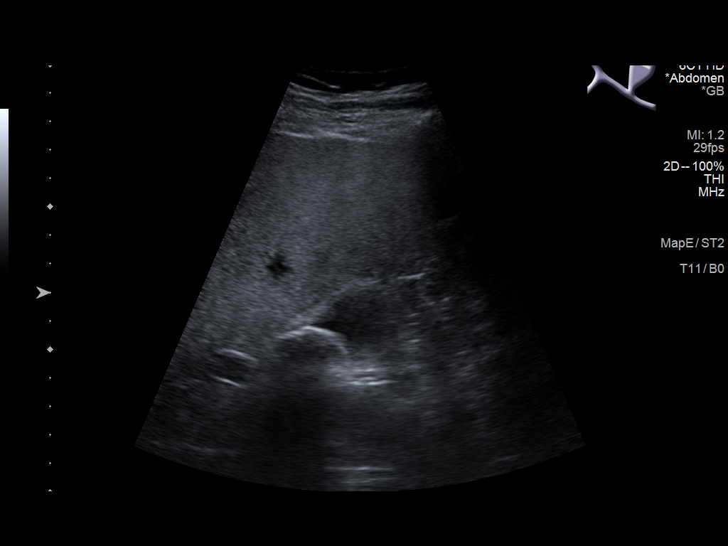
[im 19/57]
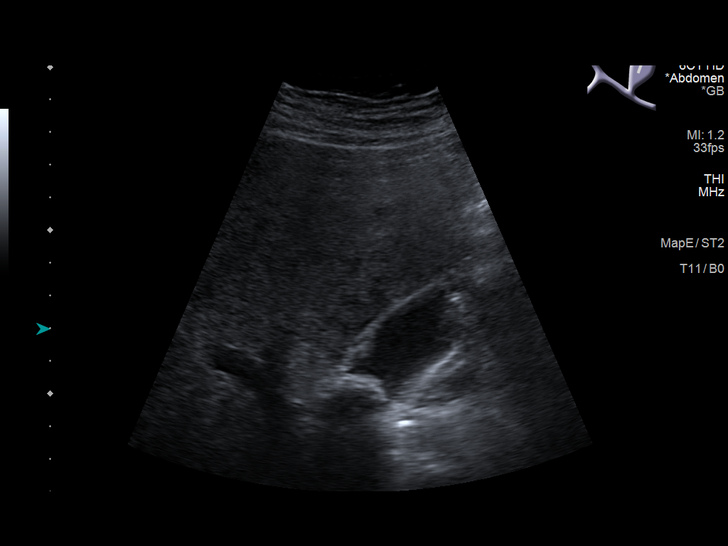
[im 22/57]
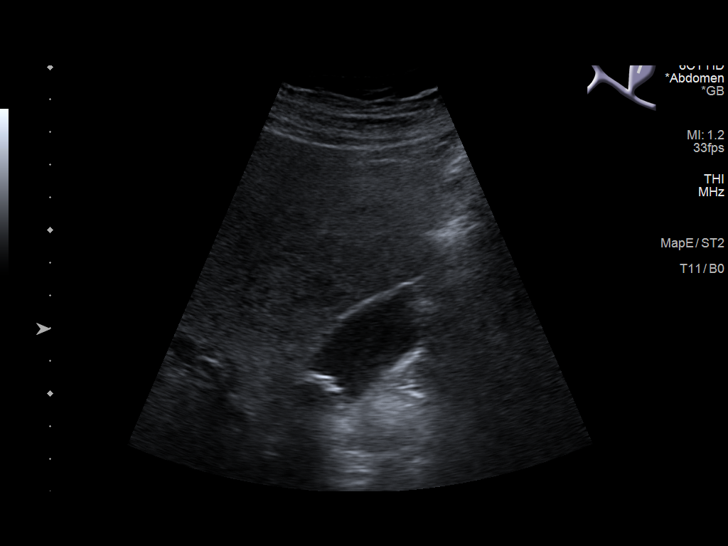
[im 26/57]
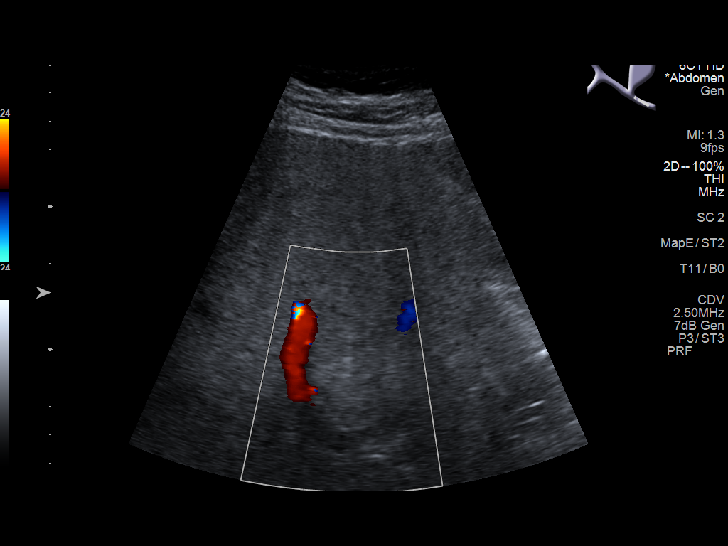
[im 31/57]
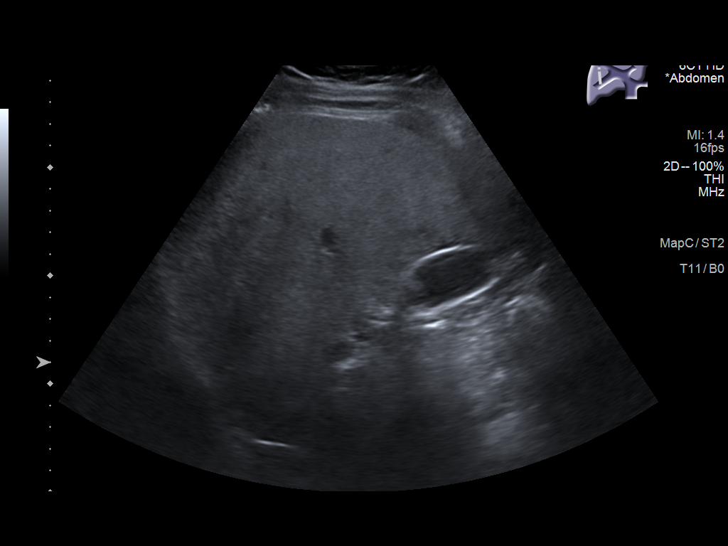
[im 36/57]
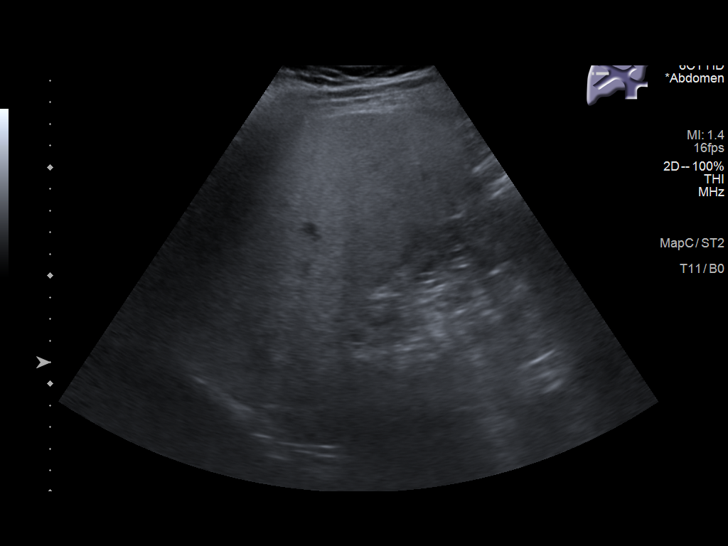
[im 38/57]
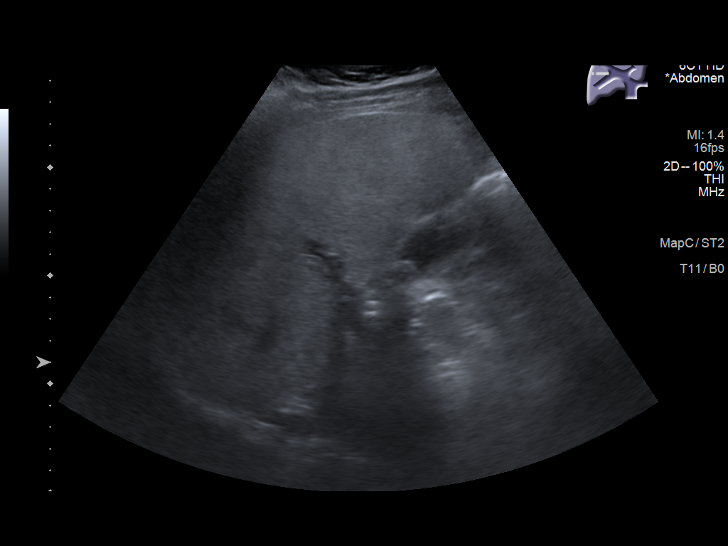
[im 43/57]
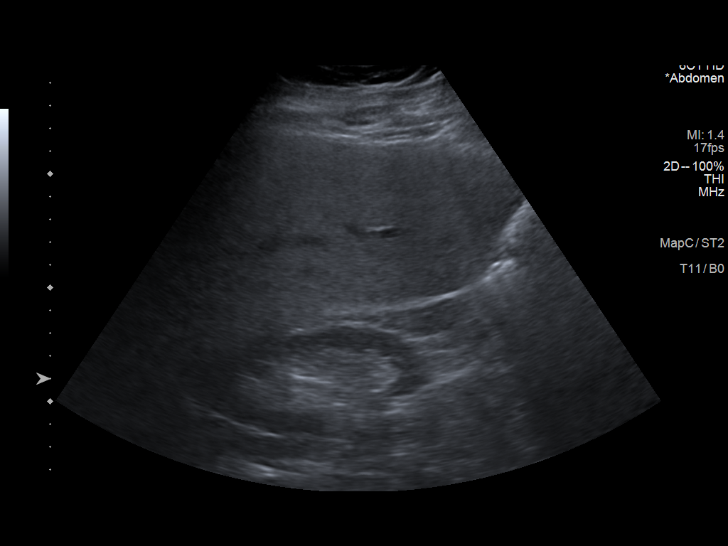
[im 47/57]
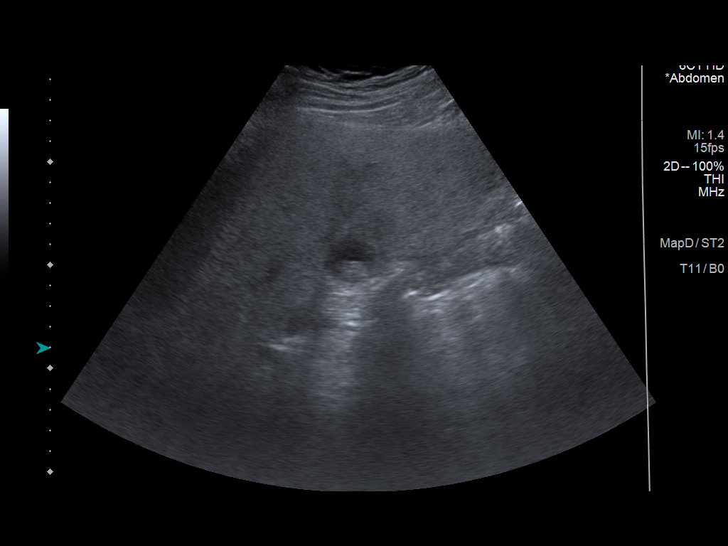
[im 52/57]
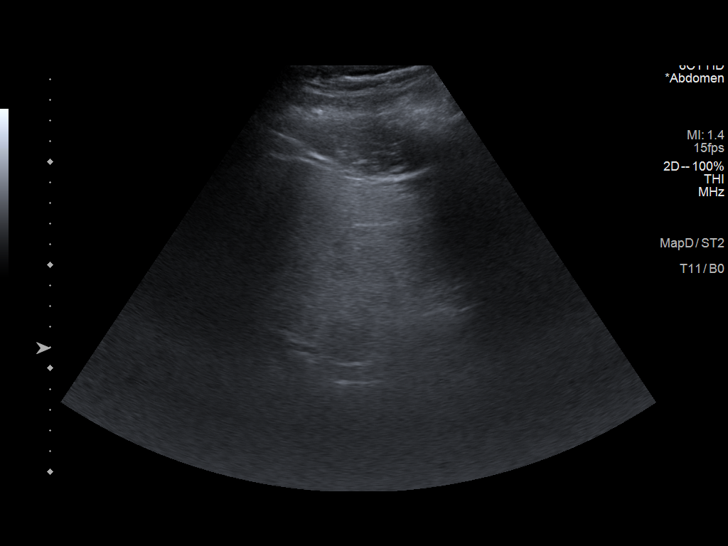
[im 57/57]
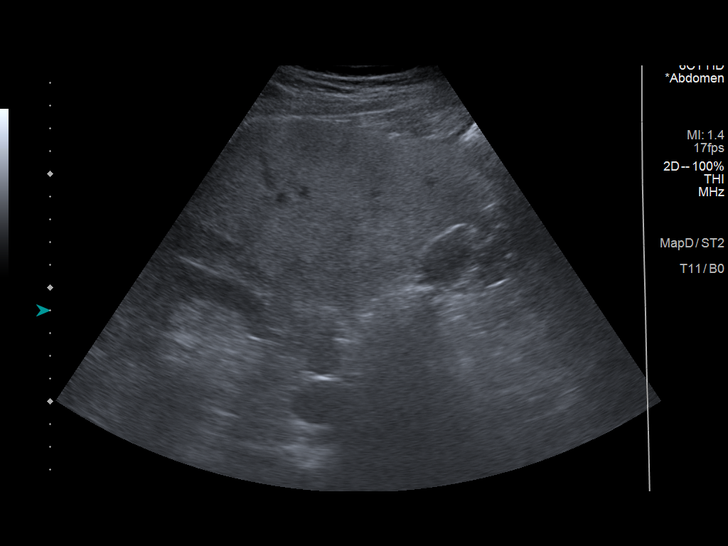

[14 of 25 positions shown; findings below may reference images not displayed]

FINDINGS: Gallbladder:

A 2.6 cm shadowing gallstone is noted. No gallbladder wall
thickening, pericholecystic fluid or sonographic Murphy sign to
suggest acute cholecystitis.

Common bile duct:

Diameter: 4.1 mm

Liver:

There is diffuse increased echogenicity of the liver and decreased
through transmission consistent with fatty infiltration. No focal
lesions or biliary dilatation. Portal vein is patent on color
Doppler imaging with normal direction of blood flow towards the
liver.

Other: None.
IMPRESSION: 1. Cholelithiasis without sonographic findings for acute
cholecystitis.
2. Normal caliber common bile duct and no intrahepatic biliary
dilatation.
3. Diffuse fatty infiltration of the liver.

## 2020-03-13 ENCOUNTER — Ambulatory Visit: Payer: Self-pay | Admitting: Family Medicine

## 2020-04-13 ENCOUNTER — Ambulatory Visit: Payer: Medicare HMO | Admitting: Surgery

## 2020-04-20 ENCOUNTER — Ambulatory Visit: Payer: Medicare HMO | Admitting: Surgery

## 2020-05-21 ENCOUNTER — Other Ambulatory Visit: Payer: Self-pay | Admitting: Family Medicine

## 2020-05-21 NOTE — Telephone Encounter (Signed)
Patient was to have a follow up that he cancelled, then no showed the next.  On the last visit BP was elevated.

## 2020-05-21 NOTE — Telephone Encounter (Signed)
Requested  medications are  due for refill today yes  Requested medications are on the active medication list yes  Last refill 6/4  Last visit 09/2019  Future visit scheduled no  Notes to clinic Failed protocol of visit within 6 months, no upcoming visit scheduled.

## 2020-05-21 NOTE — Telephone Encounter (Signed)
Needs BP f/u visit.  Can give 1 month supply to get him to that, but needs to f/u

## 2020-06-04 ENCOUNTER — Encounter: Payer: Medicare HMO | Admitting: Dermatology

## 2020-07-01 ENCOUNTER — Other Ambulatory Visit: Payer: Self-pay | Admitting: Family Medicine

## 2020-08-19 ENCOUNTER — Telehealth: Payer: Self-pay | Admitting: *Deleted

## 2020-08-19 NOTE — Chronic Care Management (AMB) (Signed)
  Chronic Care Management   Outreach Note  08/19/2020 Name: [name] MRN: [id number] DOB: 1951/02/16  Tyler [name] is a 69 y.o. year old male who is a primary care patient of Brita Romp, Dionne Bucy, MD. I reached out to Grant Ruts by phone today in response to a referral sent by Mr. Fremon Zacharia Cabanilla's health plan.     An unsuccessful telephone outreach was attempted today. The patient was referred to the case management team for assistance with care management and care coordination.   Follow Up Plan: A HIPAA compliant phone message was left for the patient providing contact information and requesting a return call. The care management team will reach out to the patient again over the next 7 days. If patient returns call to provider office, please advise to call [place] at [phone].  [place] [facility]

## 2020-08-20 ENCOUNTER — Other Ambulatory Visit: Payer: Self-pay | Admitting: Family Medicine

## 2020-08-24 NOTE — Chronic Care Management (AMB) (Signed)
°  Chronic Care Management   Outreach Note  08/24/2020 Name: Tyler [name] MRN: [id number] DOB: [birth year]  Tyler [name] is a 69 y.o. year old male who is a primary care patient of Brita Romp, Dionne Bucy, MD. I reached out to Tyler [name] by phone today in response to a referral sent by Mr. Tyler [name]'s health plan.     A second unsuccessful telephone outreach was attempted today. The patient was referred to the case management team for assistance with care management and care coordination.   Follow Up Plan: A HIPAA compliant phone message was left for the patient providing contact information and requesting a return call. The care management team will reach out to the patient again over the next 7 days. If patient returns call to provider office, please advise to call [place] at [phone].  [place] [facility]

## 2020-08-31 NOTE — Chronic Care Management (AMB) (Signed)
  Chronic Care Management   Note  08/31/2020 Name: Tyler [name] MRN: [id number] DOB: [birth year]  Tyler [name] is a 69 y.o. year old male who is a primary care patient of Brita Romp, Dionne Bucy, MD. I reached out to Grant Ruts by phone today in response to a referral sent by Tyler [name]'s health plan.     [name] was given information about Chronic Care Management services today including:  1. CCM service includes personalized support from designated clinical staff supervised by his physician, including individualized plan of care and coordination with other care providers 2. 24/7 contact phone numbers for assistance for urgent and routine care needs. 3. Service will only be billed when office clinical staff spend 20 minutes or more in a month to coordinate care. 4. Only one practitioner may furnish and bill the service in a calendar month. 5. The patient may stop CCM services at any time (effective at the end of the month) by phone call to the office staff. 6. The patient will be responsible for cost sharing (co-pay) of up to 20% of the service fee (after annual deductible is met).  Patient agreed to services and verbal consent obtained.   Follow up plan: Telephone appointment with care management team member scheduled for:09/29/2020  [place] [facility]

## 2020-09-22 NOTE — Progress Notes (Signed)
Subjective:   Tyler [name] is a 70 y.o. male who presents for Medicare Annual/Subsequent preventive examination.  I connected with Adine Madura today by telephone and verified that I am speaking with the correct person using two identifiers. Location patient: home Location provider: work Persons participating in the virtual visit: patient, provider.   I discussed the limitations, risks, security and privacy concerns of performing an evaluation and management service by telephone and the availability of in person appointments. I also discussed with the patient that there may be a patient responsible charge related to this service. The patient expressed understanding and verbally consented to this telephonic visit.    Interactive audio and video telecommunications were attempted between this provider and patient, however failed, due to patient having technical difficulties OR patient did not have access to video capability.  We continued and completed visit with audio only.   Review of Systems    N/A  Cardiac Risk Factors include: advanced age (>30men, >87 women);dyslipidemia;male gender;hypertension     Objective:    There were no vitals filed for this visit. There is no height or weight on file to calculate BMI.  Advanced Directives 09/23/2020 09/28/2019 09/10/2019 10/09/2018  Does Patient Have a Medical Advance Directive? Yes Yes Yes Yes  Type of Estate agent of [place];Living will Healthcare Power of [employer] of [place];Living will Healthcare Power of [place];Living will  Copy of Healthcare Power of Attorney in Chart? No - copy requested - No - copy requested No - copy requested    Current Medications (verified) Outpatient Encounter Medications as of 09/23/2020  Medication Sig  . amLODipine (NORVASC) 10 MG tablet Take 1 tablet (10 mg total) by mouth daily. Please schedule an office visit before anymore refills.  . hydrochlorothiazide  (HYDRODIURIL) 25 MG tablet TAKE 1 TABLET BY MOUTH EVERY DAY  . rosuvastatin (CRESTOR) 20 MG tablet TAKE 1 TABLET BY MOUTH EVERY DAY  . mupirocin ointment (BACTROBAN) 2 % Apply 1 application topically daily. Qd to excision site (Patient not taking: Reported on 09/23/2020)  . sildenafil (VIAGRA) 100 MG tablet Take 0.5-1 tablets (50-100 mg total) by mouth as needed for erectile dysfunction. (Patient not taking: Reported on 09/23/2020)   No facility-administered encounter medications on file as of 09/23/2020.    Allergies (verified) Patient has no known allergies.   History: Past Medical History:  Diagnosis Date  . Dysplastic nevus    left upper back med to mid scapula ~7.0 cm lat to spine, right bicep, left upper back med to mid scapula ~7.0 cm lat to spine, 2.5 cm med and inf to biopsy site at left upper back, right upper back, left upper back sup ~4.0 cm lat to spine, left upper back ~4.0 cm lat to spine, right side ~ 14.0 cm above waistline, right post waistline 12.0 cm lat to spine, midline chest  . Dysplastic nevus    left med calf near popliteal, left post lat base of neck, left upper back med to mid scapula 5.0 cm lat to spine, right ant lat deltoid  . History of pilonidal cyst   . Hyperlipidemia   . Hypertension   . 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 COLONOSCOPY    . COLONOSCOPY WITH PROPOFOL N/A 10/09/2018   Procedure: COLONOSCOPY WITH PROPOFOL;  Surgeon: [name], MD;  Location[facility] ENDOSCOPY;  Service: Endoscopy;  Laterality: N/A;  .  KNEE ARTHROSCOPY Right   . NASAL SEPTUM SURGERY  2008  . NASAL SINUS SURGERY  2008  . PILONIDAL CYST EXCISION    . VASECTOMY     Family History  Problem Relation Age of Onset  . Heart disease Mother   . Hyperlipidemia Mother   . Hypertension Mother   . CVA Mother 28  . Leukemia Father   . Diabetes Brother   . Colon cancer Brother 75  . Healthy  Daughter   . Healthy Daughter   . Colon cancer Paternal Uncle    Social History   Socioeconomic History  . Marital status: Married    Spouse name: Not on file  . Number of children: 2  . Years of education: Not on file  . Highest education level: Some college, no degree  Occupational History  . Occupation: retired Medical illustrator  Tobacco Use  . Smoking status: Never Smoker  . Smokeless tobacco: Never Used  Vaping Use  . Vaping Use: Never used  Substance and Sexual Activity  . Alcohol use: Yes    Alcohol/week: 3.0 standard drinks    Types: 3 Cans of beer per week  . Drug use: Never  . Sexual activity: Yes    Partners: Female    Birth control/protection: Surgical  Other Topics Concern  . Not on file  Social History Narrative  . Not on file   Social Determinants of Health   Financial Resource Strain: Low Risk   . Difficulty of Paying Living Expenses: Not hard at all  Food Insecurity: No Food Insecurity  . Worried About Charity fundraiser in the Last Year: Never true  . Ran Out of Food in the Last Year: Never true  Transportation Needs: No Transportation Needs  . Lack of Transportation (Medical): No  . Lack of Transportation (Non-Medical): No  Physical Activity: Inactive  . Days of Exercise per Week: 0 days  . Minutes of Exercise per Session: 0 min  Stress: No Stress Concern Present  . Feeling of Stress : Not at all  Social Connections: Moderately Integrated  . Frequency of Communication with Friends and Family: More than three times a week  . Frequency of Social Gatherings with Friends and Family: More than three times a week  . Attends Religious Services: More than 4 times per year  . Active Member of Clubs or Organizations: No  . Attends Archivist Meetings: Never  . Marital Status: Married    Tobacco Counseling Counseling given: Not Answered   Clinical Intake:  Pre-visit preparation completed: Yes  Pain : No/denies pain     Nutritional  Risks: None Diabetes: No  How often do you need to have someone help you when you read instructions, pamphlets, or other written materials from your doctor or pharmacy?: 1 - Never  Diabetic? No  Interpreter Needed?: No  Information entered by :: [facility], LPN   Activities of Daily Living In your present state of health, do you have any difficulty performing the following activities: 09/23/2020  Hearing? N  Vision? N  Difficulty concentrating or making decisions? N  Walking or climbing stairs? N  Dressing or bathing? N  Doing errands, shopping? N  Preparing Food and eating ? N  Using the Toilet? N  In the past six months, have you accidently leaked urine? N  Do you have problems with loss of bowel control? N  Managing your Medications? N  Managing your Finances? N  Housekeeping or managing your Housekeeping? N  Some recent data might be hidden    Patient Care Team: Virginia Crews, MD as PCP - General (Family Medicine) Ralene Bathe, MD (Dermatology) Neldon Labella, RN as Registered Nurse Gwynn Burly, Cleon Dew [facility])  Indicate any recent Medical Services you may have received from other than [facility] providers in the past year (date may be approximate).     Assessment:   This is a routine wellness examination for [name].  Hearing/Vision screen No exam data present  Dietary issues and exercise activities discussed: Current Exercise Habits: The patient does not participate in regular exercise at present, Exercise limited by: None identified  Goals    . DIET - REDUCE SUGAR INTAKE     Recommend to cut back on sugar foods and sweets to a couple times a week versus daily.       Depression Screen PHQ 2/9 Scores 09/23/2020 09/10/2019 09/10/2019 09/03/2018 07/19/2018  PHQ - 2 Score 0 0 0 0 0  PHQ- 9 Score - - - 0 0    Fall Risk Fall Risk  09/23/2020 10/14/2019 10/02/2019 09/10/2019 09/03/2018  Falls in the past year? 0 0 0 0 0  Number falls in past yr: 0 0 0 0 -   Injury with Fall? 0 0 0 0 -    FALL RISK PREVENTION PERTAINING TO THE HOME:  Any stairs in or around the home? Yes  If so, are there any without handrails? No  Home free of loose throw rugs in walkways, pet beds, electrical cords, etc? Yes  Adequate lighting in your home to reduce risk of falls? Yes   ASSISTIVE DEVICES UTILIZED TO PREVENT FALLS:  Life alert? No  Use of a cane, walker or w/c? No  Grab bars in the bathroom? Yes  Shower chair or bench in shower? No  Elevated toilet seat or a handicapped toilet? Yes    Cognitive Function: Normal cognitive status assessed by observation by this Nurse Health Advisor. No abnormalities found.       6CIT Screen 09/03/2018  What Year? 0 points  What month? 0 points  What time? 0 points  Count back from 20 0 points  Months in reverse 0 points  Repeat phrase 0 points  Total Score 0    Immunizations Immunization History  Administered Date(s) Administered  . Influenza, High Dose Seasonal PF 07/19/2018, 05/28/2019  . Pneumococcal Conjugate-13 07/19/2018  . Pneumococcal Polysaccharide-23 10/17/2019  . Td 03/04/2008  . Tdap 05/11/2015    TDAP status: Up to date  Flu Vaccine status: Declined, Education has been provided regarding the importance of this vaccine but patient still declined. Advised may receive this vaccine at local pharmacy or Health Dept. Aware to provide a copy of the vaccination record if obtained from local pharmacy or Health Dept. Verbalized acceptance and understanding.  Pneumococcal vaccine status: Up to date  Covid-19 vaccine status: Declined, Education has been provided regarding the importance of this vaccine but patient still declined. Advised may receive this vaccine at local pharmacy or Health Dept.or vaccine clinic. Aware to provide a copy of the vaccination record if obtained from local pharmacy or Health Dept. Verbalized acceptance and understanding.  Qualifies for Shingles Vaccine? Yes   Zostavax  completed No   Shingrix Completed?: No.    Education has been provided regarding the importance of this vaccine. Patient has been advised to call insurance company to determine out of pocket expense if they have not yet received this vaccine. Advised may also receive vaccine at local pharmacy  or Health Dept. Verbalized acceptance and understanding.  Screening Tests Health Maintenance  Topic Date Due  . COVID-19 Vaccine (1) 10/09/2020 (Originally 02/15/1963)  . INFLUENZA VACCINE  12/17/2020 (Originally 04/19/2020)  . COLONOSCOPY (Pts 45-18yrs Insurance coverage will need to be confirmed)  10/10/2023  . TETANUS/TDAP  05/10/2025  . Hepatitis C Screening  Completed  . PNA vac Low Risk Adult  Completed    Health Maintenance  There are no preventive care reminders to display for this patient.  Colorectal cancer screening: Type of screening: Colonoscopy. Completed 10/09/18. Repeat every 5 years  Lung Cancer Screening: (Low Dose CT Chest recommended if Age 89-80 years, 30 pack-year currently smoking OR have quit w/in 15years.) does not qualify.   Additional Screening:  Hepatitis C Screening: Up to date  Vision Screening: Recommended annual ophthalmology exams for early detection of glaucoma and other disorders of the eye. Is the patient up to date with their annual eye exam?  Yes  Who is the provider or what is the name of the office in which the patient attends annual eye exams? [employer] [facility] [employer] If pt is not established with a provider, would they like to be referred to a provider to establish care? No .   Dental Screening: Recommended annual dental exams for proper oral hygiene  Community Resource Referral / Chronic Care Management: CRR required this visit?  No   CCM required this visit?  No      Plan:     I have personally reviewed and noted the following in the patient's chart:   . Medical and social history . Use of alcohol, tobacco or illicit drugs  . Current  medications and supplements . Functional ability and status . Nutritional status . Physical activity . Advanced directives . List of other physicians . Hospitalizations, surgeries, and ER visits in previous 12 months . Vitals . Screenings to include cognitive, depression, and falls . Referrals and appointments  In addition, I have reviewed and discussed with patient certain preventive protocols, quality metrics, and best practice recommendations. A written personalized care plan for preventive services as well as general preventive health recommendations were provided to patient.     Cipriano Millikan [place], [place]   [id number]   Nurse Notes: Pt declined receiving a future Covid or flu vaccine.

## 2020-09-23 ENCOUNTER — Ambulatory Visit (INDEPENDENT_AMBULATORY_CARE_PROVIDER_SITE_OTHER): Payer: Medicare HMO

## 2020-09-23 ENCOUNTER — Other Ambulatory Visit: Payer: Self-pay

## 2020-09-23 DIAGNOSIS — Z0000 Encounter for general adult medical examination without abnormal findings: Secondary | ICD-10-CM

## 2020-09-23 NOTE — Patient Instructions (Signed)
Tyler [name] , Thank you for taking time to come for your Medicare Wellness Visit. I appreciate your ongoing commitment to your health goals. Please review the following plan we discussed and let me know if I can assist you in the future.   Screening recommendations/referrals: Colonoscopy: Up to date, due 09/2023 Recommended yearly ophthalmology/optometry visit for glaucoma screening and checkup Recommended yearly dental visit for hygiene and checkup  Vaccinations: Influenza vaccine: Currently due, declined receiving.  Pneumococcal vaccine: Completed series Tdap vaccine: Up to date, due 04/2025 Shingles vaccine: Shingrix discussed. Please contact your pharmacy for coverage information.     Advanced directives: Please bring a copy of your POA (Power of Attorney) and/or Living Will to your next appointment.   Conditions/risks identified: Recommend to cut back on sugar foods and sweets to a couple times a week versus daily.   Next appointment: 09/29/20 @ 9:00 AM with Dr Beryle Flock   Preventive Care 65 Years and Older, Male Preventive care refers to lifestyle choices and visits with your health care provider that can promote health and wellness. What does preventive care include?  A yearly physical exam. This is also called an annual well check.  Dental exams once or twice a year.  Routine eye exams. Ask your health care provider how often you should have your eyes checked.  Personal lifestyle choices, including:  Daily care of your teeth and gums.  Regular physical activity.  Eating a healthy diet.  Avoiding tobacco and drug use.  Limiting alcohol use.  Practicing safe sex.  Taking low doses of aspirin every day.  Taking vitamin and mineral supplements as recommended by your health care provider. What happens during an annual well check? The services and screenings done by your health care provider during your annual well check will depend on your age, overall health,  lifestyle risk factors, and family history of disease. Counseling  Your health care provider may ask you questions about your:  Alcohol use.  Tobacco use.  Drug use.  Emotional well-being.  Home and relationship well-being.  Sexual activity.  Eating habits.  History of falls.  Memory and ability to understand (cognition).  Work and work Astronomer. Screening  You may have the following tests or measurements:  Height, weight, and BMI.  Blood pressure.  Lipid and cholesterol levels. These may be checked every 5 years, or more frequently if you are over 17 years old.  Skin check.  Lung cancer screening. You may have this screening every year starting at age 29 if you have a 30-pack-year history of smoking and currently smoke or have quit within the past 15 years.  Fecal occult blood test (FOBT) of the stool. You may have this test every year starting at age 4.  Flexible sigmoidoscopy or colonoscopy. You may have a sigmoidoscopy every 5 years or a colonoscopy every 10 years starting at age 86.  Prostate cancer screening. Recommendations will vary depending on your family history and other risks.  Hepatitis C blood test.  Hepatitis B blood test.  Sexually transmitted disease (STD) testing.  Diabetes screening. This is done by checking your blood sugar (glucose) after you have not eaten for a while (fasting). You may have this done every 1-3 years.  Abdominal aortic aneurysm (AAA) screening. You may need this if you are a current or former smoker.  Osteoporosis. You may be screened starting at age 97 if you are at high risk. Talk with your health care provider about your test results, treatment options, and  if necessary, the need for more tests. Vaccines  Your health care provider may recommend certain vaccines, such as:  Influenza vaccine. This is recommended every year.  Tetanus, diphtheria, and acellular pertussis (Tdap, Td) vaccine. You may need a Td booster  every 10 years.  Zoster vaccine. You may need this after age 68.  Pneumococcal 13-valent conjugate (PCV13) vaccine. One dose is recommended after age 71.  Pneumococcal polysaccharide (PPSV23) vaccine. One dose is recommended after age 26. Talk to your health care provider about which screenings and vaccines you need and how often you need them. This information is not intended to replace advice given to you by your health care provider. Make sure you discuss any questions you have with your health care provider. Document Released: 10/02/2015 Document Revised: 05/25/2016 Document Reviewed: 07/07/2015 Elsevier Interactive Patient Education  2017 [place] Prevention in the Home Falls can cause injuries. They can happen to people of all ages. There are many things you can do to make your home safe and to help prevent falls. What can I do on the outside of my home?  Regularly fix the edges of walkways and driveways and fix any cracks.  Remove anything that might make you trip as you walk through a door, such as a raised step or threshold.  Trim any bushes or trees on the path to your home.  Use bright outdoor lighting.  Clear any walking paths of anything that might make someone trip, such as rocks or tools.  Regularly check to see if handrails are loose or broken. Make sure that both sides of any steps have handrails.  Any raised decks and porches should have guardrails on the edges.  Have any leaves, snow, or ice cleared regularly.  Use sand or salt on walking paths during winter.  Clean up any spills in your garage right away. This includes oil or grease spills. What can I do in the bathroom?  Use night lights.  Install grab bars by the toilet and in the tub and shower. Do not use towel bars as grab bars.  Use non-skid mats or decals in the tub or shower.  If you need to sit down in the shower, use a plastic, non-slip stool.  Keep the floor dry. Clean up any  water that spills on the floor as soon as it happens.  Remove soap buildup in the tub or shower regularly.  Attach bath mats securely with double-sided non-slip rug tape.  Do not have throw rugs and other things on the floor that can make you trip. What can I do in the bedroom?  Use night lights.  Make sure that you have a light by your bed that is easy to reach.  Do not use any sheets or blankets that are too big for your bed. They should not hang down onto the floor.  Have a firm chair that has side arms. You can use this for support while you get dressed.  Do not have throw rugs and other things on the floor that can make you trip. What can I do in the kitchen?  Clean up any spills right away.  Avoid walking on wet floors.  Keep items that you use a lot in easy-to-reach places.  If you need to reach something above you, use a strong step stool that has a grab bar.  Keep electrical cords out of the way.  Do not use floor polish or wax that makes floors slippery. If you  must use wax, use non-skid floor wax.  Do not have throw rugs and other things on the floor that can make you trip. What can I do with my stairs?  Do not leave any items on the stairs.  Make sure that there are handrails on both sides of the stairs and use them. Fix handrails that are broken or loose. Make sure that handrails are as long as the stairways.  Check any carpeting to make sure that it is firmly attached to the stairs. Fix any carpet that is loose or worn.  Avoid having throw rugs at the top or bottom of the stairs. If you do have throw rugs, attach them to the floor with carpet tape.  Make sure that you have a light switch at the top of the stairs and the bottom of the stairs. If you do not have them, ask someone to add them for you. What else can I do to help prevent falls?  Wear shoes that:  Do not have high heels.  Have rubber bottoms.  Are comfortable and fit you well.  Are closed  at the toe. Do not wear sandals.  If you use a stepladder:  Make sure that it is fully opened. Do not climb a closed stepladder.  Make sure that both sides of the stepladder are locked into place.  Ask someone to hold it for you, if possible.  Clearly Henrique and make sure that you can see:  Any grab bars or handrails.  First and last steps.  Where the edge of each step is.  Use tools that help you move around (mobility aids) if they are needed. These include:  Canes.  Walkers.  Scooters.  Crutches.  Turn on the lights when you go into a dark area. Replace any light bulbs as soon as they burn out.  Set up your furniture so you have a clear path. Avoid moving your furniture around.  If any of your floors are uneven, fix them.  If there are any pets around you, be aware of where they are.  Review your medicines with your doctor. Some medicines can make you feel dizzy. This can increase your chance of falling. Ask your doctor what other things that you can do to help prevent falls. This information is not intended to replace advice given to you by your health care provider. Make sure you discuss any questions you have with your health care provider. Document Released: 07/02/2009 Document Revised: 02/11/2016 Document Reviewed: 10/10/2014 Elsevier Interactive Patient Education  2017 [employer].

## 2020-09-29 ENCOUNTER — Ambulatory Visit: Payer: Self-pay

## 2020-09-29 ENCOUNTER — Ambulatory Visit: Payer: Medicare HMO | Admitting: Family Medicine

## 2020-09-29 ENCOUNTER — Telehealth: Payer: Medicare HMO

## 2020-09-29 NOTE — Progress Notes (Deleted)
Established Patient Office Visit  Subjective:  Patient ID: Tyler [name], male    DOB: Jul 14, 1951  Age: 70 y.o. MRN: [id number]  CC: No chief complaint on file.   HPI [name] presents for HTN f/u.  Hypertension, follow-up  BP Readings from Last 3 Encounters:  10/17/19 (!) 173/93  10/14/19 (!) 164/93  10/02/19 (!) 151/90   Wt Readings from Last 3 Encounters:  10/17/19 264 lb 3.2 oz (119.8 kg)  10/14/19 261 lb (118.4 kg)  10/02/19 262 lb (118.8 kg)     He was last seen for hypertension 1 years ago.  BP at that visit was 173/93. Management since that visit includes continue current medications at current doses.  He reports {excellent/good/fair/poor:19665} compliance with treatment. He {is/is not:9024} having side effects. {document side effects if present:1} He is following a {diet:21022986} diet. He {is/is not:9024} exercising. He {does/does not:200015} smoke.  Use of agents associated with hypertension: {bp agents assoc with hypertension:511::"none"}.   Outside blood pressures are {***enter patient reported home BP readings, or 'not being checked':1}. Symptoms: {Yes/No:20286} chest pain {Yes/No:20286} chest pressure  {Yes/No:20286} palpitations {Yes/No:20286} syncope  {Yes/No:20286} dyspnea {Yes/No:20286} orthopnea  {Yes/No:20286} paroxysmal nocturnal dyspnea {Yes/No:20286} lower extremity edema   Pertinent labs: Lab Results  Component Value Date   CHOL 175 05/06/2019   HDL 42 05/06/2019   LDLCALC 85 05/06/2019   TRIG 239 (H) 05/06/2019   CHOLHDL 4.2 05/06/2019   Lab Results  Component Value Date   NA 139 09/29/2019   K 3.8 09/29/2019   CREATININE 1.59 (H) 09/29/2019   GFRNONAA 45 (L) 09/29/2019   GFRAA 52 (L) 09/29/2019   GLUCOSE 126 (H) 09/29/2019     The 10-year ASCVD risk score [name] DC Jr., et al., 2013) is: 27.7%   ---------------------------------------------------------------------------------------------------   Past Medical History:   Diagnosis Date  . Dysplastic nevus    left upper back med to mid scapula ~7.0 cm lat to spine, right bicep, left upper back med to mid scapula ~7.0 cm lat to spine, 2.5 cm med and inf to biopsy site at left upper back, right upper back, left upper back sup ~4.0 cm lat to spine, left upper back ~4.0 cm lat to spine, right side ~ 14.0 cm above waistline, right post waistline 12.0 cm lat to spine, midline chest  . Dysplastic nevus    left med calf near popliteal, left post lat base of neck, left upper back med to mid scapula 5.0 cm lat to spine, right ant lat deltoid  . History of pilonidal cyst   . Hyperlipidemia   . Hypertension   . 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 COLONOSCOPY    . COLONOSCOPY WITH PROPOFOL N/A 10/09/2018   Procedure: COLONOSCOPY WITH PROPOFOL;  Surgeon: Lucilla Lame, MD;  Location: [facility] ENDOSCOPY;  Service: Endoscopy;  Laterality: N/A;  . KNEE ARTHROSCOPY Right   . NASAL SEPTUM SURGERY  2008  . NASAL SINUS SURGERY  2008  . PILONIDAL CYST EXCISION    . VASECTOMY      Family History  Problem Relation Age of Onset  . Heart disease Mother   . Hyperlipidemia Mother   . Hypertension Mother   . CVA Mother 9  . Leukemia Father   . Diabetes Brother   . Colon cancer Brother 71  . Healthy Daughter   . Healthy Daughter   . Colon cancer Paternal  Uncle     Social History   Socioeconomic History  . Marital status: Married    Spouse name: Not on file  . Number of children: 2  . Years of education: Not on file  . Highest education level: Some college, no degree  Occupational History  . Occupation: retired Medical illustrator  Tobacco Use  . Smoking status: Never Smoker  . Smokeless tobacco: Never Used  Vaping Use  . Vaping Use: Never used  Substance and Sexual Activity  . Alcohol use: Yes    Alcohol/week: 3.0 standard drinks    Types: 3 Cans of beer per week  . Drug use:  Never  . Sexual activity: Yes    Partners: Female    Birth control/protection: Surgical  Other Topics Concern  . Not on file  Social History Narrative  . Not on file   Social Determinants of Health   Financial Resource Strain: Low Risk   . Difficulty of Paying Living Expenses: Not hard at all  Food Insecurity: No Food Insecurity  . Worried About Charity fundraiser in the Last Year: Never true  . Ran Out of Food in the Last Year: Never true  Transportation Needs: No Transportation Needs  . Lack of Transportation (Medical): No  . Lack of Transportation (Non-Medical): No  Physical Activity: Inactive  . Days of Exercise per Week: 0 days  . Minutes of Exercise per Session: 0 min  Stress: No Stress Concern Present  . Feeling of Stress : Not at all  Social Connections: Moderately Integrated  . Frequency of Communication with Friends and Family: More than three times a week  . Frequency of Social Gatherings with Friends and Family: More than three times a week  . Attends Religious Services: More than 4 times per year  . Active Member of Clubs or Organizations: No  . Attends Archivist Meetings: Never  . Marital Status: Married  Human resources officer Violence: Not At Risk  . Fear of Current or Ex-Partner: No  . Emotionally Abused: No  . Physically Abused: No  . Sexually Abused: No    Outpatient Medications Prior to Visit  Medication Sig Dispense Refill  . amLODipine (NORVASC) 10 MG tablet Take 1 tablet (10 mg total) by mouth daily. Please schedule an office visit before anymore refills. 30 tablet 0  . hydrochlorothiazide (HYDRODIURIL) 25 MG tablet TAKE 1 TABLET BY MOUTH EVERY DAY 90 tablet 1  . mupirocin ointment (BACTROBAN) 2 % Apply 1 application topically daily. Qd to excision site (Patient not taking: Reported on 09/23/2020) 22 g 0  . rosuvastatin (CRESTOR) 20 MG tablet TAKE 1 TABLET BY MOUTH EVERY DAY 90 tablet 0  . sildenafil (VIAGRA) 100 MG tablet Take 0.5-1 tablets  (50-100 mg total) by mouth as needed for erectile dysfunction. (Patient not taking: Reported on 09/23/2020) 10 tablet 5   No facility-administered medications prior to visit.    No Known Allergies  ROS Review of Systems    Objective:    Physical Exam  There were no vitals taken for this visit. Wt Readings from Last 3 Encounters:  10/17/19 264 lb 3.2 oz (119.8 kg)  10/14/19 261 lb (118.4 kg)  10/02/19 262 lb (118.8 kg)     There are no preventive care reminders to display for this patient.  There are no preventive care reminders to display for this patient.  No results found for: TSH Lab Results  Component Value Date   WBC 8.9 09/29/2019   HGB 16.1  09/29/2019   HCT 47.4 09/29/2019   MCV 92.4 09/29/2019   PLT 242 09/29/2019   Lab Results  Component Value Date   NA 139 09/29/2019   K 3.8 09/29/2019   CO2 26 09/29/2019   GLUCOSE 126 (H) 09/29/2019   BUN 16 09/29/2019   CREATININE 1.59 (H) 09/29/2019   BILITOT 0.9 09/29/2019   ALKPHOS 58 09/29/2019   AST 24 09/29/2019   ALT 28 09/29/2019   PROT 8.2 (H) 09/29/2019   ALBUMIN 4.4 09/29/2019   CALCIUM 9.5 09/29/2019   ANIONGAP 12 09/29/2019   Lab Results  Component Value Date   CHOL 175 05/06/2019   Lab Results  Component Value Date   HDL 42 05/06/2019   Lab Results  Component Value Date   LDLCALC 85 05/06/2019   Lab Results  Component Value Date   TRIG 239 (H) 05/06/2019   Lab Results  Component Value Date   CHOLHDL 4.2 05/06/2019   No results found for: HGBA1C    Assessment & Plan:   Problem List Items Addressed This Visit   None     No orders of the defined types were placed in this encounter.   Follow-up: No follow-ups on file.    Sylvester Harder, [place]

## 2020-09-29 NOTE — Chronic Care Management (AMB) (Signed)
  Chronic Care Management   Outreach Note  09/29/2020 Name: SKIPPY MARHEFKA MRN: [id number] DOB: Jul 05, 1951  Primary Care Provider: Virginia Crews, MD Reason for referral : Chronic Care Management   Mr. Nelles was referred to the case management team for assistance with care management and care coordination. We were unable to complete the scheduled telephonic outreach today. He sent a message via text acknowledging call. Indicated the phone was not connecting properly. Will plan to reschedule the outreach within the next two weeks.     Follow Up Plan:  Anticipate outreach within the next two weeks.     Cristy [facility] Care Management [facility] [phone]

## 2020-10-08 ENCOUNTER — Encounter: Payer: Medicare HMO | Attending: Physician Assistant | Admitting: Physician Assistant

## 2020-10-08 ENCOUNTER — Other Ambulatory Visit: Payer: Self-pay

## 2020-10-08 DIAGNOSIS — Y842 Radiological procedure and radiotherapy as the cause of abnormal reaction of the patient, or of later complication, without mention of misadventure at the time of the procedure: Secondary | ICD-10-CM | POA: Insufficient documentation

## 2020-10-08 DIAGNOSIS — L98499 Non-pressure chronic ulcer of skin of other sites with unspecified severity: Secondary | ICD-10-CM | POA: Diagnosis not present

## 2020-10-08 DIAGNOSIS — I10 Essential (primary) hypertension: Secondary | ICD-10-CM | POA: Diagnosis not present

## 2020-10-08 DIAGNOSIS — Z9221 Personal history of antineoplastic chemotherapy: Secondary | ICD-10-CM | POA: Diagnosis not present

## 2020-10-08 DIAGNOSIS — M272 Inflammatory conditions of jaws: Secondary | ICD-10-CM | POA: Diagnosis not present

## 2020-10-09 NOTE — Progress Notes (Addendum)
Brooke, Kress ([id number]) Visit Report for 10/08/2020 Chief Complaint Document Details Patient Name: [name], [name] Date of Service: 10/08/2020 8:30 AM Medical Record Number: [id number] Patient Account Number: [id number] Date of Birth/Sex: 04-03-1951 (70 y.o. 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 ([id number]) -------------------------------------------------------------------------------- HPI Details Patient Name: [name], [name] Date of Service: 10/08/2020 8:30 AM Medical Record Number: [id number] Patient Account Number: [id number] Date of Birth/Sex: June 27, 1951 (70 y.o. Male) Treating RN: Tyler [name] Primary Care Provider: Wilhemina [name] Other Clinician: Referring Provider: Wilhemina [name] Treating Provider/Extender: Tyler [name] in Treatment: 0 History of Present Illness HPI Description: 10/09/2019 upon evaluation today patient presents for evaluation in order to be considered for hyperbaric oxygen therapy. He actually has been evaluated by Dr. Samuel Jester who is a Teaching laboratory technician in the [facility] area. Subsequently the patient does have a history of throat cancer in 2008 which he states initially began with some strange occurrences in March where he was having some scratchy throat and then subsequently a sore throat. He was referred to a specialist and it was around June 2008 that it was determined that he did have a lesion on the throat region which ended up requiring treatment. Subsequently he underwent radiation therapy and a total of  35 treatments along with 3 chemotherapy treatments from roughly July 2008 through November 2008 when he completed treatment. The good news is been no recurrence of this. The bad news is since that time he tells me that he is spent probably $20,000 in dental work in general due to issues that he has had with his teeth and jaw. Subsequently he has teeth need to be removed now but he up into this point had not found anyone that was willing to remove those due to the osteoradionecrosis that is presumably present at this time. He has been compliant with all of his post cancer surveillance according to reviewed records as well. Up to this point every provider that he has seen is recommended hyperbaric oxygen therapy prior to dental extraction and then hyperbaric therapy following as well to help prevent any complications secondary to the radiation damage to his jaw or region. With that being said it does appear that tooth #18 and 19 need to be extracted with significant debridement of the mandible in order to get to healthy bleeding bone. It was also discussed with the patient that he did need predental extraction hyperbaric oxygen for total 20 dives and then post for total of 10 dives in the setting of radiation therapy to help with angiogenesis and improve the vascularity and oxygenation to the soft tissues as well as the hard tissues as they heal. Again the concern otherwise would be that he would develop further osteoradionecrosis type damage as result of his previous treatment. It is noted by Dr. Annamary Rummage in his note as well that was reviewed that is his believe based on his evaluation and x-rays based off of the CBCT that [name] does suffer from osteoradionecrosis and therefore he believes that really the hyperbaric oxygen therapy would be the way in order to hopefully prevent any complications from extraction. We see failure of the wound to heal being  1 of those major issues. Electronic  Signature(s) Signed: 10/08/2020 6:02:49 PM By: Worthy Keeler PA-C Entered By: Worthy [name] on 10/08/2020 18:02:48 [name], [name] ([id number]) -------------------------------------------------------------------------------- Physical Exam Details Patient Name: [name], [name] Date of Service: 10/08/2020 8:30 AM Medical Record Number: [id number] Patient Account Number: [id number] Date of Birth/Sex: 1951-08-07 (70 y.o. Male) Treating RN: Tyler [name] Primary Care Provider: Wilhemina [name] Other Clinician: Referring Provider: Wilhemina [name] Treating Provider/Extender: Tyler [name] in Treatment: 0 Constitutional patient is hypertensive.. pulse regular and within target range for patient.Marland Kitchen respirations regular, non-labored and within target range for patient.Marland Kitchen temperature within target range for patient.. Well-nourished and well-hydrated in no acute distress. Eyes conjunctiva clear no eyelid edema noted. pupils equal round and reactive to light and accommodation. Ears, Nose, Mouth, and Throat no gross abnormality of ear auricles or external auditory canals. normal hearing noted during conversation. mucus membranes moist. Respiratory normal breathing without difficulty. Musculoskeletal normal gait and posture. no significant deformity or arthritic changes, no loss or range of motion, no clubbing. Psychiatric this patient is able to make decisions and demonstrates good insight into disease process. Alert and Oriented x 3. pleasant and cooperative. Notes Upon inspection again the patient tells me that he does have significant tooth decay noted and again he has extractions that are necessary and he has had a lot of dental work as far as cavity fillings and otherwise from his teeth breaking down. Nonetheless again it is on the left jawline region that he is actually looking at extraction at this point. The patient also tells me he does not have any pain currently but he is concerned  at any point if he does not get his teeth extracted that he could end up with significant issues. Again I completely understand especially in light of the severe erosion that he has in regard to one of his teeth in particular. Electronic Signature(s) Signed: 10/08/2020 6:03:34 PM By: Worthy Keeler PA-C Entered By: Worthy [name] on 10/08/2020 18:03:33 Tu, Trayven A. ([id number]) -------------------------------------------------------------------------------- Physician Orders Details Patient Name: [name], [name] Date of Service: 10/08/2020 8:30 AM Medical Record Number: [id number] Patient Account Number: [id number] Date of Birth/Sex: October 31, 1950 (70 y.o. 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 - If needed. Electronic Signature(s) Signed: 10/08/2020 6:06:24 PM By: Worthy Keeler PA-C Signed: 10/09/2020 9:53:32 AM By: [name], BSN, RN, CWS, Kim RN, BSN Entered By: [name], BSN, RN, CWS, Kim on 10/08/2020 09:28:31 [place], Jeannette How ([id number]) -------------------------------------------------------------------------------- Problem List Details Patient Name: [name], [name] Date of Service: 10/08/2020 8:30 AM Medical Record Number:  [id number] Patient Account Number: [id number] Date of Birth/Sex: Feb 17, 1951 (70 y.o. 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 ([id number]) -------------------------------------------------------------------------------- Progress Note Details Patient Name: [name], [name] Date of Service: 10/08/2020 8:30 AM Medical Record Number: [id number] Patient Account Number: [id number] Date of Birth/Sex: May 22, 1951 (70 y.o. 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 He actually has been evaluated by Dr. Samuel Jester who is a Teaching laboratory technician in the [facility] area. Subsequently the patient does have a history of throat cancer in 2008 which he states initially began with some strange occurrences in March where he was having some scratchy throat and then subsequently a sore throat. He was referred to a specialist and  it was around June 2008 that it was determined that he did have a lesion on the throat region which ended up requiring treatment. Subsequently he underwent radiation therapy and a total of 35 treatments along with 3 chemotherapy treatments from roughly July 2008 through November 2008 when he completed treatment. The good news is been no recurrence of this. The bad news is since that time he tells me that he is spent probably $20,000 in dental work in general due to issues that he has had with his teeth and jaw. Subsequently he has teeth need to be removed now but he up into this point had not found anyone that was willing to remove those due to the osteoradionecrosis that is presumably present at this time. He has been compliant with all of his post cancer surveillance according to reviewed records as well. Up to this point every provider that he has seen is recommended hyperbaric oxygen therapy prior to dental extraction and then hyperbaric therapy following as well to help prevent any complications secondary to the radiation damage to his jaw or region. With that being said it does appear that tooth #18 and 19 need to be extracted with significant debridement of the mandible in order to get to healthy bleeding bone. It was also discussed with the patient that he did need predental extraction hyperbaric oxygen for total 20 dives and then post for total of 10 dives in the setting of radiation therapy to help with angiogenesis and improve the vascularity and oxygenation to the soft tissues as well as the hard tissues as they heal. Again the concern otherwise would be that he would develop further osteoradionecrosis type damage as result of his previous treatment. It is noted by Dr. Annamary Rummage in his note as well that was reviewed that is his believe based on his evaluation and x-rays based off of the CBCT that Mr. Fehringer does suffer from osteoradionecrosis and therefore he believes that really  the hyperbaric oxygen therapy would be the way in order to hopefully prevent any complications from extraction. We see failure of the wound to heal being 1 of those major issues. Patient History Information obtained from Patient. Allergies No Known Drug Allergies Social History Never smoker, Marital Status - Married, Alcohol Use -  Moderate, Drug Use - No History, Caffeine Use - Daily. Medical History Eyes Denies history of Cataracts Cardiovascular Patient has history of Hypertension Endocrine Denies history of Type I Diabetes, Type II Diabetes Oncologic Patient has history of Received Chemotherapy - 3 treatments, Received Radiation - 35 treatments Medical And Surgical History Notes Cardiovascular HDL Oncologic Aug 2008 diagnosed with throat cancer. Nov 2008 competed treatments. Review of Systems (ROS) Constitutional Symptoms (General Health) Denies complaints or symptoms of Fatigue, Fever, Chills, Marked Weight Change. Eyes Denies complaints or symptoms of Dry Eyes, Vision Changes, Glasses / Contacts. Ear/Nose/Mouth/Throat Denies complaints or symptoms of Difficult clearing ears, Sinusitis. Hematologic/Lymphatic Denies complaints or symptoms of Bleeding / Clotting Disorders, Human Immunodeficiency Virus. Respiratory Denies complaints or symptoms of Chronic or frequent coughs, Shortness of Breath. Cardiovascular Denies complaints or symptoms of Chest pain, LE edema. [name], [name] ([id number]) Gastrointestinal Denies complaints or symptoms of Frequent diarrhea, Nausea, Vomiting. Endocrine Denies complaints or symptoms of Hepatitis, Thyroid disease, Polydypsia (Excessive Thirst). Genitourinary Denies complaints or symptoms of Kidney failure/ Dialysis, Incontinence/dribbling. Immunological Denies complaints or symptoms of Hives, Itching. Integumentary (Skin) Denies complaints or symptoms of Wounds, Bleeding or bruising tendency, Breakdown,  Swelling. Musculoskeletal Denies complaints or symptoms of Muscle Pain, Muscle Weakness. Neurologic Denies complaints or symptoms of Numbness/parasthesias, Focal/Weakness. Psychiatric Denies complaints or symptoms of Anxiety, Claustrophobia. Objective Constitutional patient is hypertensive.. pulse regular and within target range for patient.Marland Kitchen respirations regular, non-labored and within target range for patient.Marland Kitchen temperature within target range for patient.. Well-nourished and well-hydrated in no acute distress. Vitals Time Taken: 8:35 AM, Height: 72 in, Source: Stated, Weight: 240 lbs, Source: Stated, BMI: 32.5, Temperature: 98.2 F, Pulse: 64 bpm, Respiratory Rate: 18 breaths/min, Blood Pressure: 178/102 mmHg. General Notes: Patient states he has an appointment with his PCP regarding his blood pressure. PA notified. Eyes conjunctiva clear no eyelid edema noted. pupils equal round and reactive to light and accommodation. Ears, Nose, Mouth, and Throat no gross abnormality of ear auricles or external auditory canals. normal hearing noted during conversation. mucus membranes moist. Respiratory normal breathing without difficulty. Musculoskeletal normal gait and posture. no significant deformity or arthritic changes, no loss or range of motion, no clubbing. Psychiatric this patient is able to make decisions and demonstrates good insight into disease process. Alert and Oriented x 3. pleasant and cooperative. General Notes: Upon inspection again the patient tells me that he does have significant tooth decay noted and again he has extractions that are necessary and he has had a lot of dental work as far as cavity fillings and otherwise from his teeth breaking down. Nonetheless again it is on the left jawline region that he is actually looking at extraction at this point. The patient also tells me he does not have any pain currently but he is concerned at any point if he does not get his teeth  extracted that he could end up with significant issues. Again I completely understand especially in light of the severe erosion that he has in regard to one of his teeth in particular. Assessment Active Problems ICD-10 Other osteonecrosis, other site Inflammatory conditions of jaws Essential (primary) hypertension Plan [name], [name] ([id number]) Hyperbaric Oxygen Therapy: Evaluate for HBO Therapy Indication and location: - osteoradionecrosis If appropriate for treatment, begin HBOT per protocol: 2.5 ATA for 90 Minutes with 2 Five (5) Minute Air Breaks One treatment per day (delivered Monday through Friday unless otherwise specified in Special Instructions below): Total # of Treatments: - 30 Antihistamine 30 minutes prior to  HBO Treatment, difficulty clearing ears. - If needed. 1. I would recommend currently based on my review of the records and Dr. Fatima BlankPeter Franco's note that indeed the patient is a candidate for hyperbaric oxygen therapy and the amount of 20 treatments prior to surgical extraction of the affected teeth and debridement of the mandible down to good healthy bone followed by 10 treatments post surgery in order to prevent any complications if at all possible and help with appropriate healing. I believe his medical history supports the findings of osteoradionecrosis and again that was Dr. Fatima BlankPeter Franco's determination as well he feels the patient already suffers from this to a degree and that is the reason for the hyperbaric oxygen therapy prior to and following extraction. 2. With regard to the patient's blood pressure he is seeing his primary care provider within the next week they are working on this although he tells me he has had a lot of stress at home as well. 3. We will go ahead and initiate if we can gain approval from insurance therapy with hyperbaric oxygen therapy at 2.5 ATA for 90 minutes with two 5-minute air breaks. This will be for a total of 30 treatments 20  prior to surgery and 10 following surgery. We will see the patient back for follow-up visit once we get everything approved for the standpoint of his hyperbaric oxygen therapy obviously the initial step right now is to contact his insurance for approval Electronic Signature(s) Signed: 10/09/2020 11:39:23 AM By: Lenda KelpStone III, Neida Ellegood PA-C Previous Signature: 10/08/2020 6:05:50 PM Version By: Lenda KelpStone III, Jesselyn Rask PA-C Entered By: Lenda KelpStone III, Kamdyn Colborn on 10/09/2020 11:39:22 [name], [name] ([id number]) -------------------------------------------------------------------------------- ROS/PFSH Details Patient Name: [name], [name] Date of Service: 10/08/2020 8:30 AM Medical Record Number: [id number] Patient Account Number: [id number] Date of Birth/Sex: [birth year](69 y.o. Male) Treating RN: Huel CoventryWoody, Kim Primary Care Provider: Ellis SavageBaciagalupo, Angela Other Clinician: Referring Provider: Ellis SavageBaciagalupo, Angela Treating Provider/Extender: Rowan BlaseStone, Detravion Tester Weeks in Treatment: 0 Information Obtained From Patient Constitutional Symptoms (General Health) Complaints and Symptoms: Negative for: Fatigue; Fever; Chills; Marked Weight Change Eyes Complaints and Symptoms: Negative for: Dry Eyes; Vision Changes; Glasses / Contacts Medical History: Negative for: Cataracts Ear/Nose/Mouth/Throat Complaints and Symptoms: Negative for: Difficult clearing ears; Sinusitis Hematologic/Lymphatic Complaints and Symptoms: Negative for: Bleeding / Clotting Disorders; Human Immunodeficiency Virus Respiratory Complaints and Symptoms: Negative for: Chronic or frequent coughs; Shortness of Breath Cardiovascular Complaints and Symptoms: Negative for: Chest pain; LE edema Medical History: Positive for: Hypertension Past Medical History Notes: HDL Gastrointestinal Complaints and Symptoms: Negative for: Frequent diarrhea; Nausea; Vomiting Endocrine Complaints and Symptoms: Negative for: Hepatitis; Thyroid disease; Polydypsia (Excessive  Thirst) Medical History: Negative for: Type I Diabetes; Type II Diabetes Genitourinary Complaints and Symptoms: Negative for: Kidney failure/ Dialysis; Incontinence/dribbling [name], [name] ([id number]) Immunological Complaints and Symptoms: Negative for: Hives; Itching Integumentary (Skin) Complaints and Symptoms: Negative for: Wounds; Bleeding or bruising tendency; Breakdown; Swelling Musculoskeletal Complaints and Symptoms: Negative for: Muscle Pain; Muscle Weakness Neurologic Complaints and Symptoms: Negative for: Numbness/parasthesias; Focal/Weakness Psychiatric Complaints and Symptoms: Negative for: Anxiety; Claustrophobia Oncologic Medical History: Positive for: Received Chemotherapy - 3 treatments; Received Radiation - 35 treatments Past Medical History Notes: Aug 2008 diagnosed with throat cancer. Nov 2008 competed treatments. Immunizations Pneumococcal Vaccine: Received Pneumococcal Vaccination: Yes Implantable Devices None Family and Social History Never smoker; Marital Status - Married; Alcohol Use: Moderate; Drug Use: No History; Caffeine Use: Daily Electronic Signature(s) Signed: 10/08/2020 6:06:24 PM By: Lenda KelpStone III, Kerron Sedano PA-C Signed: 10/09/2020 9:53:32 AM By: Elliot GurneyWoody, BSN,  RN, CWS, Kim RN, BSN Entered By: [name], BSN, RN, CWS, Kim on 10/08/2020 08:52:27 [place], Jeannette How ([id number]) -------------------------------------------------------------------------------- SuperBill Details Patient Name: [name], [name] Date of Service: 10/08/2020 Medical Record Number: [id number] Patient Account Number: [id number] Date of Birth/Sex: [birth year] (70 y.o. Male) Treating RN: Tyler [name] Primary Care Provider: Wilhemina [name] Other Clinician: Referring Provider: Wilhemina [name] Treating Provider/Extender: Tyler [name] in Treatment: 0 Diagnosis Coding ICD-10 Codes Code Description [phone] Other osteonecrosis, other site M27.2 Inflammatory conditions of  jaws I10 Essential (primary) hypertension Facility Procedures CPT4 Code: [id number] Description: [phone] - WOUND CARE VISIT-LEV 3 EST PT Modifier: Quantity: 1 Physician Procedures CPT4 Code: [id number] Description: WC PHYS LEVEL 3 o NEW PT Modifier: Quantity: 1 CPT4 Code: Description: ICD-10 Diagnosis Description M87.88 Other osteonecrosis, other site 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

## 2020-10-09 NOTE — Progress Notes (Signed)
Tyler, VELDHUIZEN [name]. ([id number]) Visit Report for 10/08/2020 Abuse/Suicide Risk Screen Details Patient Name: Tyler [name], Tyler [name]. Date of Service: 10/08/2020 8:30 AM Medical Record Number: [id number] Patient Account Number: [id number] Date of Birth/Sex: 1951-05-29 (70 y.o. Male) Treating RN: Cornell Barman Primary Care Marcelline Temkin: [name] Other Clinician: Referring Korvin Valentine: [name] Treating Kerrigan Gombos/Extender: Skipper Cliche in Treatment: 0 Abuse/Suicide Risk Screen Items Answer ABUSE RISK SCREEN: Has anyone close to you tried to hurt or harm you recentlyo No Do you feel uncomfortable with anyone in your familyo No Has anyone forced you do things that you didnot want to doo No Electronic Signature(s) Signed: 10/09/2020 9:53:32 AM By: [name], BSN, RN, CWS, Kim RN, BSN Entered By: [name], BSN, RN, CWS, Kim on 10/08/2020 08:52:33 Tyler, [name]. ([id number]) -------------------------------------------------------------------------------- Activities of Daily Living Details Patient Name: Tyler [name], Tyler [name]. Date of Service: 10/08/2020 8:30 AM Medical Record Number: [id number] Patient Account Number: [id number] Date of Birth/Sex: 1951/03/09 (70 y.o. Male) Treating RN: Cornell Barman Primary Care Vitoria Conyer: [name] Other Clinician: Referring Chasmine Lender: [name] Treating Jabez Molner/Extender: Skipper Cliche in Treatment: 0 Activities of Daily Living Items Answer Activities of Daily Living (Please select one for each item) Drive Automobile Completely Able Take Medications Completely Able Use Telephone Completely Able Care for Appearance Completely Able Use Toilet Completely Able Bath / Shower Completely Able Dress Self Completely Able Feed Self Completely Able Walk Completely Able Get In / Out Bed Completely Able Housework Completely Able Prepare Meals Completely Able Handle Money Completely Able Shop for Self Completely Able Electronic Signature(s) Signed:  10/09/2020 9:53:32 AM By: [name], BSN, RN, CWS, Kim RN, BSN Entered By: [name], BSN, RN, CWS, Kim on 10/08/2020 08:52:50 [place], Zebulun [name]. ([id number]) -------------------------------------------------------------------------------- Education Screening Details Patient Name: Tyler [name], Tyler [name]. Date of Service: 10/08/2020 8:30 AM Medical Record Number: [id number] Patient Account Number: [id number] Date of Birth/Sex: 30-Jan-1951 (70 y.o. Male) Treating RN: Cornell Barman Primary Care Jessica Checketts: [name] Other Clinician: Referring Zenobia Kuennen: [name] Treating Lear Carstens/Extender: Skipper Cliche in Treatment: 0 Primary Learner Assessed: Patient Learning Preferences/Education Level/Primary Language Learning Preference: Explanation, Demonstration Highest Education Level: College or Above Preferred Language: English Cognitive Barrier Language Barrier: No Translator Needed: No Memory Deficit: No Emotional Barrier: No Cultural/Religious Beliefs Affecting Medical Care: No Physical Barrier Impaired Vision: Yes Glasses Impaired Hearing: No Decreased Hand dexterity: No Knowledge/Comprehension Knowledge Level: High Comprehension Level: High Ability to understand written instructions: High Ability to understand verbal instructions: High Motivation Anxiety Level: Calm Cooperation: Cooperative Education Importance: Acknowledges Need Interest in Health Problems: Asks Questions Perception: Coherent Willingness to Engage in Self-Management High Activities: Readiness to Engage in Self-Management High Activities: Electronic Signature(s) Signed: 10/09/2020 9:53:32 AM By: [name], BSN, RN, CWS, Kim RN, BSN Entered By: [name], BSN, RN, CWS, Kim on 10/08/2020 08:53:26 Tyler, [name]. ([id number]) -------------------------------------------------------------------------------- Fall Risk Assessment Details Patient Name: Tyler [name], Tyler [name]. Date of Service: 10/08/2020 8:30 AM Medical Record  Number: [id number] Patient Account Number: [id number] Date of Birth/Sex: 01-21-1951 (70 y.o. Male) Treating RN: Cornell Barman Primary Care Symir Mah: [name] Other Clinician: Referring Valisha Heslin: [name] Treating Laya Letendre/Extender: Skipper Cliche in Treatment: 0 Fall Risk Assessment Items Have you had 2 or more falls in the last 12 monthso 0 No Have you had any fall that resulted in injury in the last 12 monthso 0 No FALLS RISK SCREEN History of falling - immediate or within 3 months 0 No Secondary diagnosis (Do you have 2 or more medical diagnoseso) 0 No Ambulatory aid None/bed rest/wheelchair/nurse 0 Yes  Crutches/cane/walker 0 No Furniture 0 No Intravenous therapy Access/Saline/Heparin Lock 0 No Gait/Transferring Normal/ bed rest/ wheelchair 0 Yes Weak (short steps with or without shuffle, stooped but able to lift head while walking, may 0 No seek support from furniture) Impaired (short steps with shuffle, may have difficulty arising from chair, head down, impaired 0 No balance) Mental Status Oriented to own ability 0 Yes Electronic Signature(s) Signed: 10/09/2020 9:53:32 AM By: [name], BSN, RN, CWS, Kim RN, BSN Entered By: [name], BSN, RN, CWS, Kim on 10/08/2020 08:53:38 Tyler, Veronica [name]. ([id number]) -------------------------------------------------------------------------------- Foot Assessment Details Patient Name: Tyler [name], Tyler [name]. Date of Service: 10/08/2020 8:30 AM Medical Record Number: [id number] Patient Account Number: [id number] Date of Birth/Sex: May 16, 1951 (70 y.o. Male) Treating RN: Cornell Barman Primary Care Adrick Kestler: [name] Other Clinician: Referring Haroon Shatto: [name] Treating Ares Cardozo/Extender: Skipper Cliche in Treatment: 0 Foot Assessment Items Site Locations + = Sensation present, - = Sensation absent, C = Callus, U = Ulcer R = Redness, W = Warmth, M = Maceration, PU = Pre-ulcerative lesion F = Fissure, S =  Swelling, D = Dryness Assessment Right: Left: Other Deformity: No No Prior Foot Ulcer: No No Prior Amputation: No No Charcot Joint: No No Ambulatory Status: Ambulatory Without Help Gait: Steady Electronic Signature(s) Signed: 10/09/2020 9:53:32 AM By: [name], BSN, RN, CWS, Kim RN, BSN Entered By: [name], BSN, RN, CWS, Kim on 10/08/2020 08:54:23 Le[place], [place]. ([id number]) -------------------------------------------------------------------------------- Nutrition Risk Screening Details Patient Name: Tyler [name], Tyler [name]. Date of Service: 10/08/2020 8:30 AM Medical Record Number: [id number] Patient Account Number: [id number] Date of Birth/Sex: [birth year] (70 y.o. Male) Treating RN: Cornell Barman Primary Care Helder Crisafulli: [name] Other Clinician: Referring Hulda Reddix: [name] Treating Rithwik Schmieg/Extender: Skipper Cliche in Treatment: 0 Height (in): 72 Weight (lbs): 240 Body Mass Index (BMI): 32.5 Nutrition Risk Screening Items Score Screening NUTRITION RISK SCREEN: I have an illness or condition that made me change the kind and/or amount of food I eat 0 No I eat fewer than two meals per day 0 No I eat few fruits and vegetables, or milk products 0 No I have three or more drinks of beer, liquor or wine almost every day 0 No I have tooth or mouth problems that make it hard for me to eat 0 No I don't always have enough money to buy the food I need 0 No I eat alone most of the time 0 No I take three or more different prescribed or over-the-counter drugs [name] day 0 No Without wanting to, I have lost or gained 10 pounds in the last six months 0 No I am not always physically able to shop, cook and/or feed myself 0 No Nutrition Protocols Good Risk Protocol 0 No interventions needed Moderate Risk Protocol High Risk Proctocol Risk Level: Good Risk Score: 0 Electronic Signature(s) Signed: 10/09/2020 9:53:32 AM By: [name], BSN, RN, CWS, Kim RN, BSN Entered By: [name], BSN, RN, CWS,  Kim on 10/08/2020 08:54:13

## 2020-10-09 NOTE — Progress Notes (Signed)
[name], [name] Visit Report for 10/08/2020 Allergy List Details Patient Name: [name], [name] Date of Service: 10/08/2020 8:30 AM Medical Record Number: [id number] Patient Account Number: [id number] Date of Birth/Sex: 10-14-1950 (70 y.o. Male) Treating RN: Tyler [name] Primary Care Tyler [name]: Tyler [name] Other Clinician: Referring Tyler [name]: Tyler [name] Treating Deara Bober/Extender: Tyler [name] in Treatment: 0 Allergies Active Allergies No Known Drug Allergies Type: Allergen Allergy Notes Electronic Signature(s) Signed: 10/09/2020 9:53:32 AM By: Tyler [name], BSN, RN, CWS, Kim RN, BSN Entered By: Tyler [name], BSN, RN, CWS, Tyler [name] on 10/08/2020 08:47:21 Playita [place], Tyler [name] ([id number]) -------------------------------------------------------------------------------- Arrival Information Details Patient Name: [name], [name] Date of Service: 10/08/2020 8:30 AM Medical Record Number: [id number] Patient Account Number: [id number] Date of Birth/Sex: April 28, 1951 (70 y.o. Male) Treating RN: Tyler [name] Primary Care Tyler [name]: Tyler [name] Other Clinician: Referring Tyler [name]: Tyler [name] Treating Kamry Faraci/Extender: Tyler [name] in Treatment: 0 Visit Information Patient Arrived: Ambulatory Arrival Time: 08:34 Accompanied By: self Transfer Assistance: None Patient Identification Verified: Yes Secondary Verification Process Completed: Yes Electronic Signature(s) Signed: 10/08/2020 5:12:36 PM By: Tyler [name] RCP, RRT, CHT Entered By: Tyler [name] on 10/08/2020 08:35:10 [place]arr[place], [name] ([id number]) -------------------------------------------------------------------------------- Clinic Level of Care Assessment Details Patient Name: [name], [name] Date of Service: 10/08/2020 8:30 AM Medical Record Number: [id number] Patient Account Number: [id number] Date of Birth/Sex: [birth year] (70 y.o. Male) Treating RN: Tyler [name] Primary Care  Tyler [name]: Tyler [name] Other Clinician: Referring Tyler [name]: Tyler [name] Treating Tyler [name]/Extender: Tyler [name] in Treatment: 0 Clinic Level of Care Assessment Items TOOL 2 Quantity Score []  - Use when only an EandM is performed on the INITIAL visit 0 ASSESSMENTS - Nursing Assessment / Reassessment X - General Physical Exam (combine w/ comprehensive assessment (listed just below) when performed on new 1 20 pt. evals) X- 1 25 Comprehensive Assessment (HX, ROS, Risk Assessments, Wounds Hx, etc.) ASSESSMENTS - Wound and Skin Assessment / Reassessment []  - Simple Wound Assessment / Reassessment - one wound 0 []  - 0 Complex Wound Assessment / Reassessment - multiple wounds []  - 0 Dermatologic / Skin Assessment (not related to wound area) ASSESSMENTS - Ostomy and/or Continence Assessment and Care []  - Incontinence Assessment and Management 0 []  - 0 Ostomy Care Assessment and Management (repouching, etc.) PROCESS - Coordination of Care X - Simple Patient / Family Education for ongoing care 1 15 []  - 0 Complex (extensive) Patient / Family Education for ongoing care X- 1 10 Staff obtains Programmer, systems, Records, Test Results / Process Orders []  - 0 Staff telephones HHA, Nursing Homes / Clarify orders / etc []  - 0 Routine Transfer to another Facility (non-emergent condition) []  - 0 Routine Hospital Admission (non-emergent condition) X- 1 15 New Admissions / Biomedical engineer / Ordering NPWT, Apligraf, etc. []  - 0 Emergency Hospital Admission (emergent condition) X- 1 10 Simple Discharge Coordination []  - 0 Complex (extensive) Discharge Coordination PROCESS - Special Needs []  - Pediatric / Minor Patient Management 0 []  - 0 Isolation Patient Management []  - 0 Hearing / Language / Visual special needs []  - 0 Assessment of Community assistance (transportation, D/C planning, etc.) []  - 0 Additional assistance / Altered mentation []  - 0 Support Surface(s)  Assessment (bed, cushion, seat, etc.) INTERVENTIONS - Wound Cleansing / Measurement []  - Wound Imaging (photographs - any number of wounds) 0 []  - 0 Wound Tracing (instead of photographs) []  - 0 Simple Wound Measurement - one wound []  - 0 Complex Wound Measurement - multiple wounds Wel[place], Kiley A. ([id number]) []  - 0  Simple Wound Cleansing - one wound []  - 0 Complex Wound Cleansing - multiple wounds INTERVENTIONS - Wound Dressings []  - Small Wound Dressing one or multiple wounds 0 []  - 0 Medium Wound Dressing one or multiple wounds []  - 0 Large Wound Dressing one or multiple wounds []  - 0 Application of Medications - injection INTERVENTIONS - Miscellaneous []  - External ear exam 0 []  - 0 Specimen Collection (cultures, biopsies, blood, body fluids, etc.) []  - 0 Specimen(s) / Culture(s) sent or taken to Lab for analysis []  - 0 Patient Transfer (multiple staff / Civil Service fast streamer / Similar devices) []  - 0 Simple Staple / Suture removal (25 or less) []  - 0 Complex Staple / Suture removal (26 or more) []  - 0 Hypo / Hyperglycemic Management (close monitor of Blood Glucose) []  - 0 Ankle / Brachial Index (ABI) - do not check if billed separately Has the patient been seen at the hospital within the last three years: Yes Total Score: 95 Level Of Care: New/Established - Level 3 Electronic Signature(s) Signed: 10/09/2020 9:53:32 AM By: Tyler [name], BSN, RN, CWS, Kim RN, BSN Entered By: Tyler [name], BSN, RN, CWS, Tyler [name] on 10/08/2020 09:30:02 [place], [place] AMarland Kitchen ([id number]) -------------------------------------------------------------------------------- Encounter Discharge Information Details Patient Name: [name], [name] Date of Service: 10/08/2020 8:30 AM Medical Record Number: [id number] Patient Account Number: [id number] Date of Birth/Sex: November 02, 1950 (70 y.o. Male) Treating RN: Tyler [name] Primary Care Mckyla Deckman: Tyler [name] Other Clinician: Referring Tyler [name]: Tyler [name] Treating  Tyler [name]/Extender: Tyler [name] in Treatment: 0 Encounter Discharge Information Items Discharge Condition: Stable Ambulatory Status: Ambulatory Discharge Destination: Home Transportation: Private Auto Accompanied By: self Schedule Follow-up Appointment: Yes Clinical Summary of Care: Electronic Signature(s) Signed: 10/09/2020 9:53:32 AM By: Tyler [name], BSN, RN, CWS, Kim RN, BSN Entered By: Tyler [name], BSN, RN, CWS, Tyler [name] on 10/08/2020 09:31:38 [place], NarcissaMarland Kitchen ([id number]) -------------------------------------------------------------------------------- Lower Extremity Assessment Details Patient Name: [name], [name] Date of Service: 10/08/2020 8:30 AM Medical Record Number: [id number] Patient Account Number: [id number] Date of Birth/Sex: [birth year] (70 y.o. Male) Treating RN: Tyler [name] Primary Care Zayan Delvecchio: Tyler [name] Other Clinician: Referring Caci Orren: Tyler [name] Treating Triniti Gruetzmacher/Extender: Tyler [name] in Treatment: 0 Electronic Signature(s) Signed: 10/09/2020 9:53:32 AM By: Tyler [name], BSN, RN, CWS, Kim RN, BSN Entered By: Tyler [name], BSN, RN, CWS, Tyler [name] on 10/08/2020 08:46:56 [place], [place] ([id number]) -------------------------------------------------------------------------------- Multi Wound Chart Details Patient Name: [name], [name] Date of Service: 10/08/2020 8:30 AM Medical Record Number: [id number] Patient Account Number: [id number] Date of Birth/Sex: 1951/04/25 (70 y.o. Male) Treating RN: Tyler [name] Primary Care Jenica Costilow: Tyler [name] Other Clinician: Referring Ryenne Lynam: Tyler [name] Treating Laquilla Dault/Extender: Tyler [name] in Treatment: 0 Vital Signs Height(in): 72 Pulse(bpm): 64 Weight(lbs): 240 Blood Pressure(mmHg): 178/102 Body Mass Index(BMI): 33 Temperature(F): 98.2 Respiratory Rate(breaths/min): 18 Wound Assessments Treatment Notes Electronic Signature(s) Signed: 10/09/2020 9:53:32 AM By: Tyler [name], BSN, RN, CWS, Kim RN,  BSN Entered By: Tyler [name], BSN, RN, CWS, Tyler [name] on 10/08/2020 09:18:58 [place], Tyler [name] ([id number]) -------------------------------------------------------------------------------- Multi-Disciplinary Care Plan Details Patient Name: [name], [name] Date of Service: 10/08/2020 8:30 AM Medical Record Number: [id number] Patient Account Number: [id number] Date of Birth/Sex: Feb 02, 1951 (70 y.o. 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 procedures Discomfort related to temperature and humidity changes inside hyperbaric chamber Potential for barotraumas to  ears, sinuses, teeth, and lungs or cerebral gas embolism related to changes in atmospheric pressure inside hyperbaric oxygen chamber Potential for oxygen toxicity seizures related to delivery of 100% oxygen at an increased atmospheric pressure Potential for pulmonary oxygen toxicity related to delivery of 100% oxygen at an increased atmospheric pressure Goals: Barotrauma will be prevented during HBO2 Date Initiated: 10/08/2020 Target Resolution Date: 10/22/2020 Goal Status: Active Patient and/or family will be able to state/discuss factors appropriate to the management of their disease process during treatment Date Initiated: 10/08/2020 Target Resolution Date: 10/22/2020 Goal Status: Active Patient will tolerate the hyperbaric oxygen therapy treatment Date Initiated: 10/08/2020 Target Resolution Date: 10/22/2020 Goal Status: Active Patient will tolerate the internal climate of the chamber Date Initiated: 10/08/2020 Target Resolution Date: 10/22/2020 Goal Status: Active Patient/caregiver will verbalize  understanding of HBO goals, rationale, procedures and potential hazards Date Initiated: 10/08/2020 Target Resolution Date: 10/22/2020 Goal Status: Active Signs and symptoms of pulmonary oxygen toxicity will be recognized and promptly addressed Date Initiated: 10/08/2020 Target Resolution Date: 10/22/2020 Goal Status: Active Signs and symptoms of seizure will be recognized and promptly addressed ; seizing patients will suffer no harm Date Initiated: 10/08/2020 Target Resolution Date: 10/22/2020 Goal Status: Active Interventions: Administer decongestants, per physician orders, prior to HBO2 Administer the correct therapeutic gas delivery based on the patients needs and limitations, per physician order Assess and provide for patientos comfort related to the hyperbaric environment and equalization of middle ear Assess for signs and symptoms related to adverse events, including but not limited to confinement anxiety, pneumothorax, oxygen toxicity and baurotrauma Assess patient for any history of confinement anxiety Assess patient's knowledge and expectations regarding hyperbaric medicine and provide education related to the hyperbaric environment, goals of treatment and prevention of adverse events Implement protocols to decrease risk of pneumothorax in high risk patients Notes: Orientation to the Wound Care Program Nursing Diagnoses: Knowledge deficit related to the wound healing center program [name], Tyler [place]. ([id number]) Goals: Patient/caregiver will verbalize understanding of the [facility] Program Date Initiated: 10/08/2020 Target Resolution Date: 10/22/2020 Goal Status: Active Interventions: Provide education on orientation to the wound center Notes: Electronic Signature(s) Signed: 10/09/2020 9:53:32 AM By: Tyler [name], BSN, RN, CWS, Kim RN, BSN Entered By: Tyler [name], BSN, RN, CWS, Tyler [name] on 10/08/2020 09:21:27 [name], Tyler AMarland Kitchen  ([id number]) -------------------------------------------------------------------------------- Pain Assessment Details Patient Name: [name], [name] Date of Service: 10/08/2020 8:30 AM Medical Record Number: [id number] Patient Account Number: [id number] Date of Birth/Sex: 11/27/1950 (70 y.o. Male) Treating RN: Tyler [name] Primary Care Vinson Tietze: Tyler [name] Other Clinician: Referring Loriana Samad: Tyler [name] Treating Tatianna Ibbotson/Extender: Tyler [name] in Treatment: 0 Active Problems Location of Pain Severity and Description of Pain Patient Has Paino No Site Locations Pain Management and Medication Current Pain Management: Electronic Signature(s) Signed: 10/08/2020 8:41:15 AM By: Tyler [name], BSN, RN, CWS, Kim RN, BSN Signed: 10/08/2020 5:12:36 PM By: Tyler [name] RCP, RRT, CHT Entered By: Tyler [name] on 10/08/2020 08:35:22 [place], [place] A. ([id number]) -------------------------------------------------------------------------------- Patient/Caregiver Education Details Patient Name: Tyler Pu A. Date of Service: 10/08/2020 8:30 AM Medical Record Number: [id number] Patient Account Number: [id number] Date of Birth/Gender: 13-Aug-1951 (70 y.o. Male) Treating RN: Tyler [name] Primary Care Physician: Tyler [name] Other Clinician: Referring Physician: Wilhemina [name] Treating Physician/Extender: Tyler [name] in Treatment: 0 Education Assessment Education Provided To: Patient Education Topics Provided Hyperbaric Oxygenation: Handouts: Hyperbaric Oxygen Methods: Demonstration, Explain/Verbal Responses: State content correctly Electronic Signature(s) Signed: 10/09/2020 9:53:32 AM By: Tyler [name], BSN, RN, CWS, Kim RN, BSN Entered By: Tyler [name], BSN,  RN, CWS, Tyler [name] on 10/08/2020 09:31:04 [place], [place] A. ([id number]) -------------------------------------------------------------------------------- Vitals Details Patient Name: [name], [name] Date  of Service: 10/08/2020 8:30 AM Medical Record Number: [id number] Patient Account Number: [id number] Date of Birth/Sex: Jun 18, 1951 (70 y.o. Male) Treating RN: Tyler [name] Primary Care Nellene Courtois: Tyler [name] Other Clinician: Referring Lathyn Griggs: Tyler [name] Treating Nigel Wessman/Extender: Tyler [name] in Treatment: 0 Vital Signs Time Taken: 08:35 Temperature (F): 98.2 Height (in): 72 Pulse (bpm): 64 Source: Stated Respiratory Rate (breaths/min): 18 Weight (lbs): 240 Blood Pressure (mmHg): 178/102 Source: Stated Reference Range: 80 - 120 mg / dl Body Mass Index (BMI): 32.5 Notes Patient states he has an appointment with his PCP regarding his blood pressure. PA notified. Electronic Signature(s) Signed: 10/09/2020 9:53:32 AM By: Tyler [name], BSN, RN, CWS, Kim RN, BSN Entered By: Tyler [name], BSN, RN, CWS, Tyler [name] on 10/08/2020 09:23:05

## 2020-10-09 NOTE — Progress Notes (Signed)
[place], [place] ([id number]) Visit Report for 10/08/2020 HBO Risk Assessment Details Patient Name: [name], [name] Date of Service: 10/08/2020 8:30 AM Medical Record Number: [id number] Patient Account Number: [id number] Date of Birth/Sex: 1950-10-18 (70 y.o. Male) Treating RN: Tyler [name] Primary Care Tyler [name]: Tyler [name] Other Clinician: Referring Tyler [name]: Tyler [name] Treating Tyler [name]/Extender: Skipper Cliche in Treatment: 0 HBO Risk Assessment Items Answer Any "Yes" answers must be brought to the hyperbaric physician's attention. Breathing or Lung problemso No Currently use tobacco productso No Used tobacco products in the pasto No Heart problemso No Do you take water pills (diuretic)o Yes If yes, last time taken: this morning Diabeteso No Dialysiso No Eye problems like glaucomao No Ear problems or surgeryo No Sinus Problemso No Cancero No Confinement Anxiety (Claustrophobia- fear of confined places)o No Any medical implants/devices that are fully or partially implanted or attached to your bodyo No Pregnanto No Seizureso No Electronic Signature(s) Signed: 10/09/2020 9:53:32 AM By: [name], BSN, RN, CWS, Kim RN, BSN Entered By: [name], BSN, RN, CWS, Kim on 10/08/2020 08:58:25

## 2020-10-28 ENCOUNTER — Encounter: Payer: Medicare HMO | Admitting: Internal Medicine

## 2020-10-29 ENCOUNTER — Encounter: Payer: Medicare HMO | Admitting: Physician Assistant

## 2020-11-04 ENCOUNTER — Encounter: Payer: Medicare HMO | Attending: Internal Medicine | Admitting: Internal Medicine

## 2020-11-04 ENCOUNTER — Other Ambulatory Visit: Payer: Self-pay

## 2020-11-04 DIAGNOSIS — M272 Inflammatory conditions of jaws: Secondary | ICD-10-CM | POA: Insufficient documentation

## 2020-11-04 NOTE — Progress Notes (Signed)
[place], [place] ([id number]) Visit Report for 11/04/2020 Arrival Information Details Patient Name: Tyler [name], [name] Date of Service: 11/04/2020 8:00 AM Medical Record Number: [id number] Patient Account Number: [id number] Date of Birth/Sex: Dec 26, 1950 (70 y.o. M) Treating RN: Cornell Barman Primary Care Leonda Cristo: [name] Other Clinician: Jacqulyn Bath Referring Deaundra Kutzer: [name] Treating Carmie Lanpher/Extender: Tito Dine in Treatment: 3 Visit Information History Since Last Visit Added or deleted any medications: No Patient Arrived: Ambulatory Any new allergies or adverse reactions: No Arrival Time: 07:55 Had a fall or experienced change in No Accompanied By: self activities of daily living that may affect Transfer Assistance: None risk of falls: Patient Identification Verified: Yes Signs or symptoms of abuse/neglect since last visito No Secondary Verification Process Completed: Yes Hospitalized since last visit: No Implantable device outside of the clinic excluding No cellular tissue based products placed in the center since last visit: Pain Present Now: No Electronic Signature(s) Signed: 11/04/2020 4:15:54 PM By: Lorine Bears RCP, RRT, CHT Entered By: Lorine Bears on 11/04/2020 08:29:32 [place], [place]. ([id number]) -------------------------------------------------------------------------------- Encounter Discharge Information Details Patient Name: Renteria, [name] Date of Service: 11/04/2020 8:00 AM Medical Record Number: [id number] Patient Account Number: [id number] Date of Birth/Sex: 10-Aug-1951 (70 y.o. M) Treating RN: Cornell Barman Primary Care Eddye Broxterman: [name] Other Clinician: Jacqulyn Bath Referring Vayla Wilhelmi: [name] Treating Lorraina Spring/Extender: Tito Dine in Treatment: 3 Encounter Discharge Information Items Discharge Condition: Stable Ambulatory Status:  Ambulatory Discharge Destination: Home Transportation: Private Auto Accompanied By: self Schedule Follow-up Appointment: Yes Clinical Summary of Care: Notes Patient has an HBO treatment scheduled on 11/05/20 at 8:00 am. Electronic Signature(s) Signed: 11/04/2020 4:15:54 PM By: Lorine Bears RCP, RRT, CHT Entered By: Lorine Bears on 11/04/2020 10:51:38 [place], [name] ([id number]) -------------------------------------------------------------------------------- Vitals Details Patient Name: Strandberg, [name] Date of Service: 11/04/2020 8:00 AM Medical Record Number: [id number] Patient Account Number: [id number] Date of Birth/Sex: [birth year] (70 y.o. M) Treating RN: Cornell Barman Primary Care Zykeem Bauserman: [name] Other Clinician: Jacqulyn Bath Referring Reyn Faivre: [name] Treating Jill Stopka/Extender: Tito Dine in Treatment: 3 Vital Signs Time Taken: 08:24 Temperature (F): 98.2 Height (in): 72 Pulse (bpm): 66 Weight (lbs): 240 Respiratory Rate (breaths/min): 18 Body Mass Index (BMI): 32.5 Blood Pressure (mmHg): 142/82 Reference Range: 80 - 120 mg / dl Electronic Signature(s) Signed: 11/04/2020 4:15:54 PM By: Lorine Bears RCP, RRT, CHT Entered By: Lorine Bears on 11/04/2020 08:30:19

## 2020-11-05 ENCOUNTER — Encounter: Payer: Medicare HMO | Admitting: Physician Assistant

## 2020-11-05 DIAGNOSIS — M272 Inflammatory conditions of jaws: Secondary | ICD-10-CM | POA: Diagnosis not present

## 2020-11-05 NOTE — Progress Notes (Signed)
[place], [place] ([id number]) Visit Report for 11/04/2020 HBO Details Patient Name: Tyler [name], Tyler [name]. Date of Service: 11/04/2020 8:00 AM Medical Record Number: [id number] Patient Account Number: [id number] Date of Birth/Sex: 12-18-1950 (70 y.o. M) Treating RN: Cornell Barman Primary Care Halen Antenucci: [name] Other Clinician: Jacqulyn Bath Referring Elisavet Buehrer: [name] Treating Kahdijah Errickson/Extender: Tito Dine in Treatment: 3 HBO Treatment Course Details Treatment Course Number: 1 Ordering Kimala Horne: Jeri Cos Total Treatments Ordered: 30 HBO Treatment Start Date: 11/04/2020 HBO Indication: Osteoradionecrosis of Jaw HBO Treatment Details Treatment Number: 1 Patient Type: Outpatient Chamber Type: Monoplace Chamber Serial #: [id number] Treatment Protocol: 2.5 ATA with 90 minutes oxygen, with two 5 minute air breaks Treatment Details Compression Rate Down: 1.5 psi / minute De-Compression Rate Up: 2.0 psi / minute Compress Tx Pressure Air breaks and breathing periods Decompress Decompress Begins Reached (leave unused spaces blank) Begins Ends Chamber Pressure (ATA) 1 2.5 2.5 2.5 2.5 2.5 - - 2.5 1 Clock Time (24 hr) 08:23 08:38 09:08 09:14 09:44 09:49 - - 10:19 10:31 Treatment Length: 128 (minutes) Treatment Segments: 4 Vital Signs Capillary Blood Glucose Reference Range: 80 - 120 mg / dl HBO Diabetic Blood Glucose Intervention Range: <131 mg/dl or >249 mg/dl Time Vitals Blood Respiratory Capillary Blood Glucose Pulse Action Type: Pulse: Temperature: Taken: Pressure: Rate: Glucose (mg/dl): Meter #: Oximetry (%) Taken: Pre 08:22 142/82 66 18 98.2 Post 10:35 138/72 54 18 98.3 Treatment Response Treatment Toleration: Well Treatment Completion Treatment Completed without Adverse Event Status: Enez Monahan Notes Patient's first HBO treatment he tolerated very well. Physical exam prior showed normal respiratory and cardiac exams tympanic membranes were normal  although [name] bit difficult to see on the right probably related to prior radiation HBO Attestation I certify that I supervised this HBO treatment in accordance with Medicare guidelines. [name] trained emergency response team is readily Yes available per hospital policies and procedures. Continue HBOT as ordered. Yes Electronic Signature(s) Signed: 11/05/2020 12:09:37 PM By: Linton Ham MD Previous Signature: 11/04/2020 4:15:54 PM Version By: Lorine Bears RCP, RRT, CHT Entered By: Linton Ham on 11/04/2020 16:36:48 [place], [place] [name]. ([id number]) -------------------------------------------------------------------------------- HBO Safety Checklist Details Patient Name: Tyler [name]. Date of Service: 11/04/2020 8:00 AM Medical Record Number: [id number] Patient Account Number: [id number] Date of Birth/Sex: [birth year] (70 y.o. M) Treating RN: Cornell Barman Primary Care Hamilton Marinello: [name] Other Clinician: Jacqulyn Bath Referring Marki Frede: [name] Treating Tijuan Dantes/Extender: Tito Dine in Treatment: 3 HBO Safety Checklist Items Safety Checklist Consent Form Signed Patient voided / foley secured and emptied When did you last eato 11/03/20 pm Last dose of injectable or oral agent n/[name] Ostomy pouch emptied and vented if applicable NA All implantable devices assessed, documented and approved NA Intravenous access site secured and place NA Valuables secured Linens and cotton and cotton/polyester blend (less than 51% polyester) Personal oil-based products / skin lotions / body lotions removed Wigs or hairpieces removed NA Smoking or tobacco materials removed NA Books / newspapers / magazines / loose paper removed NA Cologne, aftershave, perfume and deodorant removed Jewelry removed (may wrap wedding band) Make-up removed NA Hair care products removed Battery operated devices (external) removed NA Heating patches and chemical warmers  removed NA Titanium eyewear removed NA Nail polish cured greater than 10 hours NA Casting material cured greater than 10 hours NA Hearing aids removed NA Loose dentures or partials removed NA Prosthetics have been removed NA Patient demonstrates correct use of air break device (if applicable) Patient concerns have been addressed  Patient grounding bracelet on and cord attached to chamber Specifics for Inpatients (complete in addition to above) Medication sheet sent with patient Intravenous medications needed or due during therapy sent with patient Drainage tubes (e.g. nasogastric tube or chest tube secured and vented) Endotracheal or Tracheotomy tube secured Cuff deflated of air and inflated with saline Airway suctioned Electronic Signature(s) Signed: 11/04/2020 4:15:54 PM By: Lorine Bears RCP, RRT, CHT Entered By: Lorine Bears on 11/04/2020 08:31:40

## 2020-11-05 NOTE — Progress Notes (Signed)
[place], [place] ([id number]) Visit Report for 11/05/2020 HBO Details Patient Name: [name], [name] Date of Service: 11/05/2020 8:00 AM Medical Record Number: [id number] Patient Account Number: [id number] Date of Birth/Sex: 08/27/1951 (70 y.o. M) Treating RN: Dolan Amen Primary Care Josanne Tyler [name] Other Clinician: Jacqulyn Bath Referring Johnita Tyler [name] Treating Khalani Novoa/Extender: Skipper Cliche in Treatment: 4 HBO Treatment Course Details Treatment Course Number: 1 Ordering Jahzaria Vary: Jeri Cos Total Treatments Ordered: 30 HBO Treatment Start Date: 11/04/2020 HBO Indication: Osteoradionecrosis of Jaw HBO Treatment Details Treatment Number: 2 Patient Type: Outpatient Chamber Type: Monoplace Chamber Serial #: [id number] Treatment Protocol: 2.5 ATA with 90 minutes oxygen, with two 5 minute air breaks Treatment Details Compression Rate Down: 1.5 psi / minute De-Compression Rate Up: 2.0 psi / minute Compress Tx Pressure Air breaks and breathing periods Decompress Decompress Begins Reached (leave unused spaces blank) Begins Ends Chamber Pressure (ATA) 1 2.5 2.5 2.5 2.5 2.5 - - 2.5 1 Clock Time (24 hr) 08:27 08:42 09:12 09:17 09:47 09:53 - - 10:23 10:34 Treatment Length: 127 (minutes) Treatment Segments: 4 Vital Signs Capillary Blood Glucose Reference Range: 80 - 120 mg / dl HBO Diabetic Blood Glucose Intervention Range: <131 mg/dl or >249 mg/dl Time Vitals Blood Respiratory Capillary Blood Glucose Pulse Action Type: Pulse: Temperature: Taken: Pressure: Rate: Glucose (mg/dl): Meter #: Oximetry (%) Taken: Pre 08:10 140/88 66 18 98.5 Post 10:39 144/82 60 18 98.1 Treatment Response Treatment Toleration: Well Treatment Completion Treatment Completed without Adverse Event Status: Electronic Signature(s) Signed: 11/05/2020 10:50:33 AM By: Paulla Fore, RRT, CHT Signed: 11/05/2020 4:58:35 PM By: Worthy Keeler PA-C Entered  By: Lorine Bears on 11/05/2020 10:48:59 [place], [place] A. ([id number]) -------------------------------------------------------------------------------- HBO Safety Checklist Details Patient Name: [name], [name] Date of Service: 11/05/2020 8:00 AM Medical Record Number: [id number] Patient Account Number: [id number] Date of Birth/Sex: 20-Jul-1951 (70 y.o. M) Treating RN: Dolan Amen Primary Care Amelia Macken: Wilhemina [name] Other Clinician: Jacqulyn Bath Referring Tyler [name] Treating Rene Sizelove/Extender: Skipper Cliche in Treatment: 4 HBO Safety Checklist Items Safety Checklist Consent Form Signed Patient voided / foley secured and emptied When did you last eato 11/05/20 Last dose of injectable or oral agent n/a Ostomy pouch emptied and vented if applicable NA All implantable devices assessed, documented and approved NA Intravenous access site secured and place NA Valuables secured Linens and cotton and cotton/polyester blend (less than 51% polyester) Personal oil-based products / skin lotions / body lotions removed Wigs or hairpieces removed NA Smoking or tobacco materials removed NA Books / newspapers / magazines / loose paper removed Cologne, aftershave, perfume and deodorant removed Jewelry removed (may wrap wedding band) Make-up removed NA Hair care products removed Battery operated devices (external) removed NA Heating patches and chemical warmers removed NA Titanium eyewear removed NA Nail polish cured greater than 10 hours NA Casting material cured greater than 10 hours NA Hearing aids removed NA Loose dentures or partials removed NA Prosthetics have been removed NA Patient demonstrates correct use of air break device (if applicable) Patient concerns have been addressed Patient grounding bracelet on and cord attached to chamber Specifics for Inpatients (complete in addition to above) Medication sheet sent with  patient Intravenous medications needed or due during therapy sent with patient Drainage tubes (e.g. nasogastric tube or chest tube secured and vented) Endotracheal or Tracheotomy tube secured Cuff deflated of air and inflated with saline Airway suctioned Electronic Signature(s) Signed: 11/05/2020 10:50:33 AM By: Becky Sax, Sallie RCP, RRT, CHT Entered By: Becky Sax,  Sallie on 11/05/2020 08:18:06

## 2020-11-05 NOTE — Progress Notes (Signed)
[place], [place] ([id number]) Visit Report for 11/05/2020 Arrival Information Details Patient Name: LUKAH, GOSWAMI A. Date of Service: 11/05/2020 8:00 AM Medical Record Number: [id number] Patient Account Number: [id number] Date of Birth/Sex: 13-Oct-1950 (70 y.o. M) Treating RN: Dolan Amen Primary Care Jaryd Drew: [name] Other Clinician: Jacqulyn Bath Referring Tykeem Lanzer: [name] Treating Jowanda Heeg/Extender: Skipper Cliche in Treatment: 4 Visit Information History Since Last Visit Added or deleted any medications: No Patient Arrived: Ambulatory Any new allergies or adverse reactions: No Arrival Time: 08:10 Had a fall or experienced change in No Accompanied By: self activities of daily living that may affect Transfer Assistance: None risk of falls: Patient Identification Verified: Yes Hospitalized since last visit: No Secondary Verification Process Completed: Yes Implantable device outside of the clinic excluding No cellular tissue based products placed in the center since last visit: Pain Present Now: No Electronic Signature(s) Signed: 11/05/2020 10:50:33 AM By: Lorine Bears RCP, RRT, CHT Entered By: Lorine Bears on 11/05/2020 08:14:05 [place], [place]. ([id number]) -------------------------------------------------------------------------------- Encounter Discharge Information Details Patient Name: Ramone, Brittany A. Date of Service: 11/05/2020 8:00 AM Medical Record Number: [id number] Patient Account Number: [id number] Date of Birth/Sex: 1951/03/03 (70 y.o. M) Treating RN: Dolan Amen Primary Care Jourdain Guay: [name] Other Clinician: Jacqulyn Bath Referring Natanya Holecek: [name] Treating Tanai Bouler/Extender: Skipper Cliche in Treatment: 4 Encounter Discharge Information Items Discharge Condition: Stable Ambulatory Status: Ambulatory Discharge Destination: Home Transportation: Private  Auto Accompanied By: self Schedule Follow-up Appointment: Yes Clinical Summary of Care: Notes Patient has an HBO treatment scheduled on 11/06/20 at 08:00 am. Electronic Signature(s) Signed: 11/05/2020 10:50:33 AM By: Lorine Bears RCP, RRT, CHT Entered By: Lorine Bears on 11/05/2020 10:50:09 [place], [place] A. ([id number]) -------------------------------------------------------------------------------- Vitals Details Patient Name: Desautel, Karandeep A. Date of Service: 11/05/2020 8:00 AM Medical Record Number: [id number] Patient Account Number: [id number] Date of Birth/Sex: April 23, 1951 (70 y.o. M) Treating RN: Dolan Amen Primary Care Angelmarie Ponzo: [name] Other Clinician: Jacqulyn Bath Referring Andrei Mccook: [name] Treating Rogers Ditter/Extender: Skipper Cliche in Treatment: 4 Vital Signs Time Taken: 08:10 Temperature (F): 98.5 Height (in): 72 Pulse (bpm): 66 Weight (lbs): 240 Respiratory Rate (breaths/min): 18 Body Mass Index (BMI): 32.5 Blood Pressure (mmHg): 140/88 Reference Range: 80 - 120 mg / dl Electronic Signature(s) Signed: 11/05/2020 10:50:33 AM By: Lorine Bears RCP, RRT, CHT Entered By: Lorine Bears on 11/05/2020 08:15:27

## 2020-11-06 ENCOUNTER — Other Ambulatory Visit: Payer: Self-pay

## 2020-11-06 ENCOUNTER — Encounter: Payer: Medicare HMO | Admitting: Physician Assistant

## 2020-11-06 DIAGNOSIS — M272 Inflammatory conditions of jaws: Secondary | ICD-10-CM | POA: Diagnosis not present

## 2020-11-06 NOTE — Progress Notes (Signed)
[place], [place] ([id number]) Visit Report for 11/06/2020 Arrival Information Details Patient Name: Tyler [name], [name] Date of Service: 11/06/2020 8:00 AM Medical Record Number: [id number] Patient Account Number: [id number] Date of Birth/Sex: [birth year] (70 y.o. M) Treating RN: Carlene Coria Primary Care Laquetta Racey: [name] Other Clinician: Jacqulyn Bath Referring Rachel Samples: [name] Treating Rosmary Dionisio/Extender: Skipper Cliche in Treatment: 4 Visit Information History Since Last Visit Added or deleted any medications: No Patient Arrived: Ambulatory Any new allergies or adverse reactions: No Arrival Time: 08:00 Had a fall or experienced change in No Accompanied By: self activities of daily living that may affect Transfer Assistance: None risk of falls: Patient Identification Verified: Yes Signs or symptoms of abuse/neglect since last visito No Secondary Verification Process Completed: Yes Hospitalized since last visit: No Implantable device outside of the clinic excluding No cellular tissue based products placed in the center since last visit: Pain Present Now: No Electronic Signature(s) Signed: 11/06/2020 4:27:13 PM By: Lorine Bears RCP, RRT, CHT Entered By: Lorine Bears on 11/06/2020 08:13:23 [place], [place]. ([id number]) -------------------------------------------------------------------------------- Encounter Discharge Information Details Patient Name: Tyler [name], [name] Date of Service: 11/06/2020 8:00 AM Medical Record Number: [id number] Patient Account Number: [id number] Date of Birth/Sex: 09-Jan-1951 (70 y.o. M) Treating RN: Carlene Coria Primary Care Natha Guin: [name] Other Clinician: Jacqulyn Bath Referring Hailey Stormer: [name] Treating Neftaly Swiss/Extender: Skipper Cliche in Treatment: 4 Encounter Discharge Information Items Discharge Condition: Stable Ambulatory Status: Ambulatory Discharge  Destination: Home Transportation: Private Auto Accompanied By: self Schedule Follow-up Appointment: Yes Clinical Summary of Care: Notes Patient has an HBO treatment scheduled on 11/09/20 at 08:00 am. Electronic Signature(s) Signed: 11/06/2020 4:27:13 PM By: Lorine Bears RCP, RRT, CHT Entered By: Lorine Bears on 11/06/2020 11:55:18 Tyler [name], [name] ([id number]) -------------------------------------------------------------------------------- Vitals Details Patient Name: Tyler [name], [name] Date of Service: 11/06/2020 8:00 AM Medical Record Number: [id number] Patient Account Number: [id number] Date of Birth/Sex: 06/04/1951 (70 y.o. M) Treating RN: Carlene Coria Primary Care Isobel Eisenhuth: [name] Other Clinician: Jacqulyn Bath Referring Will Heinkel: [name] Treating Alvena Kiernan/Extender: Skipper Cliche in Treatment: 4 Vital Signs Time Taken: 08:05 Temperature (F): 98.3 Height (in): 72 Pulse (bpm): 54 Weight (lbs): 240 Respiratory Rate (breaths/min): 18 Body Mass Index (BMI): 32.5 Blood Pressure (mmHg): 150/78 Reference Range: 80 - 120 mg / dl Electronic Signature(s) Signed: 11/06/2020 4:27:13 PM By: Lorine Bears RCP, RRT, CHT Entered By: Becky Sax, Amado Nash on 11/06/2020 08:21:46

## 2020-11-06 NOTE — Progress Notes (Signed)
Tyler [name] ([id number]) Visit Report for 11/06/2020 HBO Details Patient Name: Tyler [name], [name] Date of Service: 11/06/2020 8:00 AM Medical Record Number: [id number] Patient Account Number: [id number] Date of Birth/Sex: 1951/05/19 (70 y.o. M) Treating RN: Tyler [name] Primary Care Tyler [name]: Tyler [name] Other Clinician: Jacqulyn [name] Referring Tyler [name]: Tyler [name] Treating Tyler [name]/Extender: Tyler [name] in Treatment: 4 HBO Treatment Course Details Treatment Course Number: 1 Ordering Tyler [name]: Tyler [name] Total Treatments Ordered: 30 HBO Treatment Start Date: 11/04/2020 HBO Indication: Osteoradionecrosis of Jaw HBO Treatment Details Treatment Number: 3 Patient Type: Outpatient Chamber Type: Monoplace Chamber Serial #: [id number] Treatment Protocol: 2.5 ATA with 90 minutes oxygen, with two 5 minute air breaks Treatment Details Compression Rate Down: 1.5 psi / minute De-Compression Rate Up: 2.0 psi / minute Compress Tx Pressure Air breaks and breathing periods Decompress Decompress Begins Reached (leave unused spaces blank) Begins Ends Chamber Pressure (ATA) 1 2.5 2.5 2.5 2.5 2.5 - - 2.5 1 Clock Time (24 hr) 08:12 08:27 08:57 09:02 09:33 09:38 - - 10:08 10:19 Treatment Length: 127 (minutes) Treatment Segments: 4 Vital Signs Capillary Blood Glucose Reference Range: 80 - 120 mg / dl HBO Diabetic Blood Glucose Intervention Range: <131 mg/dl or >249 mg/dl Time Vitals Blood Respiratory Capillary Blood Glucose Pulse Action Type: Pulse: Temperature: Taken: Pressure: Rate: Glucose (mg/dl): Meter #: Oximetry (%) Taken: Pre 08:05 150/78 54 18 98.3 Post 10:25 150/76 60 18 98.3 Treatment Response Treatment Toleration: Well Treatment Completion Treatment Completed without Adverse Event Status: Electronic Signature(s) Signed: 11/06/2020 4:27:13 PM By: Tyler [name] Signed: 11/06/2020 4:32:10 PM By: Worthy Keeler PA-C Entered By:  Tyler [name] on 11/06/2020 11:54:22 [place], [name] ([id number]) -------------------------------------------------------------------------------- HBO Safety Checklist Details Patient Name: Tyler [name], [name] Date of Service: 11/06/2020 8:00 AM Medical Record Number: [id number] Patient Account Number: [id number] Date of Birth/Sex: 1950-12-19 (70 y.o. M) Treating RN: Tyler [name] Primary Care Karrina Lye: Tyler [name] Other Clinician: Jacqulyn [name] Referring Tyler [name]: Tyler [name] Treating Tyler [name]/Extender: Tyler [name] in Treatment: 4 HBO Safety Checklist Items Safety Checklist Consent Form Signed Patient voided / foley secured and emptied When did you last eato 11/05/20 pm Last dose of injectable or oral agent n/a Ostomy pouch emptied and vented if applicable NA All implantable devices assessed, documented and approved NA Intravenous access site secured and place NA Valuables secured Linens and cotton and cotton/polyester blend (less than 51% polyester) Personal oil-based products / skin lotions / body lotions removed Wigs or hairpieces removed NA Smoking or tobacco materials removed NA Books / newspapers / magazines / loose paper removed Cologne, aftershave, perfume and deodorant removed Jewelry removed (may wrap wedding band) Make-up removed NA Hair care products removed Battery operated devices (external) removed NA Heating patches and chemical warmers removed NA Titanium eyewear removed NA Nail polish cured greater than 10 hours NA Casting material cured greater than 10 hours NA Hearing aids removed NA Loose dentures or partials removed NA Prosthetics have been removed NA Patient demonstrates correct use of air break device (if applicable) Patient concerns have been addressed Patient grounding bracelet on and cord attached to chamber Specifics for Inpatients (complete in addition to above) Medication sheet sent with  patient Intravenous medications needed or due during therapy sent with patient Drainage tubes (e.g. nasogastric tube or chest tube secured and vented) Endotracheal or Tracheotomy tube secured Cuff deflated of air and inflated with saline Airway suctioned Electronic Signature(s) Signed: 11/06/2020 4:27:13 PM By: Tyler [name], Tyler [name] Entered By: Tyler [name],  Tyler [name] on 11/06/2020 08:23:03

## 2020-11-09 ENCOUNTER — Other Ambulatory Visit: Payer: Self-pay

## 2020-11-09 ENCOUNTER — Encounter: Payer: Medicare HMO | Admitting: Physician Assistant

## 2020-11-09 DIAGNOSIS — M272 Inflammatory conditions of jaws: Secondary | ICD-10-CM | POA: Diagnosis not present

## 2020-11-09 NOTE — Progress Notes (Signed)
[place], [place] ([id number]) Visit Report for 11/09/2020 Arrival Information Details Patient Name: Tyler [name], [name] Date of Service: 11/09/2020 8:00 AM Medical Record Number: [id number] Patient Account Number: [id number] Date of Birth/Sex: 07/09/1951 (70 y.o. M) Treating RN: Carlene Coria Primary Care Talullah Abate: [name] Other Clinician: Jacqulyn Bath Referring [place]: [name] Treating Ismar Yabut/Extender: Skipper Cliche in Treatment: 4 Visit Information History Since Last Visit Added or deleted any medications: No Patient Arrived: Ambulatory Any new allergies or adverse reactions: No Arrival Time: 08:00 Had a fall or experienced change in No Accompanied By: self activities of daily living that may affect Transfer Assistance: None risk of falls: Patient Identification Verified: Yes Signs or symptoms of abuse/neglect since last visito No Secondary Verification Process Completed: Yes Hospitalized since last visit: No Implantable device outside of the clinic excluding No cellular tissue based products placed in the center since last visit: Pain Present Now: No Electronic Signature(s) Signed: 11/09/2020 4:30:05 PM By: Lorine Bears RCP, RRT, CHT Entered By: Lorine Bears on 11/09/2020 08:38:24 Tyler [name], [name] ([id number]) -------------------------------------------------------------------------------- Encounter Discharge Information Details Patient Name: Tyler [name], [name] Date of Service: 11/09/2020 8:00 AM Medical Record Number: [id number] Patient Account Number: [id number] Date of Birth/Sex: 09/24/1950 (70 y.o. M) Treating RN: Carlene Coria Primary Care Andrell Tallman: [name] Other Clinician: Jacqulyn Bath Referring Fujie Dickison: [name] Treating Ferman Basilio/Extender: Skipper Cliche in Treatment: 4 Encounter Discharge Information Items Discharge Condition: Stable Ambulatory Status: Ambulatory Discharge  Destination: Home Transportation: Private Auto Accompanied By: self Schedule Follow-up Appointment: Yes Clinical Summary of Care: Notes Patient has an HBO treatment scheduled on 11/10/20 at 08:00 am. Electronic Signature(s) Signed: 11/09/2020 4:30:05 PM By: Lorine Bears RCP, RRT, CHT Entered By: Lorine Bears on 11/09/2020 10:54:50 Tyler [name], [name] ([id number]) -------------------------------------------------------------------------------- Vitals Details Patient Name: Tyler [name], [name] Date of Service: 11/09/2020 8:00 AM Medical Record Number: [id number] Patient Account Number: [id number] Date of Birth/Sex: 1950-10-28 (70 y.o. M) Treating RN: Carlene Coria Primary Care Genova Kiner: [name] Other Clinician: Jacqulyn Bath Referring Yesha Muchow: [name] Treating Edithe Dobbin/Extender: Skipper Cliche in Treatment: 4 Vital Signs Time Taken: 08:05 Temperature (F): 98.3 Height (in): 72 Pulse (bpm): 66 Weight (lbs): 240 Respiratory Rate (breaths/min): 18 Body Mass Index (BMI): 32.5 Blood Pressure (mmHg): 150/90 Reference Range: 80 - 120 mg / dl Electronic Signature(s) Signed: 11/09/2020 4:30:05 PM By: Lorine Bears RCP, RRT, CHT Entered By: Lorine Bears on 11/09/2020 08:39:31

## 2020-11-09 NOTE — Progress Notes (Signed)
[place], [place] ([id number]) Visit Report for 11/09/2020 HBO Details Patient Name: Tyler [name], Tyler [name]. Date of Service: 11/09/2020 8:00 AM Medical Record Number: [id number] Patient Account Number: [id number] Date of Birth/Sex: [birth year] (70 y.o. M) Treating RN: Carlene Coria Primary Care Gissella Niblack: [name] Other Clinician: Jacqulyn Bath Referring Juanluis Guastella: [name] Treating Lucielle Vokes/Extender: Skipper Cliche in Treatment: 4 HBO Treatment Course Details Treatment Course Number: 1 Ordering Kaiyu Mirabal: Jeri Cos Total Treatments Ordered: 30 HBO Treatment Start Date: 11/04/2020 HBO Indication: Osteoradionecrosis of Jaw HBO Treatment Details Treatment Number: 4 Patient Type: Outpatient Chamber Type: Monoplace Chamber Serial #: [id number] Treatment Protocol: 2.5 ATA with 90 minutes oxygen, with two 5 minute air breaks Treatment Details Compression Rate Down: 1.5 psi / minute De-Compression Rate Up: 2.0 psi / minute Compress Tx Pressure Air breaks and breathing periods Decompress Decompress Begins Reached (leave unused spaces blank) Begins Ends Chamber Pressure (ATA) 1 2.5 2.5 2.5 2.5 2.5 - - 2.5 1 Clock Time (24 hr) 08:15 08:30 09:00 09:05 09:36 09:41 - - 10:12 10:23 Treatment Length: 128 (minutes) Treatment Segments: 4 Vital Signs Capillary Blood Glucose Reference Range: 80 - 120 mg / dl HBO Diabetic Blood Glucose Intervention Range: <131 mg/dl or >249 mg/dl Time Vitals Blood Respiratory Capillary Blood Glucose Pulse Action Type: Pulse: Temperature: Taken: Pressure: Rate: Glucose (mg/dl): Meter #: Oximetry (%) Taken: Pre 08:05 150/90 66 18 98.3 Post 10:30 136/92 60 18 98.1 Treatment Response Treatment Toleration: Well Treatment Completion Treatment Completed without Adverse Event Status: Electronic Signature(s) Signed: 11/09/2020 4:30:05 PM By: Paulla Fore, RRT, CHT Signed: 11/09/2020 5:46:59 PM By: Worthy Keeler PA-C Entered By:  Lorine Bears on 11/09/2020 10:53:57 [place], [place] [name]. ([id number]) -------------------------------------------------------------------------------- HBO Safety Checklist Details Patient Name: Dunlop, Tyler [name]. Date of Service: 11/09/2020 8:00 AM Medical Record Number: [id number] Patient Account Number: [id number] Date of Birth/Sex: August 24, 1951 (70 y.o. M) Treating RN: Carlene Coria Primary Care Nikiyah Fackler: [name] Other Clinician: Jacqulyn Bath Referring Tyheim Vanalstyne: [name] Treating Nickola Lenig/Extender: Skipper Cliche in Treatment: 4 HBO Safety Checklist Items Safety Checklist Consent Form Signed Patient voided / foley secured and emptied When did you last eato 11/08/20 pm Last dose of injectable or oral agent n/[name] Ostomy pouch emptied and vented if applicable NA All implantable devices assessed, documented and approved NA Intravenous access site secured and place NA Valuables secured Linens and cotton and cotton/polyester blend (less than 51% polyester) Personal oil-based products / skin lotions / body lotions removed Wigs or hairpieces removed NA Smoking or tobacco materials removed NA Books / newspapers / magazines / loose paper removed Cologne, aftershave, perfume and deodorant removed Jewelry removed (may wrap wedding band) Make-up removed NA Hair care products removed Battery operated devices (external) removed NA Heating patches and chemical warmers removed NA Titanium eyewear removed NA Nail polish cured greater than 10 hours NA Casting material cured greater than 10 hours NA Hearing aids removed NA Loose dentures or partials removed NA Prosthetics have been removed NA Patient demonstrates correct use of air break device (if applicable) Patient concerns have been addressed Patient grounding bracelet on and cord attached to chamber Specifics for Inpatients (complete in addition to above) Medication sheet sent with  patient Intravenous medications needed or due during therapy sent with patient Drainage tubes (e.g. nasogastric tube or chest tube secured and vented) Endotracheal or Tracheotomy tube secured Cuff deflated of air and inflated with saline Airway suctioned Electronic Signature(s) Signed: 11/09/2020 4:30:05 PM By: Becky Sax, Sallie RCP, RRT, CHT Entered By: Juleen China,  RCP,RRT,CHT, Sallie on 11/09/2020 08:42:08

## 2020-11-10 ENCOUNTER — Encounter: Payer: Medicare HMO | Admitting: Physician Assistant

## 2020-11-10 DIAGNOSIS — M272 Inflammatory conditions of jaws: Secondary | ICD-10-CM | POA: Diagnosis not present

## 2020-11-10 NOTE — Progress Notes (Signed)
[place], [place] ([id number]) Visit Report for 11/10/2020 Arrival Information Details Patient Name: Tyler [name], [name] Date of Service: 11/10/2020 8:00 AM Medical Record Number: [id number] Patient Account Number: [id number] Date of Birth/Sex: 1951/05/31 (70 y.o. M) Treating RN: Dolan Amen Primary Care Lillyan Hitson: [name] Other Clinician: Jacqulyn Bath Referring Jabarie Pop: [name] Treating Skylor Hughson/Extender: Tyler [name] in Treatment: 4 Visit Information History Since Last Visit Added or deleted any medications: No Patient Arrived: Ambulatory Any new allergies or adverse reactions: No Arrival Time: 08:27 Had a fall or experienced change in No Accompanied By: self activities of daily living that may affect Transfer Assistance: None risk of falls: Patient Identification Verified: Yes Signs or symptoms of abuse/neglect since last visito No Secondary Verification Process Completed: Yes Hospitalized since last visit: No Implantable device outside of the clinic excluding No cellular tissue based products placed in the center since last visit: Pain Present Now: No Electronic Signature(s) Signed: 11/10/2020 10:52:34 AM By: Lorine Bears RCP, RRT, CHT Entered By: Lorine Bears on 11/10/2020 08:28:36 [name], [name] ([id number]) -------------------------------------------------------------------------------- Encounter Discharge Information Details Patient Name: [name], [name] Date of Service: 11/10/2020 8:00 AM Medical Record Number: [id number] Patient Account Number: [id number] Date of Birth/Sex: [birth year] (70 y.o. M) Treating RN: Dolan Amen Primary Care Bunny Lowdermilk: [name] Other Clinician: Jacqulyn Bath Referring Cedar Roseman: [name] Treating Sita Mangen/Extender: Tyler [name] in Treatment: 4 Encounter Discharge Information Items Discharge Condition: Stable Ambulatory Status: Ambulatory Discharge  Destination: Home Transportation: Private Auto Accompanied By: self Schedule Follow-up Appointment: Yes Clinical Summary of Care: Notes Patient has an HBO treatment scheduled on 11/11/20 at 08:00 am. Electronic Signature(s) Signed: 11/10/2020 10:52:34 AM By: Lorine Bears RCP, RRT, CHT Entered By: Lorine Bears on 11/10/2020 10:52:15 [name], [name] ([id number]) -------------------------------------------------------------------------------- Vitals Details Patient Name: [name], [name] Date of Service: 11/10/2020 8:00 AM Medical Record Number: [id number] Patient Account Number: [id number] Date of Birth/Sex: [birth year] (70 y.o. M) Treating RN: Dolan Amen Primary Care Anie Juniel: [name] Other Clinician: Jacqulyn Bath Referring Shalaine Payson: [name] Treating Tyler [name]/Extender: Tyler [name] in Treatment: 4 Vital Signs Time Taken: 08:00 Temperature (F): 98.3 Height (in): 72 Pulse (bpm): 60 Weight (lbs): 240 Respiratory Rate (breaths/min): 18 Body Mass Index (BMI): 32.5 Blood Pressure (mmHg): 146/82 Reference Range: 80 - 120 mg / dl Electronic Signature(s) Signed: 11/10/2020 10:52:34 AM By: Lorine Bears RCP, RRT, CHT Entered By: Lorine Bears on 11/10/2020 08:29:45

## 2020-11-11 ENCOUNTER — Encounter: Payer: Medicare HMO | Admitting: Internal Medicine

## 2020-11-11 ENCOUNTER — Other Ambulatory Visit: Payer: Self-pay

## 2020-11-11 DIAGNOSIS — M272 Inflammatory conditions of jaws: Secondary | ICD-10-CM | POA: Diagnosis not present

## 2020-11-11 NOTE — Progress Notes (Signed)
[place], [place] ([id number]) Visit Report for 11/11/2020 Arrival Information Details Patient Name: Tyler [name], [name] Date of Service: 11/11/2020 8:00 AM Medical Record Number: [id number] Patient Account Number: [id number] Date of Birth/Sex: 1951-08-25 (70 y.o. M) Treating RN: Cornell Barman Primary Care Provider: [name] Other Clinician: Jacqulyn Bath Referring Provider: [name] Treating Provider/Extender: Tito Dine in Treatment: 4 Visit Information History Since Last Visit Added or deleted any medications: No Patient Arrived: Ambulatory Any new allergies or adverse reactions: No Arrival Time: 08:11 Had a fall or experienced change in No Accompanied By: self activities of daily living that may affect Transfer Assistance: None risk of falls: Patient Identification Verified: Yes Signs or symptoms of abuse/neglect since last visito No Secondary Verification Process Completed: Yes Hospitalized since last visit: No Implantable device outside of the clinic excluding No cellular tissue based products placed in the center since last visit: Pain Present Now: No Electronic Signature(s) Signed: 11/11/2020 11:29:36 AM By: Lorine Bears RCP, RRT, CHT Entered By: Lorine Bears on 11/11/2020 08:11:35 [place], [place]. ([id number]) -------------------------------------------------------------------------------- Encounter Discharge Information Details Patient Name: [name], [name] Date of Service: 11/11/2020 8:00 AM Medical Record Number: [id number] Patient Account Number: [id number] Date of Birth/Sex: 24-Jul-1951 (70 y.o. M) Treating RN: Cornell Barman Primary Care Provider: [name] Other Clinician: Jacqulyn Bath Referring Provider: [name] Treating Provider/Extender: Tito Dine in Treatment: 4 Encounter Discharge Information Items Discharge Condition: Stable Ambulatory Status:  Ambulatory Discharge Destination: Home Transportation: Private Auto Accompanied By: self Schedule Follow-up Appointment: Yes Clinical Summary of Care: Notes Patient has an HBO treatment scheduled on 11/12/20 at 08:00 am. Electronic Signature(s) Signed: 11/11/2020 11:29:36 AM By: Lorine Bears RCP, RRT, CHT Entered By: Lorine Bears on 11/11/2020 11:24:42 [place], [place] A. ([id number]) -------------------------------------------------------------------------------- Vitals Details Patient Name: [name], [name] Date of Service: 11/11/2020 8:00 AM Medical Record Number: [id number] Patient Account Number: [id number] Date of Birth/Sex: 08/25/1951 (70 y.o. M) Treating RN: Cornell Barman Primary Care Provider: [name] Other Clinician: Jacqulyn Bath Referring Provider: [name] Treating Provider/Extender: Tito Dine in Treatment: 4 Vital Signs Time Taken: 08:05 Temperature (F): 98.1 Height (in): 72 Pulse (bpm): 60 Weight (lbs): 240 Respiratory Rate (breaths/min): 18 Body Mass Index (BMI): 32.5 Blood Pressure (mmHg): 150/84 Reference Range: 80 - 120 mg / dl Electronic Signature(s) Signed: 11/11/2020 11:29:36 AM By: Lorine Bears RCP, RRT, CHT Entered By: Lorine Bears on 11/11/2020 08:14:36

## 2020-11-12 ENCOUNTER — Encounter: Payer: Medicare HMO | Admitting: Physician Assistant

## 2020-11-12 DIAGNOSIS — M272 Inflammatory conditions of jaws: Secondary | ICD-10-CM | POA: Diagnosis not present

## 2020-11-12 NOTE — Progress Notes (Signed)
[place], [place] ([id number]) Visit Report for 11/11/2020 HBO Details Patient Name: Tyler [name], [name] Date of Service: 11/11/2020 8:00 AM Medical Record Number: [id number] Patient Account Number: [id number] Date of Birth/Sex: Oct 21, 1950 (70 y.o. M) Treating RN: Cornell Barman Primary Care Provider: [name] Other Clinician: Jacqulyn Bath Referring Provider: [name] Treating Provider/Extender: Tito Dine in Treatment: 4 HBO Treatment Course Details Treatment Course Number: 1 Ordering Provider: Jeri Cos Total Treatments Ordered: 30 HBO Treatment Start Date: 11/04/2020 HBO Indication: Osteoradionecrosis of Jaw HBO Treatment Details Treatment Number: 6 Patient Type: Outpatient Chamber Type: Monoplace Chamber Serial #: [id number] Treatment Protocol: 2.5 ATA with 90 minutes oxygen, with two 5 minute air breaks Treatment Details Compression Rate Down: 1.5 psi / minute De-Compression Rate Up: 2.0 psi / minute Compress Tx Pressure Air breaks and breathing periods Decompress Decompress Begins Reached (leave unused spaces blank) Begins Ends Chamber Pressure (ATA) 1 2.5 2.5 2.5 2.5 2.5 - - 2.5 1 Clock Time (24 hr) 08:10 08:25 08:55 09:00 09:31 09:36 - - 10:06 10:18 Treatment Length: 128 (minutes) Treatment Segments: 4 Vital Signs Capillary Blood Glucose Reference Range: 80 - 120 mg / dl HBO Diabetic Blood Glucose Intervention Range: <131 mg/dl or >249 mg/dl Time Vitals Blood Respiratory Capillary Blood Glucose Pulse Action Type: Pulse: Temperature: Taken: Pressure: Rate: Glucose (mg/dl): Meter #: Oximetry (%) Taken: Pre 08:05 150/84 60 18 98.1 Post 10:20 140/84 60 18 98 Treatment Response Treatment Toleration: Well Treatment Completion Treatment Completed without Adverse Event Status: Electronic Signature(s) Signed: 11/11/2020 11:29:36 AM By: Paulla Fore, RRT, CHT Signed: 11/12/2020 12:48:36 PM By: Linton Ham MD Entered By:  Lorine Bears on 11/11/2020 11:23:51 [place], [place] A. ([id number]) -------------------------------------------------------------------------------- HBO Safety Checklist Details Patient Name: Tyler [name], [name] Date of Service: 11/11/2020 8:00 AM Medical Record Number: [id number] Patient Account Number: [id number] Date of Birth/Sex: March 01, 1951 (70 y.o. M) Treating RN: Cornell Barman Primary Care Provider: [name] Other Clinician: Jacqulyn Bath Referring Provider: [name] Treating Provider/Extender: Tito Dine in Treatment: 4 HBO Safety Checklist Items Safety Checklist Consent Form Signed Patient voided / foley secured and emptied When did you last eato 11/10/20 pm Last dose of injectable or oral agent n/a Ostomy pouch emptied and vented if applicable NA All implantable devices assessed, documented and approved NA Intravenous access site secured and place NA Valuables secured Linens and cotton and cotton/polyester blend (less than 51% polyester) Personal oil-based products / skin lotions / body lotions removed Wigs or hairpieces removed NA Smoking or tobacco materials removed NA Books / newspapers / magazines / loose paper removed NA Cologne, aftershave, perfume and deodorant removed Jewelry removed (may wrap wedding band) Make-up removed NA Hair care products removed Battery operated devices (external) removed NA Heating patches and chemical warmers removed NA Titanium eyewear removed NA Nail polish cured greater than 10 hours NA Casting material cured greater than 10 hours NA Hearing aids removed NA Loose dentures or partials removed NA Prosthetics have been removed NA Patient demonstrates correct use of air break device (if applicable) Patient concerns have been addressed Patient grounding bracelet on and cord attached to chamber Specifics for Inpatients (complete in addition to above) Medication sheet sent with  patient Intravenous medications needed or due during therapy sent with patient Drainage tubes (e.g. nasogastric tube or chest tube secured and vented) Endotracheal or Tracheotomy tube secured Cuff deflated of air and inflated with saline Airway suctioned Electronic Signature(s) Signed: 11/11/2020 11:29:36 AM By: Becky Sax, Sallie RCP, RRT, CHT Entered  By: Lorine Bears on 11/11/2020 08:17:08

## 2020-11-12 NOTE — Progress Notes (Signed)
Tyler [name] ([id number]) Visit Report for 11/12/2020 HBO Details Patient Name: Tyler [name], [name] Date of Service: 11/12/2020 8:00 AM Medical Record Number: [id number] Patient Account Number: [id number] Date of Birth/Sex: [birth year] (70 y.o. M) Treating RN: Dolan Amen Primary Care Provider: [name] Other Clinician: Jacqulyn Bath Referring Provider: [name] Treating Provider/Extender: Skipper Cliche in Treatment: 5 HBO Treatment Course Details Treatment Course Number: 1 Ordering Provider: Jeri Cos Total Treatments Ordered: 30 HBO Treatment Start Date: 11/04/2020 HBO Indication: Osteoradionecrosis of Jaw HBO Treatment Details Treatment Number: 7 Patient Type: Outpatient Chamber Type: Monoplace Chamber Serial #: [id number] Treatment Protocol: 2.5 ATA with 90 minutes oxygen, with two 5 minute air breaks Treatment Details Compression Rate Down: 1.5 psi / minute De-Compression Rate Up: 2.0 psi / minute Compress Tx Pressure Air breaks and breathing periods Decompress Decompress Begins Reached (leave unused spaces blank) Begins Ends Chamber Pressure (ATA) 1 2.5 2.5 2.5 2.5 2.5 - - 2.5 1 Clock Time (24 hr) 09:09 09:24 09:54 09:59 10:30 10:35 - - 11:05 11:17 Treatment Length: 128 (minutes) Treatment Segments: 4 Vital Signs Capillary Blood Glucose Reference Range: 80 - 120 mg / dl HBO Diabetic Blood Glucose Intervention Range: <131 mg/dl or >249 mg/dl Time Vitals Blood Respiratory Capillary Blood Glucose Pulse Action Type: Pulse: Temperature: Taken: Pressure: Rate: Glucose (mg/dl): Meter #: Oximetry (%) Taken: Pre 09:05 158/82 72 18 98.2 Post 11:20 153/54 60 18 98 Treatment Response Treatment Toleration: Well Treatment Completion Treatment Completed without Adverse Event Status: Electronic Signature(s) Signed: 11/12/2020 4:15:03 PM By: Paulla Fore, RRT, CHT Signed: 11/12/2020 5:47:45 PM By: Worthy Keeler PA-C Entered By:  Lorine Bears on 11/12/2020 11:36:31 [name], [name] ([id number]) -------------------------------------------------------------------------------- HBO Safety Checklist Details Patient Name: Tyler [name], [name] Date of Service: 11/12/2020 8:00 AM Medical Record Number: [id number] Patient Account Number: [id number] Date of Birth/Sex: 01-05-1951 (70 y.o. M) Treating RN: Dolan Amen Primary Care Provider: [name] Other Clinician: Jacqulyn Bath Referring Provider: [name] Treating Provider/Extender: Skipper Cliche in Treatment: 5 HBO Safety Checklist Items Safety Checklist Consent Form Signed Patient voided / foley secured and emptied When did you last eato 11/11/20 pm Last dose of injectable or oral agent n/a Ostomy pouch emptied and vented if applicable NA All implantable devices assessed, documented and approved NA Intravenous access site secured and place NA Valuables secured Linens and cotton and cotton/polyester blend (less than 51% polyester) Personal oil-based products / skin lotions / body lotions removed Wigs or hairpieces removed NA Smoking or tobacco materials removed NA Books / newspapers / magazines / loose paper removed NA Cologne, aftershave, perfume and deodorant removed Jewelry removed (may wrap wedding band) Make-up removed NA Hair care products removed Battery operated devices (external) removed NA Heating patches and chemical warmers removed NA Titanium eyewear removed NA Nail polish cured greater than 10 hours NA Casting material cured greater than 10 hours NA Hearing aids removed NA Loose dentures or partials removed NA Prosthetics have been removed NA Patient demonstrates correct use of air break device (if applicable) Patient concerns have been addressed Patient grounding bracelet on and cord attached to chamber Specifics for Inpatients (complete in addition to above) Medication sheet sent with  patient Intravenous medications needed or due during therapy sent with patient Drainage tubes (e.g. nasogastric tube or chest tube secured and vented) Endotracheal or Tracheotomy tube secured Cuff deflated of air and inflated with saline Airway suctioned Electronic Signature(s) Signed: 11/12/2020 4:15:03 PM By: Becky Sax, Sallie RCP, RRT, CHT Entered By:  Becky Sax, Sallie on 11/12/2020 09:31:28

## 2020-11-12 NOTE — Progress Notes (Signed)
[place], [place] ([id number]) Visit Report for 11/10/2020 HBO Details Patient Name: Tyler [name], [name] Date of Service: 11/10/2020 8:00 AM Medical Record Number: [id number] Patient Account Number: [id number] Date of Birth/Sex: September 18, 1951 (70 y.o. M) Treating RN: Dolan Amen Primary Care Provider: [name] Other Clinician: Jacqulyn Bath Referring Provider: [name] Treating Provider/Extender: Skipper Cliche in Treatment: 4 HBO Treatment Course Details Treatment Course Number: 1 Ordering Provider: Jeri Cos Total Treatments Ordered: 30 HBO Treatment Start Date: 11/04/2020 HBO Indication: Osteoradionecrosis of Jaw HBO Treatment Details Treatment Number: 5 Patient Type: Outpatient Chamber Type: Monoplace Chamber Serial #: [id number] Treatment Protocol: 2.5 ATA with 90 minutes oxygen, with two 5 minute air breaks Treatment Details Compression Rate Down: 1.5 psi / minute De-Compression Rate Up: 2.0 psi / minute Compress Tx Pressure Air breaks and breathing periods Decompress Decompress Begins Reached (leave unused spaces blank) Begins Ends Chamber Pressure (ATA) 1 2.5 2.5 2.5 2.5 2.5 - - 2.5 1 Clock Time (24 hr) 08:23 08:38 09:09 09:14 09:44 09:50 - - 10:20 10:32 Treatment Length: 129 (minutes) Treatment Segments: 4 Vital Signs Capillary Blood Glucose Reference Range: 80 - 120 mg / dl HBO Diabetic Blood Glucose Intervention Range: <131 mg/dl or >249 mg/dl Time Vitals Blood Respiratory Capillary Blood Glucose Pulse Action Type: Pulse: Temperature: Taken: Pressure: Rate: Glucose (mg/dl): Meter #: Oximetry (%) Taken: Pre 08:00 146/82 60 18 98.3 Post 10:35 158/86 60 18 98.4 Treatment Response Treatment Toleration: Well Treatment Completion Treatment Completed without Adverse Event Status: Electronic Signature(s) Signed: 11/10/2020 10:52:34 AM By: Paulla Fore, RRT, CHT Signed: 11/12/2020 5:47:45 PM By: Worthy Keeler PA-C Entered  By: Lorine Bears on 11/10/2020 10:50:53 Tyler [name], [name] ([id number]) -------------------------------------------------------------------------------- HBO Safety Checklist Details Patient Name: Tyler [name], [name] Date of Service: 11/10/2020 8:00 AM Medical Record Number: [id number] Patient Account Number: [id number] Date of Birth/Sex: Jan 29, 1951 (70 y.o. M) Treating RN: Dolan Amen Primary Care Provider: [name] Other Clinician: Jacqulyn Bath Referring Provider: [name] Treating Provider/Extender: Skipper Cliche in Treatment: 4 HBO Safety Checklist Items Safety Checklist Consent Form Signed Patient voided / foley secured and emptied When did you last eato 11/09/20 pm Last dose of injectable or oral agent n/a Ostomy pouch emptied and vented if applicable NA All implantable devices assessed, documented and approved NA Intravenous access site secured and place NA Valuables secured Linens and cotton and cotton/polyester blend (less than 51% polyester) Personal oil-based products / skin lotions / body lotions removed Wigs or hairpieces removed NA Smoking or tobacco materials removed NA Books / newspapers / magazines / loose paper removed NA Cologne, aftershave, perfume and deodorant removed Jewelry removed (may wrap wedding band) Make-up removed NA Hair care products removed Battery operated devices (external) removed NA Heating patches and chemical warmers removed NA Titanium eyewear removed NA Nail polish cured greater than 10 hours NA Casting material cured greater than 10 hours NA Hearing aids removed NA Loose dentures or partials removed NA Prosthetics have been removed NA Patient demonstrates correct use of air break device (if applicable) Patient concerns have been addressed Patient grounding bracelet on and cord attached to chamber Specifics for Inpatients (complete in addition to above) Medication sheet sent  with patient Intravenous medications needed or due during therapy sent with patient Drainage tubes (e.g. nasogastric tube or chest tube secured and vented) Endotracheal or Tracheotomy tube secured Cuff deflated of air and inflated with saline Airway suctioned Electronic Signature(s) Signed: 11/10/2020 10:52:34 AM By: Becky Sax, Sallie RCP, RRT, CHT Entered By:  Becky Sax, Amado Nash on 11/10/2020 08:31:30

## 2020-11-12 NOTE — Progress Notes (Signed)
[place], [place] ([id number]) Visit Report for 11/12/2020 Arrival Information Details Patient Name: Tyler [name], [name] Date of Service: 11/12/2020 8:00 AM Medical Record Number: [id number] Patient Account Number: [id number] Date of Birth/Sex: Dec 22, 1950 (70 y.o. M) Treating RN: Dolan Amen Primary Care [name]: [name] Other Clinician: Jacqulyn Bath Referring Jashon Ishida: [name] Treating Cabell Lazenby/Extender: Skipper Cliche in Treatment: 5 Visit Information History Since Last Visit Added or deleted any medications: No Patient Arrived: Ambulatory Any new allergies or adverse reactions: No Arrival Time: 08:55 Had a fall or experienced change in No Accompanied By: self activities of daily living that may affect Transfer Assistance: None risk of falls: Patient Identification Verified: Yes Signs or symptoms of abuse/neglect since last visito No Secondary Verification Process Completed: Yes Hospitalized since last visit: No Implantable device outside of the clinic excluding No cellular tissue based products placed in the center since last visit: Pain Present Now: No Electronic Signature(s) Signed: 11/12/2020 4:15:03 PM By: Lorine Bears RCP, RRT, CHT Entered By: Lorine Bears on 11/12/2020 09:28:38 [place], [place]. ([id number]) -------------------------------------------------------------------------------- Encounter Discharge Information Details Patient Name: Tyler [name], [name] Date of Service: 11/12/2020 8:00 AM Medical Record Number: [id number] Patient Account Number: [id number] Date of Birth/Sex: [birth year] (70 y.o. M) Treating RN: Dolan Amen Primary Care Noheli Melder: [name] Other Clinician: Jacqulyn Bath Referring Zakiyyah Savannah: [name] Treating Malani Lees/Extender: Skipper Cliche in Treatment: 5 Encounter Discharge Information Items Discharge Condition: Stable Ambulatory Status: Ambulatory Discharge  Destination: Home Transportation: Private Auto Accompanied By: self Schedule Follow-up Appointment: Yes Clinical Summary of Care: Notes Patient has an HBO treatment scheduled on 11/13/20 at 08:00 am. Electronic Signature(s) Signed: 11/12/2020 4:15:03 PM By: Lorine Bears RCP, RRT, CHT Entered By: Lorine Bears on 11/12/2020 11:37:33 [place], [place]. ([id number]) -------------------------------------------------------------------------------- Vitals Details Patient Name: Tyler [name], [name] Date of Service: 11/12/2020 8:00 AM Medical Record Number: [id number] Patient Account Number: [id number] Date of Birth/Sex: 13-Jan-1951 (70 y.o. M) Treating RN: Dolan Amen Primary Care Berenise Hunton: [name] Other Clinician: Jacqulyn Bath Referring Fairy Ashlock: [name] Treating Christ Fullenwider/Extender: Skipper Cliche in Treatment: 5 Vital Signs Time Taken: 09:05 Temperature (F): 98.2 Height (in): 72 Pulse (bpm): 72 Weight (lbs): 240 Respiratory Rate (breaths/min): 18 Body Mass Index (BMI): 32.5 Blood Pressure (mmHg): 158/82 Reference Range: 80 - 120 mg / dl Electronic Signature(s) Signed: 11/12/2020 4:15:03 PM By: Lorine Bears RCP, RRT, CHT Entered By: Lorine Bears on 11/12/2020 55:73:22

## 2020-11-13 ENCOUNTER — Other Ambulatory Visit: Payer: Self-pay

## 2020-11-13 ENCOUNTER — Encounter: Payer: Medicare HMO | Admitting: Physician Assistant

## 2020-11-13 DIAGNOSIS — M272 Inflammatory conditions of jaws: Secondary | ICD-10-CM | POA: Diagnosis not present

## 2020-11-13 NOTE — Progress Notes (Signed)
[place], [place] ([id number]) Visit Report for 11/13/2020 HBO Details Patient Name: Tyler [name], [name] Date of Service: 11/13/2020 8:00 AM Medical Record Number: [id number] Patient Account Number: [id number] Date of Birth/Sex: [birth year] (70 y.o. M) Treating RN: Carlene Coria Primary Care Ruhi Kopke: [name] Other Clinician: Jacqulyn Bath Referring Halvor Behrend: [name] Treating Ramonte Mena/Extender: Skipper Cliche in Treatment: 5 HBO Treatment Course Details Treatment Course Number: 1 Ordering Yomaira Solar: Jeri Cos Total Treatments Ordered: 30 HBO Treatment Start Date: 11/04/2020 HBO Indication: Osteoradionecrosis of Jaw HBO Treatment Details Treatment Number: 8 Patient Type: Outpatient Chamber Type: Monoplace Chamber Serial #: [id number] Treatment Protocol: 2.5 ATA with 90 minutes oxygen, with two 5 minute air breaks Treatment Details Compression Rate Down: 1.5 psi / minute De-Compression Rate Up: 2.0 psi / minute Compress Tx Pressure Air breaks and breathing periods Decompress Decompress Begins Reached (leave unused spaces blank) Begins Ends Chamber Pressure (ATA) 1 2.5 2.5 2.5 2.5 2.5 - - 2.5 1 Clock Time (24 hr) 08:17 08:32 09:02 09:07 09:37 09:43 - - 10:13 10:25 Treatment Length: 128 (minutes) Treatment Segments: 4 Vital Signs Capillary Blood Glucose Reference Range: 80 - 120 mg / dl HBO Diabetic Blood Glucose Intervention Range: <131 mg/dl or >249 mg/dl Time Vitals Blood Respiratory Capillary Blood Glucose Pulse Action Type: Pulse: Temperature: Taken: Pressure: Rate: Glucose (mg/dl): Meter #: Oximetry (%) Taken: Pre 08:10 138/80 60 18 98.3 Post 10:30 144/80 66 18 98 Treatment Response Treatment Toleration: Well Treatment Completion Treatment Completed without Adverse Event Status: Electronic Signature(s) Signed: 11/13/2020 10:33:55 AM By: Paulla Fore, RRT, CHT Signed: 11/13/2020 4:44:45 PM By: Worthy Keeler PA-C Entered By:  Lorine Bears on 11/13/2020 10:32:40 Tyler [name], Tyler Mary Jane A. ([id number]) -------------------------------------------------------------------------------- HBO Safety Checklist Details Patient Name: Tyler [name], [name] Date of Service: 11/13/2020 8:00 AM Medical Record Number: [id number] Patient Account Number: [id number] Date of Birth/Sex: 1950/10/23 (70 y.o. M) Treating RN: Carlene Coria Primary Care Thaer Miyoshi: [name] Other Clinician: Jacqulyn Bath Referring Hollis Tuller: [name] Treating Mariadelosang Wynns/Extender: Skipper Cliche in Treatment: 5 HBO Safety Checklist Items Safety Checklist Consent Form Signed Patient voided / foley secured and emptied When did you last eato 11/12/20 pm Last dose of injectable or oral agent n/a Ostomy pouch emptied and vented if applicable NA All implantable devices assessed, documented and approved NA Intravenous access site secured and place NA Valuables secured Linens and cotton and cotton/polyester blend (less than 51% polyester) Personal oil-based products / skin lotions / body lotions removed Wigs or hairpieces removed NA Smoking or tobacco materials removed NA Books / newspapers / magazines / loose paper removed NA Cologne, aftershave, perfume and deodorant removed Jewelry removed (may wrap wedding band) Make-up removed NA Hair care products removed Battery operated devices (external) removed NA Heating patches and chemical warmers removed NA Titanium eyewear removed NA Nail polish cured greater than 10 hours NA Casting material cured greater than 10 hours NA Hearing aids removed NA Loose dentures or partials removed NA Prosthetics have been removed NA Patient demonstrates correct use of air break device (if applicable) Patient concerns have been addressed Patient grounding bracelet on and cord attached to chamber Specifics for Inpatients (complete in addition to above) Medication sheet sent with  patient Intravenous medications needed or due during therapy sent with patient Drainage tubes (e.g. nasogastric tube or chest tube secured and vented) Endotracheal or Tracheotomy tube secured Cuff deflated of air and inflated with saline Airway suctioned Electronic Signature(s) Signed: 11/13/2020 10:33:55 AM By: Becky Sax, Sallie RCP, RRT, CHT Entered By:  Becky Sax, Sallie on 11/13/2020 08:35:10

## 2020-11-13 NOTE — Progress Notes (Signed)
[place], [place] ([id number]) Visit Report for 11/13/2020 Arrival Information Details Patient Name: Tyler [name], [name] Date of Service: 11/13/2020 8:00 AM Medical Record Number: [id number] Patient Account Number: [id number] Date of Birth/Sex: [birth year] (70 y.o. M) Treating RN: Carlene Coria Primary Care Provider: [name] Other Clinician: Jacqulyn Bath Referring Provider: [name] Treating Provider/Extender: Skipper Cliche in Treatment: 5 Visit Information History Since Last Visit Added or deleted any medications: No Patient Arrived: Ambulatory Any new allergies or adverse reactions: No Arrival Time: 08:00 Had a fall or experienced change in No Accompanied By: self activities of daily living that may affect Transfer Assistance: None risk of falls: Patient Identification Verified: Yes Signs or symptoms of abuse/neglect since last visito No Secondary Verification Process Completed: Yes Hospitalized since last visit: No Implantable device outside of the clinic excluding No cellular tissue based products placed in the center since last visit: Pain Present Now: No Electronic Signature(s) Signed: 11/13/2020 10:33:55 AM By: Lorine Bears RCP, RRT, CHT Entered By: Lorine Bears on 11/13/2020 08:33:27 [place], [place]. ([id number]) -------------------------------------------------------------------------------- Encounter Discharge Information Details Patient Name: Tyler [name], [name] Date of Service: 11/13/2020 8:00 AM Medical Record Number: [id number] Patient Account Number: [id number] Date of Birth/Sex: 1950/10/08 (70 y.o. M) Treating RN: Carlene Coria Primary Care Provider: [name] Other Clinician: Jacqulyn Bath Referring Provider: [name] Treating Provider/Extender: Skipper Cliche in Treatment: 5 Encounter Discharge Information Items Discharge Condition: Stable Ambulatory Status: Ambulatory Discharge  Destination: Home Transportation: Private Auto Accompanied By: self Schedule Follow-up Appointment: Yes Clinical Summary of Care: Notes Patient has an HBO treatment scheduled on 11/16/20 at 08:00 am. Electronic Signature(s) Signed: 11/13/2020 10:33:55 AM By: Lorine Bears RCP, RRT, CHT Entered By: Lorine Bears on 11/13/2020 10:33:31 [place], [place]. ([id number]) -------------------------------------------------------------------------------- Vitals Details Patient Name: Tyler [name], [name] Date of Service: 11/13/2020 8:00 AM Medical Record Number: [id number] Patient Account Number: [id number] Date of Birth/Sex: 1951/04/21 (70 y.o. M) Treating RN: Carlene Coria Primary Care Provider: [name] Other Clinician: Jacqulyn Bath Referring Provider: [name] Treating Provider/Extender: Skipper Cliche in Treatment: 5 Vital Signs Time Taken: 08:10 Temperature (F): 98.3 Height (in): 72 Pulse (bpm): 60 Weight (lbs): 240 Respiratory Rate (breaths/min): 18 Body Mass Index (BMI): 32.5 Blood Pressure (mmHg): 138/80 Reference Range: 80 - 120 mg / dl Electronic Signature(s) Signed: 11/13/2020 10:33:55 AM By: Lorine Bears RCP, RRT, CHT Entered By: Lorine Bears on 11/13/2020 08:34:06

## 2020-11-16 ENCOUNTER — Encounter: Payer: Medicare HMO | Admitting: Physician Assistant

## 2020-11-16 ENCOUNTER — Other Ambulatory Visit: Payer: Self-pay

## 2020-11-16 DIAGNOSIS — M272 Inflammatory conditions of jaws: Secondary | ICD-10-CM | POA: Diagnosis not present

## 2020-11-16 NOTE — Progress Notes (Signed)
Tyler [name], Tyler [name] ([id number]) Visit Report for 11/16/2020 Arrival Information Details Patient Name: Tyler [name], [name] Date of Service: 11/16/2020 8:00 AM Medical Record Number: [id number] Patient Account Number: [id number] Date of Birth/Sex: [birth year] (70 y.o. M) Treating RN: Carlene Coria Primary Care Provider: [name] Other Clinician: Jacqulyn Bath Referring Provider: [name] Treating Provider/Extender: Skipper Cliche in Treatment: 5 Visit Information History Since Last Visit Added or deleted any medications: No Patient Arrived: Ambulatory Any new allergies or adverse reactions: No Arrival Time: 08:51 Had a fall or experienced change in No Accompanied By: self activities of daily living that may affect Transfer Assistance: None risk of falls: Patient Identification Verified: Yes Signs or symptoms of abuse/neglect since last visito No Secondary Verification Process Completed: Yes Hospitalized since last visit: No Implantable device outside of the clinic excluding No cellular tissue based products placed in the center since last visit: Pain Present Now: No Electronic Signature(s) Signed: 11/16/2020 4:21:31 PM By: Lorine Bears RCP, RRT, CHT Entered By: Lorine Bears on 11/16/2020 08:54:09 [name], [name] ([id number]) -------------------------------------------------------------------------------- Encounter Discharge Information Details Patient Name: [name], [name] Date of Service: 11/16/2020 8:00 AM Medical Record Number: [id number] Patient Account Number: [id number] Date of Birth/Sex: 07/18/1951 (70 y.o. M) Treating RN: Carlene Coria Primary Care Provider: [name] Other Clinician: Jacqulyn Bath Referring Provider: [name] Treating Provider/Extender: Skipper Cliche in Treatment: 5 Encounter Discharge Information Items Discharge Condition: Stable Ambulatory Status: Ambulatory Discharge  Destination: Home Transportation: Private Auto Accompanied By: self Schedule Follow-up Appointment: Yes Clinical Summary of Care: Notes Patient has an HBO treatment scheduled on 11/17/20 at 08:00 am. Electronic Signature(s) Signed: 11/16/2020 4:21:31 PM By: Lorine Bears RCP, RRT, CHT Entered By: Lorine Bears on 11/16/2020 12:01:10 Tyler [name], Tyler [name]. ([id number]) -------------------------------------------------------------------------------- Vitals Details Patient Name: [name], [name] Date of Service: 11/16/2020 8:00 AM Medical Record Number: [id number] Patient Account Number: [id number] Date of Birth/Sex: 07-01-1951 (70 y.o. M) Treating RN: Carlene Coria Primary Care Provider: [name] Other Clinician: Jacqulyn Bath Referring Provider: [name] Treating Provider/Extender: Skipper Cliche in Treatment: 5 Vital Signs Time Taken: 08:10 Temperature (F): 98.1 Height (in): 72 Pulse (bpm): 72 Weight (lbs): 240 Respiratory Rate (breaths/min): 18 Body Mass Index (BMI): 32.5 Blood Pressure (mmHg): 142/80 Reference Range: 80 - 120 mg / dl Electronic Signature(s) Signed: 11/16/2020 4:21:31 PM By: Lorine Bears RCP, RRT, CHT Entered By: Lorine Bears on 11/16/2020 09:00:07

## 2020-11-17 ENCOUNTER — Telehealth: Payer: Self-pay

## 2020-11-17 ENCOUNTER — Encounter: Payer: Medicare HMO | Attending: Physician Assistant | Admitting: Physician Assistant

## 2020-11-17 DIAGNOSIS — M272 Inflammatory conditions of jaws: Secondary | ICD-10-CM | POA: Insufficient documentation

## 2020-11-17 NOTE — Progress Notes (Signed)
[place], [place] ([id number]) Visit Report for 11/17/2020 Arrival Information Details Patient Name: Tyler [name], [name] Date of Service: 11/17/2020 8:00 AM Medical Record Number: [id number] Patient Account Number: [id number] Date of Birth/Sex: 08-26-1951 (70 y.o. M) Treating RN: Dolan Amen Primary Care Haley Fuerstenberg: [name] Other Clinician: Jacqulyn Bath Referring Bunyan Brier: [name] Treating Kai Railsback/Extender: Skipper Cliche in Treatment: 5 Visit Information History Since Last Visit Added or deleted any medications: No Patient Arrived: Ambulatory Any new allergies or adverse reactions: No Arrival Time: 08:00 Had a fall or experienced change in No Accompanied By: self activities of daily living that may affect Transfer Assistance: None risk of falls: Patient Identification Verified: Yes Signs or symptoms of abuse/neglect since last visito No Secondary Verification Process Completed: Yes Hospitalized since last visit: No Implantable device outside of the clinic excluding No cellular tissue based products placed in the center since last visit: Pain Present Now: No Electronic Signature(s) Signed: 11/17/2020 4:02:04 PM By: Lorine Bears RCP, RRT, CHT Entered By: Lorine Bears on 11/17/2020 08:34:50 [place], [place]. ([id number]) -------------------------------------------------------------------------------- Encounter Discharge Information Details Patient Name: Tyler [name], [name] Date of Service: 11/17/2020 8:00 AM Medical Record Number: [id number] Patient Account Number: [id number] Date of Birth/Sex: 1950-11-23 (70 y.o. M) Treating RN: Dolan Amen Primary Care Ashaunti Treptow: [name] Other Clinician: Jacqulyn Bath Referring Odysseus Cada: [name] Treating Ashara Lounsbury/Extender: Skipper Cliche in Treatment: 5 Encounter Discharge Information Items Discharge Condition: Stable Ambulatory Status: Ambulatory Discharge  Destination: Home Transportation: Private Auto Accompanied By: self Schedule Follow-up Appointment: Yes Clinical Summary of Care: Notes Patient has an HBO treatment scheduled on 11/18/20 at 08:00 am. Electronic Signature(s) Signed: 11/17/2020 4:02:04 PM By: Lorine Bears RCP, RRT, CHT Entered By: Lorine Bears on 11/17/2020 10:35:52 [place], [place]. ([id number]) -------------------------------------------------------------------------------- Vitals Details Patient Name: Tyler [name], [name] Date of Service: 11/17/2020 8:00 AM Medical Record Number: [id number] Patient Account Number: [id number] Date of Birth/Sex: 05/08/1951 (70 y.o. M) Treating RN: Dolan Amen Primary Care Jaevian Shean: [name] Other Clinician: Jacqulyn Bath Referring Mushka Laconte: [name] Treating Coal Nearhood/Extender: Skipper Cliche in Treatment: 5 Vital Signs Time Taken: 08:05 Temperature (F): 98.3 Height (in): 72 Pulse (bpm): 66 Weight (lbs): 240 Respiratory Rate (breaths/min): 18 Body Mass Index (BMI): 32.5 Blood Pressure (mmHg): 132/78 Reference Range: 80 - 120 mg / dl Electronic Signature(s) Signed: 11/17/2020 4:02:04 PM By: Lorine Bears RCP, RRT, CHT Entered By: Lorine Bears on 11/17/2020 08:35:37

## 2020-11-17 NOTE — Progress Notes (Signed)
[place], [place] ([id number]) Visit Report for 11/16/2020 HBO Details Patient Name: Tyler [name], [name] Date of Service: 11/16/2020 8:00 AM Medical Record Number: [id number] Patient Account Number: [id number] Date of Birth/Sex: [birth year] (70 y.o. M) Treating RN: Carlene Coria Primary Care Provider: [name] Other Clinician: Jacqulyn Bath Referring Provider: [name] Treating Provider/Extender: Skipper Cliche in Treatment: 5 HBO Treatment Course Details Treatment Course Number: 1 Ordering Provider: Jeri Cos Total Treatments Ordered: 30 HBO Treatment Start Date: 11/04/2020 HBO Indication: Osteoradionecrosis of Jaw HBO Treatment Details Treatment Number: 9 Patient Type: Outpatient Chamber Type: Monoplace Chamber Serial #: [id number] Treatment Protocol: 2.5 ATA with 90 minutes oxygen, with two 5 minute air breaks Treatment Details Compression Rate Down: 1.5 psi / minute De-Compression Rate Up: Compress Tx Pressure Air breaks and breathing periods Decompress Decompress Begins Reached (leave unused spaces blank) Begins Ends Chamber Pressure (ATA) 1 2.5 2.5 2.5 2.5 2.5 - - 2.5 1 Clock Time (24 hr) 08:28 08:43 09:13 09:19 09:49 09:54 - - 10:25 10:37 Treatment Length: 129 (minutes) Treatment Segments: 4 Vital Signs Capillary Blood Glucose Reference Range: 80 - 120 mg / dl HBO Diabetic Blood Glucose Intervention Range: <131 mg/dl or >249 mg/dl Time Vitals Blood Respiratory Capillary Blood Glucose Pulse Action Type: Pulse: Temperature: Taken: Pressure: Rate: Glucose (mg/dl): Meter #: Oximetry (%) Taken: Pre 08:10 142/80 72 18 98.1 Post 10:40 145/84 72 18 97.9 Treatment Response Treatment Toleration: Well Treatment Completion Treatment Completed without Adverse Event Status: Electronic Signature(s) Signed: 11/16/2020 4:21:31 PM By: Paulla Fore, RRT, CHT Signed: 11/16/2020 5:36:08 PM By: Worthy Keeler PA-C Entered By: Lorine Bears on 11/16/2020 12:00:04 Tyler [name], Tyler Ne[place] A. ([id number]) -------------------------------------------------------------------------------- HBO Safety Checklist Details Patient Name: [name], [name] Date of Service: 11/16/2020 8:00 AM Medical Record Number: [id number] Patient Account Number: [id number] Date of Birth/Sex: 09-Oct-1950 (70 y.o. M) Treating RN: Carlene Coria Primary Care Provider: [name] Other Clinician: Jacqulyn Bath Referring Provider: [name] Treating Provider/Extender: Skipper Cliche in Treatment: 5 HBO Safety Checklist Items Safety Checklist Consent Form Signed Patient voided / foley secured and emptied When did you last eato 11/15/20 pm Last dose of injectable or oral agent n/a Ostomy pouch emptied and vented if applicable NA All implantable devices assessed, documented and approved NA Intravenous access site secured and place NA Valuables secured Linens and cotton and cotton/polyester blend (less than 51% polyester) Personal oil-based products / skin lotions / body lotions removed Wigs or hairpieces removed NA Smoking or tobacco materials removed NA Books / newspapers / magazines / loose paper removed NA Cologne, aftershave, perfume and deodorant removed Jewelry removed (may wrap wedding band) Make-up removed NA Hair care products removed Battery operated devices (external) removed NA Heating patches and chemical warmers removed NA Titanium eyewear removed NA Nail polish cured greater than 10 hours NA Casting material cured greater than 10 hours NA Hearing aids removed NA Loose dentures or partials removed NA Prosthetics have been removed NA Patient demonstrates correct use of air break device (if applicable) Patient concerns have been addressed Patient grounding bracelet on and cord attached to chamber Specifics for Inpatients (complete in addition to above) Medication sheet sent with  patient Intravenous medications needed or due during therapy sent with patient Drainage tubes (e.g. nasogastric tube or chest tube secured and vented) Endotracheal or Tracheotomy tube secured Cuff deflated of air and inflated with saline Airway suctioned Electronic Signature(s) Signed: 11/16/2020 4:21:31 PM By: Becky Sax, Sallie RCP, RRT, CHT Entered By: Lorine Bears on  11/16/2020 09:04:40 

## 2020-11-17 NOTE — Telephone Encounter (Signed)
  Chronic Care Management   Outreach Note  11/17/2020 Name: JERMARI TAMARGO MRN: [id number] DOB: Jul 15, 1951  Primary Care Provider: Virginia Crews, MD Reason for referral : Chronic Care Management   An unsuccessful telephone outreach was attempted today. Mr. Lowdermilk was referred to the case management team for assistance with care management and care coordination.    Follow Up Plan:  A member of the care management team will attempt to reach Mr. Bua again within the next two weeks.    [facility] [phone]

## 2020-11-18 ENCOUNTER — Other Ambulatory Visit: Payer: Self-pay

## 2020-11-18 ENCOUNTER — Encounter: Payer: Medicare HMO | Admitting: Internal Medicine

## 2020-11-18 DIAGNOSIS — M272 Inflammatory conditions of jaws: Secondary | ICD-10-CM | POA: Diagnosis not present

## 2020-11-18 NOTE — Progress Notes (Signed)
Tyler [name] ([id number]) Visit Report for 11/18/2020 HBO Details Patient Name: [name], [name] Date of Service: 11/18/2020 8:00 AM Medical Record Number: [id number] Patient Account Number: [id number] Date of Birth/Sex: 05/16/1951 (70 y.o. M) Treating RN: Tyler [name] Primary Care Tyler [name]: Tyler [name] Other Clinician: Jacqulyn [name] Referring Tyler [name]: Tyler [name] Treating Tyler [name]/Extender: Tyler [name] in Treatment: 5 HBO Treatment Course Details Treatment Course Number: 1 Ordering Tyler [name]: Tyler [name] Total Treatments Ordered: 30 HBO Treatment Start Date: 11/04/2020 HBO Indication: Osteoradionecrosis of Jaw HBO Treatment Details Treatment Number: 11 Patient Type: Outpatient Chamber Type: Monoplace Chamber Serial #: [id number] Treatment Protocol: 2.5 ATA with 90 minutes oxygen, with two 5 minute air breaks Treatment Details Compression Rate Down: 1.5 psi / minute De-Compression Rate Up: 2.0 psi / minute Compress Tx Pressure Air breaks and breathing periods Decompress Decompress Begins Reached (leave unused spaces blank) Begins Ends Chamber Pressure (ATA) 1 2.5 2.5 2.5 2.5 2.5 - - 2.5 1 Clock Time (24 hr) 08:26 08:41 09:12 09:17 09:47 08:52 - - 10:22 10:35 Treatment Length: 129 (minutes) Treatment Segments: 4 Vital Signs Capillary Blood Glucose Reference Range: 80 - 120 mg / dl HBO Diabetic Blood Glucose Intervention Range: <131 mg/dl or >249 mg/dl Time Vitals Blood Respiratory Capillary Blood Glucose Pulse Action Type: Pulse: Temperature: Taken: Pressure: Rate: Glucose (mg/dl): Meter #: Oximetry (%) Taken: Pre 08:10 144/80 72 18 98.3 Post 10:45 144/82 72 18 97.7 Treatment Response Treatment Toleration: Well Treatment Completion Treatment Completed without Adverse Event Status: Tyler [name] Notes No concerns with treatment given HBO Attestation I certify that I supervised this HBO treatment in accordance with Medicare guidelines. A trained  emergency response team is readily Yes available per hospital policies and procedures. Continue HBOT as ordered. Yes Electronic Signature(s) Signed: 11/18/2020 5:07:57 PM By: Tyler [name] Entered By: Tyler [name] on 11/18/2020 16:25:44 [place], [place] ([id number]) -------------------------------------------------------------------------------- HBO Safety Checklist Details Patient Name: [name], [name] Date of Service: 11/18/2020 8:00 AM Medical Record Number: [id number] Patient Account Number: [id number] Date of Birth/Sex: 12/24/1950 (70 y.o. M) Treating RN: Tyler [name] Primary Care Tyler [name]: Tyler [name] Other Clinician: Jacqulyn [name] Referring Tyler [name]: Tyler [name] Treating Tyler [name]/Extender: Tyler [name] in Treatment: 5 HBO Safety Checklist Items Safety Checklist Consent Form Signed Patient voided / foley secured and emptied When did you last eato 11/17/20 pm Last dose of injectable or oral agent n/a Ostomy pouch emptied and vented if applicable NA All implantable devices assessed, documented and approved NA Intravenous access site secured and place NA Valuables secured Linens and cotton and cotton/polyester blend (less than 51% polyester) Personal oil-based products / skin lotions / body lotions removed Wigs or hairpieces removed NA Smoking or tobacco materials removed NA Books / newspapers / magazines / loose paper removed NA Cologne, aftershave, perfume and deodorant removed Jewelry removed (may wrap wedding band) Make-up removed NA Hair care products removed Battery operated devices (external) removed NA Heating patches and chemical warmers removed NA Titanium eyewear removed NA Nail polish cured greater than 10 hours NA Casting material cured greater than 10 hours NA Hearing aids removed NA Loose dentures or partials removed NA Prosthetics have been removed NA Patient demonstrates correct use of air break device (if  applicable) Patient concerns have been addressed Patient grounding bracelet on and cord attached to chamber Specifics for Inpatients (complete in addition to above) Medication sheet sent with patient Intravenous medications needed or due during therapy sent with patient Drainage tubes (e.g. nasogastric tube or chest tube secured and vented)  Endotracheal or Tracheotomy tube secured Cuff deflated of air and inflated with saline Airway suctioned Electronic Signature(s) Signed: 11/18/2020 5:06:42 PM By: Tyler [name] Tyler [name] Entered By: Tyler [name] on 11/18/2020 08:44:11

## 2020-11-18 NOTE — Progress Notes (Signed)
[place], [place] ([id number]) Visit Report for 11/17/2020 HBO Details Patient Name: Tyler [name], [name] Date of Service: 11/17/2020 8:00 AM Medical Record Number: [id number] Patient Account Number: [id number] Date of Birth/Sex: 22-Sep-1950 (70 y.o. M) Treating RN: Dolan Amen Primary Care India Jolin: [name] Other Clinician: Jacqulyn Bath Referring Charman Blasco: [name] Treating Kern Gingras/Extender: Skipper Cliche in Treatment: 5 HBO Treatment Course Details Treatment Course Number: 1 Ordering Demico Ploch: Jeri Cos Total Treatments Ordered: 30 HBO Treatment Start Date: 11/04/2020 HBO Indication: Osteoradionecrosis of Jaw HBO Treatment Details Treatment Number: 10 Patient Type: Outpatient Chamber Type: Monoplace Chamber Serial #: [id number] Treatment Protocol: 2.5 ATA with 90 minutes oxygen, with two 5 minute air breaks Treatment Details Compression Rate Down: 1.5 psi / minute De-Compression Rate Up: 2.0 psi / minute Compress Tx Pressure Air breaks and breathing periods Decompress Decompress Begins Reached (leave unused spaces blank) Begins Ends Chamber Pressure (ATA) 1 2.5 2.5 2.5 2.5 2.5 - - 2.5 1 Clock Time (24 hr) 08:14 08:29 09:00 09:05 09:35 09:41 - - 10:11 10:22 Treatment Length: 128 (minutes) Treatment Segments: 4 Vital Signs Capillary Blood Glucose Reference Range: 80 - 120 mg / dl HBO Diabetic Blood Glucose Intervention Range: <131 mg/dl or >249 mg/dl Time Vitals Blood Respiratory Capillary Blood Glucose Pulse Action Type: Pulse: Temperature: Taken: Pressure: Rate: Glucose (mg/dl): Meter #: Oximetry (%) Taken: Pre 08:05 132/78 66 18 98.3 Post 10:25 142/82 60 18 98 Treatment Response Treatment Toleration: Well Treatment Completion Treatment Completed without Adverse Event Status: Electronic Signature(s) Signed: 11/17/2020 4:02:04 PM By: Paulla Fore, RRT, CHT Signed: 11/17/2020 4:50:13 PM By: Worthy Keeler PA-C Entered By:  Lorine Bears on 11/17/2020 10:35:07 [name], [name] ([id number]) -------------------------------------------------------------------------------- HBO Safety Checklist Details Patient Name: Tyler [name], [name] Date of Service: 11/17/2020 8:00 AM Medical Record Number: [id number] Patient Account Number: [id number] Date of Birth/Sex: 1951/01/16 (70 y.o. M) Treating RN: Dolan Amen Primary Care Nnamdi Dacus: [name] Other Clinician: Jacqulyn Bath Referring Klea Nall: [name] Treating Everlene Cunning/Extender: Skipper Cliche in Treatment: 5 HBO Safety Checklist Items Safety Checklist Consent Form Signed Patient voided / foley secured and emptied When did you last eato 11/16/20 pm Last dose of injectable or oral agent n/a Ostomy pouch emptied and vented if applicable NA All implantable devices assessed, documented and approved NA Intravenous access site secured and place NA Valuables secured Linens and cotton and cotton/polyester blend (less than 51% polyester) Personal oil-based products / skin lotions / body lotions removed Wigs or hairpieces removed NA Smoking or tobacco materials removed NA Books / newspapers / magazines / loose paper removed NA Cologne, aftershave, perfume and deodorant removed Jewelry removed (may wrap wedding band) Make-up removed NA Hair care products removed Battery operated devices (external) removed NA Heating patches and chemical warmers removed NA Titanium eyewear removed NA Nail polish cured greater than 10 hours NA Casting material cured greater than 10 hours NA Hearing aids removed NA Loose dentures or partials removed NA Prosthetics have been removed NA Patient demonstrates correct use of air break device (if applicable) Patient concerns have been addressed Patient grounding bracelet on and cord attached to chamber Specifics for Inpatients (complete in addition to above) Medication sheet sent with  patient Intravenous medications needed or due during therapy sent with patient Drainage tubes (e.g. nasogastric tube or chest tube secured and vented) Endotracheal or Tracheotomy tube secured Cuff deflated of air and inflated with saline Airway suctioned Electronic Signature(s) Signed: 11/17/2020 4:02:04 PM By: Becky Sax, Sallie RCP, RRT, CHT Entered By:  Becky Sax, Amado Nash on 11/17/2020 08:36:41

## 2020-11-18 NOTE — Progress Notes (Signed)
[place], [place] ([id number]) Visit Report for 11/18/2020 Arrival Information Details Patient Name: JALANI, ROMINGER A. Date of Service: 11/18/2020 8:00 AM Medical Record Number: [id number] Patient Account Number: [id number] Date of Birth/Sex: 22-Oct-1950 (70 y.o. M) Treating RN: Cornell Barman Primary Care Ciarra Braddy: [name] Other Clinician: Jacqulyn Bath Referring Nephi Savage: [name] Treating Mileah Hemmer/Extender: Tito Dine in Treatment: 5 Visit Information History Since Last Visit Added or deleted any medications: No Patient Arrived: Ambulatory Any new allergies or adverse reactions: No Arrival Time: 08:00 Had a fall or experienced change in No Accompanied By: self activities of daily living that may affect Transfer Assistance: None risk of falls: Patient Identification Verified: Yes Signs or symptoms of abuse/neglect since last visito No Secondary Verification Process Completed: Yes Hospitalized since last visit: No Implantable device outside of the clinic excluding No cellular tissue based products placed in the center since last visit: Pain Present Now: No Electronic Signature(s) Signed: 11/18/2020 5:06:42 PM By: Lorine Bears RCP, RRT, CHT Entered By: Lorine Bears on 11/18/2020 08:41:24 [place], [place]. ([id number]) -------------------------------------------------------------------------------- Encounter Discharge Information Details Patient Name: Ponzo, Hermann A. Date of Service: 11/18/2020 8:00 AM Medical Record Number: [id number] Patient Account Number: [id number] Date of Birth/Sex: [birth year] (70 y.o. M) Treating RN: Cornell Barman Primary Care Twilia Yaklin: [name] Other Clinician: Jacqulyn Bath Referring Chara Marquard: [name] Treating Matalyn Nawaz/Extender: Tito Dine in Treatment: 5 Encounter Discharge Information Items Discharge Condition: Stable Ambulatory Status: Ambulatory Discharge  Destination: Home Transportation: Private Auto Accompanied By: self Schedule Follow-up Appointment: Yes Clinical Summary of Care: Notes Patient has an HBO treatment scheduled on 11/19/20 at 08:00 am. Electronic Signature(s) Signed: 11/18/2020 5:06:42 PM By: Lorine Bears RCP, RRT, CHT Entered By: Lorine Bears on 11/18/2020 11:01:13 [place], [place] A. ([id number]) -------------------------------------------------------------------------------- Vitals Details Patient Name: Kepple, Ja A. Date of Service: 11/18/2020 8:00 AM Medical Record Number: [id number] Patient Account Number: [id number] Date of Birth/Sex: 1951-05-05 (70 y.o. M) Treating RN: Cornell Barman Primary Care Aalia Greulich: [name] Other Clinician: Jacqulyn Bath Referring Dayona Shaheen: [name] Treating Rhylin Venters/Extender: Tito Dine in Treatment: 5 Vital Signs Time Taken: 08:10 Temperature (F): 98.3 Height (in): 72 Pulse (bpm): 72 Weight (lbs): 240 Respiratory Rate (breaths/min): 18 Body Mass Index (BMI): 32.5 Blood Pressure (mmHg): 144/80 Reference Range: 80 - 120 mg / dl Electronic Signature(s) Signed: 11/18/2020 5:06:42 PM By: Lorine Bears RCP, RRT, CHT Entered By: Lorine Bears on 11/18/2020 08:43:12

## 2020-11-19 ENCOUNTER — Encounter: Payer: Medicare HMO | Admitting: Physician Assistant

## 2020-11-19 DIAGNOSIS — M272 Inflammatory conditions of jaws: Secondary | ICD-10-CM | POA: Diagnosis not present

## 2020-11-19 NOTE — Progress Notes (Signed)
[place], [place] ([id number]) Visit Report for 11/19/2020 Arrival Information Details Patient Name: Tyler [name], [name] Date of Service: 11/19/2020 8:00 AM Medical Record Number: [id number] Patient Account Number: [id number] Date of Birth/Sex: [birth year] (70 y.o. M) Treating RN: Dolan Amen Primary Care Shaunak Kreis: [name] Other Clinician: Jacqulyn Bath Referring Bryton Waight: [name] Treating Aala Ransom/Extender: Skipper Cliche in Treatment: 6 Visit Information History Since Last Visit Added or deleted any medications: No Patient Arrived: Ambulatory Any new allergies or adverse reactions: No Arrival Time: 08:00 Had a fall or experienced change in No Accompanied By: self activities of daily living that may affect Transfer Assistance: None risk of falls: Patient Identification Verified: Yes Signs or symptoms of abuse/neglect since last visito No Secondary Verification Process Completed: Yes Hospitalized since last visit: No Implantable device outside of the clinic excluding No cellular tissue based products placed in the center since last visit: Pain Present Now: No Electronic Signature(s) Signed: 11/19/2020 1:08:19 PM By: Lorine Bears RCP, RRT, CHT Entered By: Lorine Bears on 11/19/2020 09:37:58 [place], [place]. ([id number]) -------------------------------------------------------------------------------- Encounter Discharge Information Details Patient Name: Honeycutt, [name] Date of Service: 11/19/2020 8:00 AM Medical Record Number: [id number] Patient Account Number: [id number] Date of Birth/Sex: March 23, 1951 (70 y.o. M) Treating RN: Dolan Amen Primary Care Drago Hammonds: [name] Other Clinician: Jacqulyn Bath Referring Suraj Ramdass: [name] Treating Sherlyn Ebbert/Extender: Skipper Cliche in Treatment: 6 Encounter Discharge Information Items Discharge Condition: Stable Ambulatory Status: Ambulatory Discharge  Destination: Home Transportation: Private Auto Accompanied By: self Schedule Follow-up Appointment: Yes Clinical Summary of Care: Notes Patient has an HBO treatment scheduled on 11/20/20 at 09:00 am. Electronic Signature(s) Signed: 11/19/2020 1:08:19 PM By: Lorine Bears RCP, RRT, CHT Entered By: Lorine Bears on 11/19/2020 10:40:14 [place], [place]. ([id number]) -------------------------------------------------------------------------------- Vitals Details Patient Name: Zaman, [name] Date of Service: 11/19/2020 8:00 AM Medical Record Number: [id number] Patient Account Number: [id number] Date of Birth/Sex: 1951/09/17 (70 y.o. M) Treating RN: Dolan Amen Primary Care Mae Cianci: [name] Other Clinician: Jacqulyn Bath Referring Terrence Wishon: [name] Treating Allah Reason/Extender: Skipper Cliche in Treatment: 6 Vital Signs Time Taken: 08:05 Temperature (F): 97.9 Height (in): 72 Pulse (bpm): 66 Weight (lbs): 240 Respiratory Rate (breaths/min): 18 Body Mass Index (BMI): 32.5 Blood Pressure (mmHg): 142/80 Reference Range: 80 - 120 mg / dl Electronic Signature(s) Signed: 11/19/2020 1:08:19 PM By: Lorine Bears RCP, RRT, CHT Entered By: Lorine Bears on 11/19/2020 09:39:51

## 2020-11-20 ENCOUNTER — Encounter: Payer: Medicare HMO | Admitting: Physician Assistant

## 2020-11-20 ENCOUNTER — Other Ambulatory Visit: Payer: Self-pay

## 2020-11-20 DIAGNOSIS — M272 Inflammatory conditions of jaws: Secondary | ICD-10-CM | POA: Diagnosis not present

## 2020-11-20 NOTE — Progress Notes (Signed)
[place], [place] ([id number]) Visit Report for 11/20/2020 HBO Details Patient Name: Tyler [name], Tyler [name]. Date of Service: 11/20/2020 8:00 AM Medical Record Number: [id number] Patient Account Number: [id number] Date of Birth/Sex: [birth year] (70 y.o. M) Treating RN: Carlene Coria Primary Care Idaly Verret: [name] Other Clinician: Jacqulyn Bath Referring Valinda Fedie: [name] Treating Dave Mannes/Extender: Skipper Cliche in Treatment: 6 HBO Treatment Course Details Treatment Course Number: 1 Ordering Arvil Utz: Jeri Cos Total Treatments Ordered: 30 HBO Treatment Start Date: 11/04/2020 HBO Indication: Osteoradionecrosis of Jaw HBO Treatment Details Treatment Number: 13 Patient Type: Outpatient Chamber Type: Monoplace Chamber Serial #: [id number] Treatment Protocol: 2.5 ATA with 90 minutes oxygen, with two 5 minute air breaks Treatment Details Compression Rate Down: 1.5 psi / minute De-Compression Rate Up: 2.0 psi / minute Compress Tx Pressure Air breaks and breathing periods Decompress Decompress Begins Reached (leave unused spaces blank) Begins Ends Chamber Pressure (ATA) 1 2.5 2.5 2.5 2.5 2.5 - - 2.5 1 Clock Time (24 hr) 08:14 08:29 09:01 09:06 09:36 09:41 - - 10:12 10:23 Treatment Length: 129 (minutes) Treatment Segments: 4 Vital Signs Capillary Blood Glucose Reference Range: 80 - 120 mg / dl HBO Diabetic Blood Glucose Intervention Range: <131 mg/dl or >249 mg/dl Time Vitals Blood Respiratory Capillary Blood Glucose Pulse Action Type: Pulse: Temperature: Taken: Pressure: Rate: Glucose (mg/dl): Meter #: Oximetry (%) Taken: Pre 08:10 146/78 54 18 98.2 Post 10:27 142/80 66 18 98.2 Treatment Response Treatment Toleration: Well Treatment Completion Treatment Completed without Adverse Event Status: Electronic Signature(s) Signed: 11/20/2020 11:30:15 AM By: Worthy Keeler PA-C Signed: 11/20/2020 11:30:32 AM By: Lorine Bears RCP, RRT, CHT Entered By:  Lorine Bears on 11/20/2020 10:35:15 [place], Arin [name]. ([id number]) -------------------------------------------------------------------------------- HBO Safety Checklist Details Patient Name: [place], Eithan [name]. Date of Service: 11/20/2020 8:00 AM Medical Record Number: [id number] Patient Account Number: [id number] Date of Birth/Sex: 04-29-1951 (70 y.o. M) Treating RN: Carlene Coria Primary Care Suriah Peragine: [name] Other Clinician: Jacqulyn Bath Referring Mylin Gignac: [name] Treating Elward Nocera/Extender: Skipper Cliche in Treatment: 6 HBO Safety Checklist Items Safety Checklist Consent Form Signed Patient voided / foley secured and emptied When did you last eato 11/19/20 pm Last dose of injectable or oral agent n/[name] Ostomy pouch emptied and vented if applicable NA All implantable devices assessed, documented and approved NA Intravenous access site secured and place NA Valuables secured Linens and cotton and cotton/polyester blend (less than 51% polyester) Personal oil-based products / skin lotions / body lotions removed Wigs or hairpieces removed NA Smoking or tobacco materials removed NA Books / newspapers / magazines / loose paper removed NA Cologne, aftershave, perfume and deodorant removed Jewelry removed (may wrap wedding band) NA Make-up removed NA Hair care products removed Battery operated devices (external) removed NA Heating patches and chemical warmers removed NA Titanium eyewear removed NA Nail polish cured greater than 10 hours NA Casting material cured greater than 10 hours NA Hearing aids removed NA Loose dentures or partials removed NA Prosthetics have been removed NA Patient demonstrates correct use of air break device (if applicable) Patient concerns have been addressed Patient grounding bracelet on and cord attached to chamber Specifics for Inpatients (complete in addition to above) Medication sheet sent with  patient Intravenous medications needed or due during therapy sent with patient Drainage tubes (e.g. nasogastric tube or chest tube secured and vented) Endotracheal or Tracheotomy tube secured Cuff deflated of air and inflated with saline Airway suctioned Electronic Signature(s) Signed: 11/20/2020 11:30:32 AM By: Becky Sax, Sallie RCP, RRT, CHT Entered  By: Lorine Bears on 11/20/2020 12:81:18

## 2020-11-20 NOTE — Progress Notes (Signed)
[place], [place] ([id number]) Visit Report for 11/19/2020 HBO Details Patient Name: [name], [name] Date of Service: 11/19/2020 8:00 AM Medical Record Number: [id number] Patient Account Number: [id number] Date of Birth/Sex: [birth year] (70 y.o. M) Treating RN: Dolan Amen Primary Care Orine Goga: [name] Other Clinician: Jacqulyn Bath Referring Sandie Swayze: [name] Treating Samual Beals/Extender: Skipper Cliche in Treatment: 6 HBO Treatment Course Details Treatment Course Number: 1 Ordering Shiheem Corporan: Jeri Cos Total Treatments Ordered: 30 HBO Treatment Start Date: 11/04/2020 HBO Indication: Osteoradionecrosis of Jaw HBO Treatment Details Treatment Number: 12 Patient Type: Outpatient Chamber Type: Monoplace Chamber Serial #: [id number] Treatment Protocol: 2.5 ATA with 90 minutes oxygen, with two 5 minute air breaks Treatment Details Compression Rate Down: 1.5 psi / minute De-Compression Rate Up: 2.0 psi / minute Compress Tx Pressure Air breaks and breathing periods Decompress Decompress Begins Reached (leave unused spaces blank) Begins Ends Chamber Pressure (ATA) 1 2.5 2.5 2.5 2.5 2.5 - - 2.5 1 Clock Time (24 hr) 08:14 08:29 08:59 09:05 09:35 09:41 - - 10:11 10:22 Treatment Length: 128 (minutes) Treatment Segments: 4 Vital Signs Capillary Blood Glucose Reference Range: 80 - 120 mg / dl HBO Diabetic Blood Glucose Intervention Range: <131 mg/dl or >249 mg/dl Time Vitals Blood Respiratory Capillary Blood Glucose Pulse Action Type: Pulse: Temperature: Taken: Pressure: Rate: Glucose (mg/dl): Meter #: Oximetry (%) Taken: Pre 08:05 142/80 66 18 97.9 Post 10:25 142/78 66 18 98.3 Treatment Response Treatment Toleration: Well Treatment Completion Treatment Completed without Adverse Event Status: Electronic Signature(s) Signed: 11/19/2020 1:08:19 PM By: Lorine Bears RCP, RRT, CHT Signed: 11/20/2020 11:30:15 AM By: Worthy Keeler PA-C Entered By:  Lorine Bears on 11/19/2020 10:38:40 Tyler [name], [name] ([id number]) -------------------------------------------------------------------------------- HBO Safety Checklist Details Patient Name: [name], [name] Date of Service: 11/19/2020 8:00 AM Medical Record Number: [id number] Patient Account Number: [id number] Date of Birth/Sex: 1950-11-28 (70 y.o. M) Treating RN: Dolan Amen Primary Care Annalese Stiner: [name] Other Clinician: Jacqulyn Bath Referring Shadavia Dampier: [name] Treating Zyon Rosser/Extender: Skipper Cliche in Treatment: 6 HBO Safety Checklist Items Safety Checklist Consent Form Signed Patient voided / foley secured and emptied When did you last eato 11/18/20 pm Last dose of injectable or oral agent n/a Ostomy pouch emptied and vented if applicable NA All implantable devices assessed, documented and approved NA Intravenous access site secured and place NA Valuables secured Linens and cotton and cotton/polyester blend (less than 51% polyester) Personal oil-based products / skin lotions / body lotions removed Wigs or hairpieces removed NA Smoking or tobacco materials removed NA Books / newspapers / magazines / loose paper removed NA Cologne, aftershave, perfume and deodorant removed Jewelry removed (may wrap wedding band) NA Make-up removed NA Hair care products removed Battery operated devices (external) removed NA Heating patches and chemical warmers removed NA Titanium eyewear removed NA Nail polish cured greater than 10 hours NA Casting material cured greater than 10 hours NA Hearing aids removed NA Loose dentures or partials removed NA Prosthetics have been removed NA Patient demonstrates correct use of air break device (if applicable) Patient concerns have been addressed Patient grounding bracelet on and cord attached to chamber Specifics for Inpatients (complete in addition to above) Medication sheet sent with  patient Intravenous medications needed or due during therapy sent with patient Drainage tubes (e.g. nasogastric tube or chest tube secured and vented) Endotracheal or Tracheotomy tube secured Cuff deflated of air and inflated with saline Airway suctioned Electronic Signature(s) Signed: 11/19/2020 1:08:19 PM By: Becky Sax, Sallie RCP, RRT, CHT Entered  By: Lorine Bears on 11/19/2020 09:41:28

## 2020-11-20 NOTE — Progress Notes (Signed)
[place], [place] ([id number]) Visit Report for 11/20/2020 Arrival Information Details Patient Name: Tyler [name], [name] Date of Service: 11/20/2020 8:00 AM Medical Record Number: [id number] Patient Account Number: [id number] Date of Birth/Sex: 1950-09-23 (70 y.o. M) Treating RN: Carlene Coria Primary Care Annamarie Yamaguchi: [name] Other Clinician: Jacqulyn Bath Referring Huxley Vanwagoner: [name] Treating Denetta Fei/Extender: Skipper Cliche in Treatment: 6 Visit Information History Since Last Visit Added or deleted any medications: No Patient Arrived: Ambulatory Any new allergies or adverse reactions: No Arrival Time: 08:00 Had a fall or experienced change in No Accompanied By: self activities of daily living that may affect Transfer Assistance: None risk of falls: Patient Identification Verified: Yes Signs or symptoms of abuse/neglect since last visito No Secondary Verification Process Completed: Yes Hospitalized since last visit: No Implantable device outside of the clinic excluding No cellular tissue based products placed in the center since last visit: Pain Present Now: No Electronic Signature(s) Signed: 11/20/2020 11:30:32 AM By: Lorine Bears RCP, RRT, CHT Entered By: Lorine Bears on 11/20/2020 08:16:45 [place], [place]. ([id number]) -------------------------------------------------------------------------------- Encounter Discharge Information Details Patient Name: Ruffini, [place] A. Date of Service: 11/20/2020 8:00 AM Medical Record Number: [id number] Patient Account Number: [id number] Date of Birth/Sex: 02-08-1951 (70 y.o. M) Treating RN: Carlene Coria Primary Care Ma Munoz: [name] Other Clinician: Jacqulyn Bath Referring Mitra Duling: [name] Treating Naylani Bradner/Extender: Skipper Cliche in Treatment: 6 Encounter Discharge Information Items Discharge Condition: Stable Ambulatory Status: Ambulatory Discharge  Destination: Home Transportation: Private Auto Accompanied By: self Schedule Follow-up Appointment: Yes Clinical Summary of Care: Notes Patient has an HBO treatment scheduled on 11/23/20 at 08:00 am. Electronic Signature(s) Signed: 11/20/2020 11:30:32 AM By: Lorine Bears RCP, RRT, CHT Entered By: Lorine Bears on 11/20/2020 10:37:02 [place], [place] A. ([id number]) -------------------------------------------------------------------------------- Vitals Details Patient Name: Ehresman, Wilver A. Date of Service: 11/20/2020 8:00 AM Medical Record Number: [id number] Patient Account Number: [id number] Date of Birth/Sex: February 25, 1951 (70 y.o. M) Treating RN: Carlene Coria Primary Care Jermaine Neuharth: [name] Other Clinician: Jacqulyn Bath Referring Shawndell Varas: [name] Treating Alexie Samson/Extender: Skipper Cliche in Treatment: 6 Vital Signs Time Taken: 08:10 Temperature (F): 98.2 Height (in): 72 Pulse (bpm): 54 Weight (lbs): 240 Respiratory Rate (breaths/min): 18 Body Mass Index (BMI): 32.5 Blood Pressure (mmHg): 146/78 Reference Range: 80 - 120 mg / dl Electronic Signature(s) Signed: 11/20/2020 11:30:32 AM By: Lorine Bears RCP, RRT, CHT Entered By: Lorine Bears on 11/20/2020 08:17:18

## 2020-11-23 ENCOUNTER — Other Ambulatory Visit: Payer: Self-pay

## 2020-11-23 ENCOUNTER — Encounter: Payer: Medicare HMO | Admitting: Physician Assistant

## 2020-11-23 DIAGNOSIS — M272 Inflammatory conditions of jaws: Secondary | ICD-10-CM | POA: Diagnosis not present

## 2020-11-23 NOTE — Progress Notes (Signed)
[place], [place] ([id number]) Visit Report for 11/23/2020 HBO Details Patient Name: [name], [name] Date of Service: 11/23/2020 8:00 AM Medical Record Number: [id number] Patient Account Number: [id number] Date of Birth/Sex: 17-Sep-1951 (70 y.o. M) Treating RN: Carlene Coria Primary Care Normand Damron: Tyler [name] Other Clinician: Jacqulyn Bath Referring Tyler [name]: Tyler [name] Treating Gwynn Chalker/Extender: Skipper Cliche in Treatment: 6 HBO Treatment Course Details Treatment Course Number: 1 Ordering Errika Narvaiz: Jeri Cos Total Treatments Ordered: 30 HBO Treatment Start Date: 11/04/2020 HBO Indication: Osteoradionecrosis of Jaw HBO Treatment Details Treatment Number: 14 Patient Type: Outpatient Chamber Type: Monoplace Chamber Serial #: [id number] Treatment Protocol: 2.5 ATA with 90 minutes oxygen, with two 5 minute air breaks Treatment Details Compression Rate Down: 1.5 psi / minute De-Compression Rate Up: 2.0 psi / minute Compress Tx Pressure Air breaks and breathing periods Decompress Decompress Begins Reached (leave unused spaces blank) Begins Ends Chamber Pressure (ATA) 1 2.5 2.5 2.5 2.5 2.5 - - 2.5 1 Clock Time (24 hr) 08:13 08:28 08:59 09:04 09:34 09:39 - - 10:10 10:22 Treatment Length: 129 (minutes) Treatment Segments: 4 Vital Signs Capillary Blood Glucose Reference Range: 80 - 120 mg / dl HBO Diabetic Blood Glucose Intervention Range: <131 mg/dl or >249 mg/dl Time Vitals Blood Respiratory Capillary Blood Glucose Pulse Action Type: Pulse: Temperature: Taken: Pressure: Rate: Glucose (mg/dl): Meter #: Oximetry (%) Taken: Pre 08:05 146/76 78 18 98.4 Post 10:25 148/78 60 18 97.9 Treatment Response Treatment Toleration: Well Treatment Completion Treatment Completed without Adverse Event Status: Electronic Signature(s) Signed: 11/23/2020 11:55:30 AM By: Paulla Fore, RRT, CHT Signed: 11/23/2020 12:29:11 PM By: Worthy Keeler PA-C Entered By:  Lorine Bears on 11/23/2020 10:42:26 Tyler [name], [place] A. ([id number]) -------------------------------------------------------------------------------- HBO Safety Checklist Details Patient Name: [name], [name] Date of Service: 11/23/2020 8:00 AM Medical Record Number: [id number] Patient Account Number: [id number] Date of Birth/Sex: [birth year] (70 y.o. M) Treating RN: Carlene Coria Primary Care Tresia Revolorio: Tyler [name] Other Clinician: Jacqulyn Bath Referring Fady Stamps: Tyler [name] Treating Takeya Marquis/Extender: Skipper Cliche in Treatment: 6 HBO Safety Checklist Items Safety Checklist Consent Form Signed Patient voided / foley secured and emptied When did you last eato 11/22/20 pm Last dose of injectable or oral agent n/a Ostomy pouch emptied and vented if applicable NA All implantable devices assessed, documented and approved NA Intravenous access site secured and place NA Valuables secured Linens and cotton and cotton/polyester blend (less than 51% polyester) Personal oil-based products / skin lotions / body lotions removed Wigs or hairpieces removed NA Smoking or tobacco materials removed NA Books / newspapers / magazines / loose paper removed NA Cologne, aftershave, perfume and deodorant removed Jewelry removed (may wrap wedding band) Make-up removed NA Hair care products removed Battery operated devices (external) removed NA Heating patches and chemical warmers removed NA Titanium eyewear removed NA Nail polish cured greater than 10 hours NA Casting material cured greater than 10 hours NA Hearing aids removed NA Loose dentures or partials removed NA Prosthetics have been removed NA Patient demonstrates correct use of air break device (if applicable) Patient concerns have been addressed Patient grounding bracelet on and cord attached to chamber Specifics for Inpatients (complete in addition to above) Medication sheet sent with  patient Intravenous medications needed or due during therapy sent with patient Drainage tubes (e.g. nasogastric tube or chest tube secured and vented) Endotracheal or Tracheotomy tube secured Cuff deflated of air and inflated with saline Airway suctioned Electronic Signature(s) Signed: 11/23/2020 11:55:30 AM By: Becky Sax, Sallie RCP, RRT, CHT Entered By:  Becky Sax, Amado Nash on 11/23/2020 09:33:48

## 2020-11-23 NOTE — Progress Notes (Signed)
[place], [place] ([id number]) Visit Report for 11/23/2020 Arrival Information Details Patient Name: Tyler [name], [name] Date of Service: 11/23/2020 8:00 AM Medical Record Number: [id number] Patient Account Number: [id number] Date of Birth/Sex: 1951-05-23 (70 y.o. M) Treating RN: Carlene Coria Primary Care Norm Wray: [name] Other Clinician: Jacqulyn Bath Referring Mariella Blackwelder: [name] Treating Lazer Wollard/Extender: Skipper Cliche in Treatment: 6 Visit Information History Since Last Visit Added or deleted any medications: No Patient Arrived: Ambulatory Any new allergies or adverse reactions: No Arrival Time: 08:14 Had a fall or experienced change in No Accompanied By: self activities of daily living that may affect Transfer Assistance: None risk of falls: Patient Identification Verified: Yes Signs or symptoms of abuse/neglect since last visito No Secondary Verification Process Completed: Yes Hospitalized since last visit: No Implantable device outside of the clinic excluding No cellular tissue based products placed in the center since last visit: Pain Present Now: No Electronic Signature(s) Signed: 11/23/2020 11:55:30 AM By: Lorine Bears RCP, RRT, CHT Entered By: Lorine Bears on 11/23/2020 08:15:21 [place], [place]. ([id number]) -------------------------------------------------------------------------------- Encounter Discharge Information Details Patient Name: Tyler [name], [name] Date of Service: 11/23/2020 8:00 AM Medical Record Number: [id number] Patient Account Number: [id number] Date of Birth/Sex: [birth year] (70 y.o. M) Treating RN: Carlene Coria Primary Care Mescal Flinchbaugh: [name] Other Clinician: Jacqulyn Bath Referring Ayad Nieman: [name] Treating Egan Sahlin/Extender: Skipper Cliche in Treatment: 6 Encounter Discharge Information Items Discharge Condition: Stable Ambulatory Status: Ambulatory Discharge  Destination: Home Transportation: Private Auto Accompanied By: self Schedule Follow-up Appointment: Yes Clinical Summary of Care: Notes Patient has an HBO treatment scheduled on 11/24/20 at 08:00 am. Electronic Signature(s) Signed: 11/23/2020 11:55:30 AM By: Lorine Bears RCP, RRT, CHT Entered By: Lorine Bears on 11/23/2020 11:32:25 [place], [place]. ([id number]) -------------------------------------------------------------------------------- Vitals Details Patient Name: Tyler [name], [name] Date of Service: 11/23/2020 8:00 AM Medical Record Number: [id number] Patient Account Number: [id number] Date of Birth/Sex: 06/15/1951 (70 y.o. M) Treating RN: Carlene Coria Primary Care Deasia Chiu: [name] Other Clinician: Jacqulyn Bath Referring Tarri Guilfoil: [name] Treating Elsi Stelzer/Extender: Skipper Cliche in Treatment: 6 Vital Signs Time Taken: 08:05 Temperature (F): 98.4 Height (in): 72 Pulse (bpm): 78 Weight (lbs): 240 Respiratory Rate (breaths/min): 18 Body Mass Index (BMI): 32.5 Blood Pressure (mmHg): 146/76 Reference Range: 80 - 120 mg / dl Electronic Signature(s) Signed: 11/23/2020 11:55:30 AM By: Lorine Bears RCP, RRT, CHT Entered By: Lorine Bears on 11/23/2020 08:59:26

## 2020-11-24 ENCOUNTER — Telehealth: Payer: Self-pay

## 2020-11-24 ENCOUNTER — Encounter: Payer: Medicare HMO | Admitting: Physician Assistant

## 2020-11-24 DIAGNOSIS — M272 Inflammatory conditions of jaws: Secondary | ICD-10-CM | POA: Diagnosis not present

## 2020-11-24 NOTE — Telephone Encounter (Signed)
  Chronic Care Management   Outreach Note  11/24/2020 Name: Tyler [name] MRN: [id number] DOB: Aug 06, 1951  Primary Care Provider: Virginia Crews, MD Reason for referral : Chronic Care Management   An unsuccessful telephone outreach was attempted today. [name] was referred to the case management team for assistance with care management and care coordination.    Follow Up Plan:  Phone rang multiple times without option to leave a voice message. A member of the care management team will attempt to reach [name] again within the next week.    [facility] [phone]

## 2020-11-24 NOTE — Progress Notes (Signed)
[place], [place] ([id number]) Visit Report for 11/24/2020 Arrival Information Details Patient Name: Tyler [name], [name] Date of Service: 11/24/2020 8:00 AM Medical Record Number: [id number] Patient Account Number: [id number] Date of Birth/Sex: Jan 17, 1951 (70 y.o. M) Treating RN: Dolan Amen Primary Care Laird Runnion: [name] Other Clinician: Jacqulyn Bath Referring Kimm Sider: [name] Treating Rigley Niess/Extender: Skipper Cliche in Treatment: 6 Visit Information History Since Last Visit Added or deleted any medications: No Patient Arrived: Ambulatory Any new allergies or adverse reactions: No Arrival Time: 08:12 Had a fall or experienced change in No Accompanied By: self activities of daily living that may affect Transfer Assistance: None risk of falls: Patient Identification Verified: Yes Signs or symptoms of abuse/neglect since last visito No Secondary Verification Process Completed: Yes Hospitalized since last visit: No Implantable device outside of the clinic excluding No cellular tissue based products placed in the center since last visit: Pain Present Now: No Electronic Signature(s) Signed: 11/24/2020 10:34:47 AM By: Lorine Bears RCP, RRT, CHT Entered By: Lorine Bears on 11/24/2020 08:13:33 [place], [place]. ([id number]) -------------------------------------------------------------------------------- Encounter Discharge Information Details Patient Name: Eckman, Briston A. Date of Service: 11/24/2020 8:00 AM Medical Record Number: [id number] Patient Account Number: [id number] Date of Birth/Sex: 1950/12/23 (70 y.o. M) Treating RN: Dolan Amen Primary Care Margarita Bobrowski: [name] Other Clinician: Jacqulyn Bath Referring Nels Munn: [name] Treating Ron Beske/Extender: Skipper Cliche in Treatment: 6 Encounter Discharge Information Items Discharge Condition: Stable Ambulatory Status: Ambulatory Discharge  Destination: Home Transportation: Private Auto Accompanied By: self Schedule Follow-up Appointment: Yes Clinical Summary of Care: Notes Patient has an HBO treatment scheduled on 11/25/20 at 08:00 am. Electronic Signature(s) Signed: 11/24/2020 10:34:47 AM By: Lorine Bears RCP, RRT, CHT Entered By: Lorine Bears on 11/24/2020 10:33:51 [place], [place] A. ([id number]) -------------------------------------------------------------------------------- Vitals Details Patient Name: Gendreau, Braydn A. Date of Service: 11/24/2020 8:00 AM Medical Record Number: [id number] Patient Account Number: [id number] Date of Birth/Sex: Feb 06, 1951 (70 y.o. M) Treating RN: Dolan Amen Primary Care Keeshawn Fakhouri: [name] Other Clinician: Jacqulyn Bath Referring Trysten Bernard: [name] Treating Danil Wedge/Extender: Skipper Cliche in Treatment: 6 Vital Signs Time Taken: 08:05 Temperature (F): 98.3 Height (in): 72 Pulse (bpm): 72 Weight (lbs): 240 Respiratory Rate (breaths/min): 18 Body Mass Index (BMI): 32.5 Blood Pressure (mmHg): 144/72 Reference Range: 80 - 120 mg / dl Electronic Signature(s) Signed: 11/24/2020 10:34:47 AM By: Lorine Bears RCP, RRT, CHT Entered By: Lorine Bears on 11/24/2020 08:14:49

## 2020-11-25 ENCOUNTER — Other Ambulatory Visit: Payer: Self-pay

## 2020-11-25 ENCOUNTER — Encounter: Payer: Medicare HMO | Admitting: Physician Assistant

## 2020-11-25 DIAGNOSIS — M272 Inflammatory conditions of jaws: Secondary | ICD-10-CM | POA: Diagnosis not present

## 2020-11-25 NOTE — Progress Notes (Signed)
[place], [place] ([id number]) Visit Report for 11/25/2020 Arrival Information Details Patient Name: Tyler [name], [name] Date of Service: 11/25/2020 8:00 AM Medical Record Number: [id number] Patient Account Number: [id number] Date of Birth/Sex: May 15, 1951 (70 y.o. M) Treating RN: Cornell Barman Primary Care Amelia Burgard: [name] Other Clinician: Jacqulyn Bath Referring Britanny Marksberry: [name] Treating Unnamed Hino/Extender: Skipper Cliche in Treatment: 6 Visit Information History Since Last Visit Added or deleted any medications: No Patient Arrived: Ambulatory Any new allergies or adverse reactions: No Arrival Time: 08:23 Had a fall or experienced change in No Accompanied By: [id number] activities of daily living that may affect Transfer Assistance: None risk of falls: Patient Identification Verified: Yes Signs or symptoms of abuse/neglect since last visito No Secondary Verification Process Completed: Yes Hospitalized since last visit: No Implantable device outside of the clinic excluding No cellular tissue based products placed in the center since last visit: Pain Present Now: No Electronic Signature(s) Signed: 11/25/2020 1:42:08 PM By: Lorine Bears RCP, RRT, CHT Entered By: Lorine Bears on 11/25/2020 08:24:07 [place], [place] A. ([id number]) -------------------------------------------------------------------------------- Encounter Discharge Information Details Patient Name: [name], [name] Date of Service: 11/25/2020 8:00 AM Medical Record Number: [id number] Patient Account Number: [id number] Date of Birth/Sex: Oct 09, 1950 (70 y.o. M) Treating RN: Cornell Barman Primary Care Jennae Hakeem: [name] Other Clinician: Jacqulyn Bath Referring Tyjay Galindo: [name] Treating Deisy Ozbun/Extender: Skipper Cliche in Treatment: 6 Encounter Discharge Information Items Discharge Condition: Stable Ambulatory Status: Ambulatory Discharge Destination:  Home Transportation: Private Auto Accompanied By: self Schedule Follow-up Appointment: Yes Clinical Summary of Care: Notes Patient has an HBO treatment scheduled on 11/26/20 at 08:00 am. Electronic Signature(s) Signed: 11/25/2020 1:42:08 PM By: Lorine Bears RCP, RRT, CHT Entered By: Lorine Bears on 11/25/2020 10:44:37 [place], [place] A. ([id number]) -------------------------------------------------------------------------------- Vitals Details Patient Name: [name], [name] Date of Service: 11/25/2020 8:00 AM Medical Record Number: [id number] Patient Account Number: [id number] Date of Birth/Sex: 1951/06/10 (70 y.o. M) Treating RN: Cornell Barman Primary Care Raylie Maddison: [name] Other Clinician: Jacqulyn Bath Referring Juanmiguel Defelice: [name] Treating Carine Nordgren/Extender: Skipper Cliche in Treatment: 6 Vital Signs Time Taken: 08:05 Temperature (F): 98.0 Height (in): 72 Pulse (bpm): 54 Weight (lbs): 240 Respiratory Rate (breaths/min): 18 Body Mass Index (BMI): 32.5 Blood Pressure (mmHg): 138/78 Reference Range: 80 - 120 mg / dl Electronic Signature(s) Signed: 11/25/2020 1:42:08 PM By: Lorine Bears RCP, RRT, CHT Entered By: Becky Sax, Amado Nash on 11/25/2020 08:25:47

## 2020-11-25 NOTE — Progress Notes (Signed)
[place], [place] ([id number]) Visit Report for 11/24/2020 HBO Details Patient Name: [name], [name] Date of Service: 11/24/2020 8:00 AM Medical Record Number: [id number] Patient Account Number: [id number] Date of Birth/Sex: 05-07-1951 (70 y.o. M) Treating RN: Dolan Amen Primary Care Abbott Jasinski: [name] Other Clinician: Jacqulyn Bath Referring Amron Guerrette: [name] Treating Roux Brandy/Extender: Skipper Cliche in Treatment: 6 HBO Treatment Course Details Treatment Course Number: 1 Ordering Lalita Ebel: Jeri Cos Total Treatments Ordered: 30 HBO Treatment Start Date: 11/04/2020 HBO Indication: Osteoradionecrosis of Jaw HBO Treatment Details Treatment Number: 15 Patient Type: Outpatient Chamber Type: Monoplace Chamber Serial #: [id number] Treatment Protocol: 2.5 ATA with 90 minutes oxygen, with two 5 minute air breaks Treatment Details Compression Rate Down: 1.5 psi / minute De-Compression Rate Up: 2.0 psi / minute Compress Tx Pressure Air breaks and breathing periods Decompress Decompress Begins Reached (leave unused spaces blank) Begins Ends Chamber Pressure (ATA) 1 2.5 2.5 2.5 2.5 2.5 - - 2.5 1 Clock Time (24 hr) 08:11 08:26 08:56 09:02 09:32 09:37 - - 10:07 10:17 Treatment Length: 126 (minutes) Treatment Segments: 4 Vital Signs Capillary Blood Glucose Reference Range: 80 - 120 mg / dl HBO Diabetic Blood Glucose Intervention Range: <131 mg/dl or >249 mg/dl Time Vitals Blood Respiratory Capillary Blood Glucose Pulse Action Type: Pulse: Temperature: Taken: Pressure: Rate: Glucose (mg/dl): Meter #: Oximetry (%) Taken: Pre 08:05 144/72 72 18 98.3 Post 10:20 132/80 66 18 98 Treatment Response Treatment Toleration: Well Treatment Completion Treatment Completed without Adverse Event Status: Electronic Signature(s) Signed: 11/24/2020 10:34:47 AM By: Lorine Bears RCP, RRT, CHT Signed: 11/25/2020 8:14:32 AM By: Worthy Keeler PA-C Entered By:  Lorine Bears on 11/24/2020 10:33:00 Tyler [name], [name] ([id number]) -------------------------------------------------------------------------------- HBO Safety Checklist Details Patient Name: Tyler [name], [name] Date of Service: 11/24/2020 8:00 AM Medical Record Number: [id number] Patient Account Number: [id number] Date of Birth/Sex: 1951-09-17 (70 y.o. M) Treating RN: Dolan Amen Primary Care Kadence Mimbs: [name] Other Clinician: Jacqulyn Bath Referring Haidyn Chadderdon: [name] Treating Matthieu Loftus/Extender: Skipper Cliche in Treatment: 6 HBO Safety Checklist Items Safety Checklist Consent Form Signed Patient voided / foley secured and emptied When did you last eato 11/23/20 pm Last dose of injectable or oral agent n/a Ostomy pouch emptied and vented if applicable NA All implantable devices assessed, documented and approved NA Intravenous access site secured and place NA Valuables secured Linens and cotton and cotton/polyester blend (less than 51% polyester) Personal oil-based products / skin lotions / body lotions removed Wigs or hairpieces removed NA Smoking or tobacco materials removed NA Books / newspapers / magazines / loose paper removed NA Cologne, aftershave, perfume and deodorant removed Jewelry removed (may wrap wedding band) Make-up removed NA Hair care products removed Battery operated devices (external) removed NA Heating patches and chemical warmers removed NA Titanium eyewear removed NA Nail polish cured greater than 10 hours NA Casting material cured greater than 10 hours NA Hearing aids removed NA Loose dentures or partials removed NA Prosthetics have been removed NA Patient demonstrates correct use of air break device (if applicable) Patient concerns have been addressed Patient grounding bracelet on and cord attached to chamber Specifics for Inpatients (complete in addition to above) Medication sheet sent with  patient Intravenous medications needed or due during therapy sent with patient Drainage tubes (e.g. nasogastric tube or chest tube secured and vented) Endotracheal or Tracheotomy tube secured Cuff deflated of air and inflated with saline Airway suctioned Electronic Signature(s) Signed: 11/24/2020 10:34:47 AM By: Becky Sax, Sallie RCP, RRT, CHT Entered By:  Becky Sax, Sallie on 11/24/2020 08:16:02

## 2020-11-26 ENCOUNTER — Encounter: Payer: Medicare HMO | Admitting: Physician Assistant

## 2020-11-26 DIAGNOSIS — M272 Inflammatory conditions of jaws: Secondary | ICD-10-CM | POA: Diagnosis not present

## 2020-11-26 DIAGNOSIS — M8788 Other osteonecrosis, other site: Secondary | ICD-10-CM | POA: Diagnosis not present

## 2020-11-26 NOTE — Progress Notes (Signed)
Tyler [name], Tyler [name] ([id number]) Visit Report for 11/26/2020 HBO Details Patient Name: Tyler [name], [name] Date of Service: 11/26/2020 8:00 AM Medical Record Number: [id number] Patient Account Number: [id number] Date of Birth/Sex: 1951/04/05 (70 y.o. M) Treating RN: Dolan Amen Primary Care Lannah Koike: [name] Other Clinician: Jacqulyn Bath Referring Jonise Weightman: [name] Treating Dorthie Santini/Extender: Skipper Cliche in Treatment: 7 HBO Treatment Course Details Treatment Course Number: 1 Ordering Keigo Whalley: Jeri Cos Total Treatments Ordered: 30 HBO Treatment Start Date: 11/04/2020 HBO Indication: Osteoradionecrosis of Jaw HBO Treatment Details Treatment Number: 17 Patient Type: Outpatient Chamber Type: Monoplace Chamber Serial #: [id number] Treatment Protocol: 2.5 ATA with 90 minutes oxygen, with two 5 minute air breaks Treatment Details Compression Rate Down: 1.5 psi / minute De-Compression Rate Up: 2.5 psi / minute Compress Tx Pressure Air breaks and breathing periods Decompress Decompress Begins Reached (leave unused spaces blank) Begins Ends Chamber Pressure (ATA) 1 2.5 2.5 2.5 2.5 2.5 - - 2.5 1 Clock Time (24 hr) 08:07 08:22 08:52 08:57 09:28 09:34 - - 10:04 10:15 Treatment Length: 128 (minutes) Treatment Segments: 4 Vital Signs Capillary Blood Glucose Reference Range: 80 - 120 mg / dl HBO Diabetic Blood Glucose Intervention Range: <131 mg/dl or >249 mg/dl Time Vitals Blood Respiratory Capillary Blood Glucose Pulse Action Type: Pulse: Temperature: Taken: Pressure: Rate: Glucose (mg/dl): Meter #: Oximetry (%) Taken: Pre 08:05 154/75 57 18 98.1 Post 10:17 142/82 60 18 98 Treatment Response Treatment Toleration: Well Treatment Completion Treatment Completed without Adverse Event Status: Electronic Signature(s) Signed: 11/26/2020 11:11:52 AM By: Worthy Keeler PA-C Signed: 11/26/2020 12:17:24 PM By: Lorine Bears RCP, RRT, CHT Entered  By: Lorine Bears on 11/26/2020 11:03:43 Tyler [name], [name] ([id number]) -------------------------------------------------------------------------------- HBO Safety Checklist Details Patient Name: Mcelhaney, Zayon A. Date of Service: 11/26/2020 8:00 AM Medical Record Number: [id number] Patient Account Number: [id number] Date of Birth/Sex: January 09, 1951 (70 y.o. M) Treating RN: Dolan Amen Primary Care Murray Durrell: [name] Other Clinician: Jacqulyn Bath Referring Briya Lookabaugh: [name] Treating Delanee Xin/Extender: Skipper Cliche in Treatment: 7 HBO Safety Checklist Items Safety Checklist Consent Form Signed Patient voided / foley secured and emptied When did you last eato 3/8/22pm Last dose of injectable or oral agent n/a Ostomy pouch emptied and vented if applicable NA All implantable devices assessed, documented and approved NA Intravenous access site secured and place NA Valuables secured Linens and cotton and cotton/polyester blend (less than 51% polyester) Personal oil-based products / skin lotions / body lotions removed Wigs or hairpieces removed NA Smoking or tobacco materials removed NA Books / newspapers / magazines / loose paper removed NA Cologne, aftershave, perfume and deodorant removed Jewelry removed (may wrap wedding band) Make-up removed NA Hair care products removed Battery operated devices (external) removed NA Heating patches and chemical warmers removed NA Titanium eyewear removed NA Nail polish cured greater than 10 hours NA Casting material cured greater than 10 hours NA Hearing aids removed NA Loose dentures or partials removed NA Prosthetics have been removed NA Patient demonstrates correct use of air break device (if applicable) Patient concerns have been addressed Patient grounding bracelet on and cord attached to chamber Specifics for Inpatients (complete in addition to above) Medication sheet sent with  patient Intravenous medications needed or due during therapy sent with patient Drainage tubes (e.g. nasogastric tube or chest tube secured and vented) Endotracheal or Tracheotomy tube secured Cuff deflated of air and inflated with saline Airway suctioned Electronic Signature(s) Signed: 11/26/2020 12:17:24 PM By: Lorine Bears RCP, RRT, CHT Entered By: Juleen China,  RCP,RRT,CHT, Sallie on 11/26/2020 08:15:05

## 2020-11-26 NOTE — Progress Notes (Signed)
[place], [place] ([id number]) Visit Report for 11/26/2020 Arrival Information Details Patient Name: Tyler [name], [name] Date of Service: 11/26/2020 8:00 AM Medical Record Number: [id number] Patient Account Number: [id number] Date of Birth/Sex: 1951-07-02 (70 y.o. M) Treating RN: Tyler [name] Amen Primary Care Cameryn Chrisley: [name] Other Clinician: Jacqulyn Bath Referring Dimitry Holsworth: [name] Treating Walida Cajas/Extender: Skipper Cliche in Treatment: 7 Visit Information History Since Last Visit Added or deleted any medications: No Patient Arrived: Ambulatory Any new allergies or adverse reactions: No Arrival Time: 07:55 Had a fall or experienced change in No Accompanied By: self activities of daily living that may affect Transfer Assistance: None risk of falls: Patient Identification Verified: Yes Signs or symptoms of abuse/neglect since last visito No Secondary Verification Process Completed: Yes Hospitalized since last visit: No Implantable device outside of the clinic excluding No cellular tissue based products placed in the center since last visit: Pain Present Now: No Electronic Signature(s) Signed: 11/26/2020 12:17:24 PM By: Lorine Bears RCP, RRT, CHT Entered By: Lorine Bears on 11/26/2020 08:08:23 [place], [place]. ([id number]) -------------------------------------------------------------------------------- Encounter Discharge Information Details Patient Name: Tyler [name], [name] Date of Service: 11/26/2020 8:00 AM Medical Record Number: [id number] Patient Account Number: [id number] Date of Birth/Sex: 18-May-1951 (70 y.o. M) Treating RN: Tyler [name] Amen Primary Care Dealva Lafoy: [name] Other Clinician: Jacqulyn Bath Referring Robbie Rideaux: [name] Treating Vika Buske/Extender: Skipper Cliche in Treatment: 7 Encounter Discharge Information Items Discharge Condition: Stable Ambulatory Status: Ambulatory Discharge  Destination: Home Transportation: Private Auto Accompanied By: self Schedule Follow-up Appointment: Yes Clinical Summary of Care: Notes Patient has an HBO treatment scheduled on 11/27/20 at 08:00 am. Electronic Signature(s) Signed: 11/26/2020 12:17:24 PM By: Lorine Bears RCP, RRT, CHT Entered By: Lorine Bears on 11/26/2020 11:05:34 [place], [place] A. ([id number]) -------------------------------------------------------------------------------- Vitals Details Patient Name: Tyler [name], [name] Date of Service: 11/26/2020 8:00 AM Medical Record Number: [id number] Patient Account Number: [id number] Date of Birth/Sex: 02-06-1951 (70 y.o. M) Treating RN: Tyler [name] Amen Primary Care Natally Ribera: [name] Other Clinician: Jacqulyn Bath Referring Jessa Stinson: [name] Treating Itzelle Gains/Extender: Skipper Cliche in Treatment: 7 Vital Signs Time Taken: 08:05 Temperature (F): 98.1 Height (in): 72 Pulse (bpm): 57 Weight (lbs): 240 Respiratory Rate (breaths/min): 18 Body Mass Index (BMI): 32.5 Blood Pressure (mmHg): 154/75 Reference Range: 80 - 120 mg / dl Electronic Signature(s) Signed: 11/26/2020 12:17:24 PM By: Lorine Bears RCP, RRT, CHT Entered By: Lorine Bears on 11/26/2020 08:13:39

## 2020-11-26 NOTE — Progress Notes (Signed)
[place], [place] ([id number]) Visit Report for 11/25/2020 HBO Details Patient Name: Tyler [name], Tyler [name]. Date of Service: 11/25/2020 8:00 AM Medical Record Number: [id number] Patient Account Number: [id number] Date of Birth/Sex: Jan 11, 1951 (70 y.o. M) Treating RN: Cornell Barman Primary Care Zadiel Leyh: [name] Other Clinician: Jacqulyn Bath Referring Semya Klinke: [name] Treating Zhanae Proffit/Extender: Skipper Cliche in Treatment: 6 HBO Treatment Course Details Treatment Course Number: 1 Ordering Haygen Zebrowski: Jeri Cos Total Treatments Ordered: 30 HBO Treatment Start Date: 11/04/2020 HBO Indication: Osteoradionecrosis of Jaw HBO Treatment Details Treatment Number: 16 Patient Type: Outpatient Chamber Type: Monoplace Chamber Serial #: [id number] Treatment Protocol: 2.5 ATA with 90 minutes oxygen, with two 5 minute air breaks Treatment Details Compression Rate Down: 1.5 psi / minute De-Compression Rate Up: 2.5 psi / minute Compress Tx Pressure Air breaks and breathing periods Decompress Decompress Begins Reached (leave unused spaces blank) Begins Ends Chamber Pressure (ATA) 1 2.5 2.5 2.5 2.5 2.5 - - 2.5 1 Clock Time (24 hr) 08:09 08:24 08:55 09:00 09:31 09:36 - - 10:07 10:16 Treatment Length: 127 (minutes) Treatment Segments: 4 Vital Signs Capillary Blood Glucose Reference Range: 80 - 120 mg / dl HBO Diabetic Blood Glucose Intervention Range: <131 mg/dl or >249 mg/dl Time Vitals Blood Respiratory Capillary Blood Glucose Pulse Action Type: Pulse: Temperature: Taken: Pressure: Rate: Glucose (mg/dl): Meter #: Oximetry (%) Taken: Pre 08:05 138/78 54 18 98 Post 10:18 140/80 54 18 98.3 Treatment Response Treatment Toleration: Well Treatment Completion Treatment Completed without Adverse Event Status: Electronic Signature(s) Signed: 11/25/2020 1:42:08 PM By: Paulla Fore, RRT, CHT Signed: 11/25/2020 6:23:15 PM By: Worthy Keeler PA-C Entered By: Lorine Bears on 11/25/2020 10:43:27 [place], [place] [name]. ([id number]) -------------------------------------------------------------------------------- HBO Safety Checklist Details Patient Name: Tyler [name]. Date of Service: 11/25/2020 8:00 AM Medical Record Number: [id number] Patient Account Number: [id number] Date of Birth/Sex: [birth year] (70 y.o. M) Treating RN: Cornell Barman Primary Care Holland Kotter: [name] Other Clinician: Jacqulyn Bath Referring Arcangel Minion: [name] Treating Valdis Bevill/Extender: Skipper Cliche in Treatment: 6 HBO Safety Checklist Items Safety Checklist Consent Form Signed Patient voided / foley secured and emptied When did you last eato 11/24/20 pm Last dose of injectable or oral agent n/[name] Ostomy pouch emptied and vented if applicable NA All implantable devices assessed, documented and approved NA Intravenous access site secured and place NA Valuables secured Linens and cotton and cotton/polyester blend (less than 51% polyester) Personal oil-based products / skin lotions / body lotions removed Wigs or hairpieces removed NA Smoking or tobacco materials removed NA Books / newspapers / magazines / loose paper removed NA Cologne, aftershave, perfume and deodorant removed Jewelry removed (may wrap wedding band) Make-up removed NA Hair care products removed Battery operated devices (external) removed NA Heating patches and chemical warmers removed NA Titanium eyewear removed NA Nail polish cured greater than 10 hours NA Casting material cured greater than 10 hours NA Hearing aids removed NA Loose dentures or partials removed NA Prosthetics have been removed NA Patient demonstrates correct use of air break device (if applicable) Patient concerns have been addressed Patient grounding bracelet on and cord attached to chamber Specifics for Inpatients (complete in addition to above) Medication sheet sent with  patient Intravenous medications needed or due during therapy sent with patient Drainage tubes (e.g. nasogastric tube or chest tube secured and vented) Endotracheal or Tracheotomy tube secured Cuff deflated of air and inflated with saline Airway suctioned Electronic Signature(s) Signed: 11/25/2020 1:42:08 PM By: Becky Sax, Sallie RCP, RRT, CHT Entered By:  Becky Sax, Sallie on 11/25/2020 08:27:21

## 2020-11-27 ENCOUNTER — Encounter: Payer: Medicare HMO | Admitting: Physician Assistant

## 2020-11-27 ENCOUNTER — Other Ambulatory Visit: Payer: Self-pay

## 2020-11-27 DIAGNOSIS — M272 Inflammatory conditions of jaws: Secondary | ICD-10-CM | POA: Diagnosis not present

## 2020-11-27 NOTE — Progress Notes (Signed)
[place], [place] ([id number]) Visit Report for 11/27/2020 HBO Details Patient Name: [name], [name] Date of Service: 11/27/2020 8:00 AM Medical Record Number: [id number] Patient Account Number: [id number] Date of Birth/Sex: Jan 02, 1951 (70 y.o. M) Treating RN: Carlene Coria Primary Care Carvell Hoeffner: Lavon Paganini Other Clinician: Jacqulyn Bath Referring Keyira Mondesir: Lavon Paganini Treating Kristain Filo/Extender: Skipper Cliche in Treatment: 7 HBO Treatment Course Details Treatment Course Number: 1 Ordering Mckaylah Bettendorf: Jeri Cos Total Treatments Ordered: 30 HBO Treatment Start Date: 11/04/2020 HBO Indication: Osteoradionecrosis of Jaw HBO Treatment Details Treatment Number: 18 Patient Type: Outpatient Chamber Type: Monoplace Chamber Serial #: [id number] Treatment Protocol: 2.5 ATA with 90 minutes oxygen, with two 5 minute air breaks Treatment Details Compression Rate Down: 1.5 psi / minute De-Compression Rate Up: 2.5 psi / minute Compress Tx Pressure Air breaks and breathing periods Decompress Decompress Begins Reached (leave unused spaces blank) Begins Ends Chamber Pressure (ATA) 1 2.5 2.5 2.5 2.5 2.5 - - 2.5 1 Clock Time (24 hr) 08:11 08:26 08:57 09:03 09:33 09:38 - - 10:08 10:19 Treatment Length: 128 (minutes) Treatment Segments: 4 Vital Signs Capillary Blood Glucose Reference Range: 80 - 120 mg / dl HBO Diabetic Blood Glucose Intervention Range: <131 mg/dl or >249 mg/dl Time Vitals Blood Respiratory Capillary Blood Glucose Pulse Action Type: Pulse: Temperature: Taken: Pressure: Rate: Glucose (mg/dl): Meter #: Oximetry (%) Taken: Pre 08:05 144/72 78 18 98 Post 10:22 132/74 60 18 98.4 Treatment Response Treatment Toleration: Well Treatment Completion Treatment Completed without Adverse Event Status: Electronic Signature(s) Signed: 11/27/2020 11:27:27 AM By: Worthy Keeler PA-C Signed: 11/27/2020 11:58:27 AM By: Lorine Bears RCP, RRT, CHT Entered By:  Lorine Bears on 11/27/2020 10:39:51 [place], [place] A. ([id number]) -------------------------------------------------------------------------------- HBO Safety Checklist Details Patient Name: [name], [name] Date of Service: 11/27/2020 8:00 AM Medical Record Number: [id number] Patient Account Number: [id number] Date of Birth/Sex: 1951-01-08 (70 y.o. M) Treating RN: Carlene Coria Primary Care Karlo Goeden: Lavon Paganini Other Clinician: Jacqulyn Bath Referring Keysi Oelkers: Lavon Paganini Treating Maxima Skelton/Extender: Skipper Cliche in Treatment: 7 HBO Safety Checklist Items Safety Checklist Consent Form Signed Patient voided / foley secured and emptied When did you last eato 11/26/20 pm Last dose of injectable or oral agent n/a Ostomy pouch emptied and vented if applicable NA All implantable devices assessed, documented and approved NA Intravenous access site secured and place NA Valuables secured Linens and cotton and cotton/polyester blend (less than 51% polyester) Personal oil-based products / skin lotions / body lotions removed Wigs or hairpieces removed NA Smoking or tobacco materials removed NA Books / newspapers / magazines / loose paper removed NA Cologne, aftershave, perfume and deodorant removed Jewelry removed (may wrap wedding band) Make-up removed NA Hair care products removed Battery operated devices (external) removed NA Heating patches and chemical warmers removed NA Titanium eyewear removed NA Nail polish cured greater than 10 hours NA Casting material cured greater than 10 hours NA Hearing aids removed NA Loose dentures or partials removed NA Prosthetics have been removed NA Patient demonstrates correct use of air break device (if applicable) Patient concerns have been addressed Patient grounding bracelet on and cord attached to chamber Specifics for Inpatients (complete in addition to above) Medication sheet sent with  patient Intravenous medications needed or due during therapy sent with patient Drainage tubes (e.g. nasogastric tube or chest tube secured and vented) Endotracheal or Tracheotomy tube secured Cuff deflated of air and inflated with saline Airway suctioned Electronic Signature(s) Signed: 11/27/2020 11:58:27 AM By: Becky Sax, Sallie RCP, RRT, CHT Entered By:  Becky Sax, Sallie on 11/27/2020 08:51:10

## 2020-11-27 NOTE — Progress Notes (Signed)
[place], [place] ([id number]) Visit Report for 11/27/2020 Arrival Information Details Patient Name: ARTIS, BEGGS A. Date of Service: 11/27/2020 8:00 AM Medical Record Number: [id number] Patient Account Number: [id number] Date of Birth/Sex: [birth year] (70 y.o. M) Treating RN: Carlene Coria Primary Care Elizah Lydon: Lavon Paganini Other Clinician: Jacqulyn Bath Referring Chelsie Burel: Lavon Paganini Treating Mohd Clemons/Extender: Skipper Cliche in Treatment: 7 Visit Information History Since Last Visit Added or deleted any medications: No Patient Arrived: Ambulatory Any new allergies or adverse reactions: No Arrival Time: 08:49 Had a fall or experienced change in No Accompanied By: self activities of daily living that may affect Transfer Assistance: None risk of falls: Patient Identification Verified: Yes Signs or symptoms of abuse/neglect since last visito No Secondary Verification Process Completed: Yes Hospitalized since last visit: No Implantable device outside of the clinic excluding No cellular tissue based products placed in the center since last visit: Pain Present Now: No Electronic Signature(s) Signed: 11/27/2020 11:58:27 AM By: Lorine Bears RCP, RRT, CHT Entered By: Lorine Bears on 11/27/2020 08:49:36 [place], [place]. ([id number]) -------------------------------------------------------------------------------- Encounter Discharge Information Details Patient Name: Spearing, Mordechai A. Date of Service: 11/27/2020 8:00 AM Medical Record Number: [id number] Patient Account Number: [id number] Date of Birth/Sex: Aug 31, 1951 (70 y.o. M) Treating RN: Carlene Coria Primary Care Akiba Melfi: Lavon Paganini Other Clinician: Jacqulyn Bath Referring Laylynn Campanella: Lavon Paganini Treating Marsha Hillman/Extender: Skipper Cliche in Treatment: 7 Encounter Discharge Information Items Discharge Condition: Stable Ambulatory Status: Ambulatory Discharge  Destination: Home Transportation: Private Auto Accompanied By: self Schedule Follow-up Appointment: Yes Clinical Summary of Care: Notes Patient has an HBO treatment scheduled on 11/30/20 at 08:00 am. Electronic Signature(s) Signed: 11/27/2020 11:58:27 AM By: Lorine Bears RCP, RRT, CHT Entered By: Lorine Bears on 11/27/2020 10:40:50 [place], [place] A. ([id number]) -------------------------------------------------------------------------------- Vitals Details Patient Name: Tulloch, Lemon A. Date of Service: 11/27/2020 8:00 AM Medical Record Number: [id number] Patient Account Number: [id number] Date of Birth/Sex: Sep 29, 1950 (70 y.o. M) Treating RN: Carlene Coria Primary Care Mashawn Brazil: Lavon Paganini Other Clinician: Jacqulyn Bath Referring Farren Nelles: Lavon Paganini Treating Calloway Andrus/Extender: Skipper Cliche in Treatment: 7 Vital Signs Time Taken: 08:05 Temperature (F): 98.0 Height (in): 72 Pulse (bpm): 78 Weight (lbs): 240 Respiratory Rate (breaths/min): 18 Body Mass Index (BMI): 32.5 Blood Pressure (mmHg): 144/72 Reference Range: 80 - 120 mg / dl Electronic Signature(s) Signed: 11/27/2020 11:58:27 AM By: Lorine Bears RCP, RRT, CHT Entered By: Lorine Bears on 11/27/2020 08:50:09

## 2020-11-30 ENCOUNTER — Ambulatory Visit: Payer: Self-pay

## 2020-11-30 ENCOUNTER — Other Ambulatory Visit: Payer: Self-pay

## 2020-11-30 ENCOUNTER — Encounter: Payer: Medicare HMO | Admitting: Physician Assistant

## 2020-11-30 DIAGNOSIS — M272 Inflammatory conditions of jaws: Secondary | ICD-10-CM | POA: Diagnosis not present

## 2020-11-30 NOTE — Progress Notes (Signed)
[place], [place] ([id number]) Visit Report for 11/30/2020 Arrival Information Details Patient Name: GADGE, HERMIZ A. Date of Service: 11/30/2020 8:00 AM Medical Record Number: [id number] Patient Account Number: [id number] Date of Birth/Sex: 1951-03-05 (70 y.o. M) Treating RN: Carlene Coria Primary Care Purl Claytor: Lavon Paganini Other Clinician: Jacqulyn Bath Referring Marque Rademaker: Lavon Paganini Treating Sixto Bowdish/Extender: Skipper Cliche in Treatment: 7 Visit Information History Since Last Visit Added or deleted any medications: No Patient Arrived: Ambulatory Any new allergies or adverse reactions: No Arrival Time: 08:05 Had a fall or experienced change in No Accompanied By: self activities of daily living that may affect Transfer Assistance: None risk of falls: Patient Identification Verified: Yes Signs or symptoms of abuse/neglect since last visito No Secondary Verification Process Completed: Yes Hospitalized since last visit: No Implantable device outside of the clinic excluding No cellular tissue based products placed in the center since last visit: Pain Present Now: No Electronic Signature(s) Signed: 11/30/2020 3:53:36 PM By: Lorine Bears RCP, RRT, CHT Entered By: Lorine Bears on 11/30/2020 09:02:58 [place], [place]. ([id number]) -------------------------------------------------------------------------------- Encounter Discharge Information Details Patient Name: Monday, Belinda A. Date of Service: 11/30/2020 8:00 AM Medical Record Number: [id number] Patient Account Number: [id number] Date of Birth/Sex: Dec 02, 1950 (70 y.o. M) Treating RN: Carlene Coria Primary Care Bora Bost: Lavon Paganini Other Clinician: Jacqulyn Bath Referring Bevin Mayall: Lavon Paganini Treating Masao Junker/Extender: Skipper Cliche in Treatment: 7 Encounter Discharge Information Items Discharge Condition: Stable Ambulatory Status: Ambulatory Discharge  Destination: Home Transportation: Private Auto Accompanied By: self Schedule Follow-up Appointment: Yes Clinical Summary of Care: Notes Patient has an HBO treatment scheduled on 12/01/20 at 08:00 am. Electronic Signature(s) Signed: 11/30/2020 3:53:36 PM By: Lorine Bears RCP, RRT, CHT Entered By: Lorine Bears on 11/30/2020 10:52:02 [place], [place] A. ([id number]) -------------------------------------------------------------------------------- Vitals Details Patient Name: Sapp, Edvin A. Date of Service: 11/30/2020 8:00 AM Medical Record Number: [id number] Patient Account Number: [id number] Date of Birth/Sex: Jan 12, 1951 (70 y.o. M) Treating RN: Carlene Coria Primary Care Aedyn Kempfer: Lavon Paganini Other Clinician: Jacqulyn Bath Referring Sayeed Weatherall: Lavon Paganini Treating Charniece Venturino/Extender: Skipper Cliche in Treatment: 7 Vital Signs Time Taken: 08:10 Temperature (F): 98.1 Height (in): 72 Pulse (bpm): 51 Weight (lbs): 240 Respiratory Rate (breaths/min): 18 Body Mass Index (BMI): 32.5 Blood Pressure (mmHg): 151/74 Reference Range: 80 - 120 mg / dl Electronic Signature(s) Signed: 11/30/2020 3:53:36 PM By: Lorine Bears RCP, RRT, CHT Entered By: Becky Sax, Amado Nash on 11/30/2020 09:03:42

## 2020-11-30 NOTE — Progress Notes (Signed)
[place], [place] ([id number]) Visit Report for 11/30/2020 HBO Details Patient Name: CROSSLAND, Rusell A. Date of Service: 11/30/2020 8:00 AM Medical Record Number: [id number] Patient Account Number: [id number] Date of Birth/Sex: 1951-07-10 (70 y.o. M) Treating RN: Carlene Coria Primary Care Kayla Deshaies: Lavon Paganini Other Clinician: Jacqulyn Bath Referring Mahiya Kercheval: Lavon Paganini Treating Gyasi Hazzard/Extender: Skipper Cliche in Treatment: 7 HBO Treatment Course Details Treatment Course Number: 1 Ordering Nikkol Pai: Jeri Cos Total Treatments Ordered: 30 HBO Treatment Start Date: 11/04/2020 HBO Indication: Osteoradionecrosis of Jaw HBO Treatment Details Treatment Number: 19 Patient Type: Outpatient Chamber Type: Monoplace Chamber Serial #: [id number] Treatment Protocol: 2.5 ATA with 90 minutes oxygen, with two 5 minute air breaks Treatment Details Compression Rate Down: 1.5 psi / minute De-Compression Rate Up: 2.5 psi / minute Compress Tx Pressure Air breaks and breathing periods Decompress Decompress Begins Reached (leave unused spaces blank) Begins Ends Chamber Pressure (ATA) 1 2.5 2.5 2.5 2.5 2.5 - - 2.5 1 Clock Time (24 hr) 08:21 08:36 09:06 09:11 09:41 09:47 - - 10:17 10:27 Treatment Length: 126 (minutes) Treatment Segments: 4 Vital Signs Capillary Blood Glucose Reference Range: 80 - 120 mg / dl HBO Diabetic Blood Glucose Intervention Range: <131 mg/dl or >249 mg/dl Time Vitals Blood Respiratory Capillary Blood Glucose Pulse Action Type: Pulse: Temperature: Taken: Pressure: Rate: Glucose (mg/dl): Meter #: Oximetry (%) Taken: Pre 08:10 151/74 51 18 98.1 Post 10:30 142/82 56 18 97.9 Treatment Response Treatment Toleration: Well Treatment Completion Treatment Completed without Adverse Event Status: Electronic Signature(s) Signed: 11/30/2020 3:53:36 PM By: Paulla Fore, RRT, CHT Signed: 11/30/2020 4:25:37 PM By: Worthy Keeler PA-C Entered By:  Lorine Bears on 11/30/2020 10:50:37 [place], [place] A. ([id number]) -------------------------------------------------------------------------------- HBO Safety Checklist Details Patient Name: Dominik, Ruben A. Date of Service: 11/30/2020 8:00 AM Medical Record Number: [id number] Patient Account Number: [id number] Date of Birth/Sex: [birth year] (70 y.o. M) Treating RN: Carlene Coria Primary Care Jamiel Goncalves: Lavon Paganini Other Clinician: Jacqulyn Bath Referring Priyanka Causey: Lavon Paganini Treating Ananiah Maciolek/Extender: Skipper Cliche in Treatment: 7 HBO Safety Checklist Items Safety Checklist Consent Form Signed Patient voided / foley secured and emptied When did you last eato 11/29/20 pm Last dose of injectable or oral agent n/a Ostomy pouch emptied and vented if applicable NA All implantable devices assessed, documented and approved NA Intravenous access site secured and place NA Valuables secured Linens and cotton and cotton/polyester blend (less than 51% polyester) Personal oil-based products / skin lotions / body lotions removed Wigs or hairpieces removed NA Smoking or tobacco materials removed NA Books / newspapers / magazines / loose paper removed NA Cologne, aftershave, perfume and deodorant removed Jewelry removed (may wrap wedding band) Make-up removed NA Hair care products removed Battery operated devices (external) removed NA Heating patches and chemical warmers removed NA Titanium eyewear removed NA Nail polish cured greater than 10 hours NA Casting material cured greater than 10 hours NA Hearing aids removed NA Loose dentures or partials removed NA Prosthetics have been removed NA Patient demonstrates correct use of air break device (if applicable) Patient concerns have been addressed Patient grounding bracelet on and cord attached to chamber Specifics for Inpatients (complete in addition to above) Medication sheet sent with  patient Intravenous medications needed or due during therapy sent with patient Drainage tubes (e.g. nasogastric tube or chest tube secured and vented) Endotracheal or Tracheotomy tube secured Cuff deflated of air and inflated with saline Airway suctioned Electronic Signature(s) Signed: 11/30/2020 3:53:36 PM By: Becky Sax, Sallie RCP, RRT, CHT Entered By:  Becky Sax, Sallie on 11/30/2020 09:04:54

## 2020-11-30 NOTE — Chronic Care Management (AMB) (Signed)
  Chronic Care Management   Outreach Note  11/30/2020 Name: Tyler [name] MRN: [id number] DOB: [birth year]  Primary Care Provider: Virginia Crews, MD Reason for referral : Chronic Care Management    Tyler [name] was referred to the care management team for assistance with chronic care management and care coordination. His primary care provider will be notified of our unsuccessful attempts to maintain contact. The care management team will gladly outreach at any time in the future if he is interested in receiving assistance.   PLAN  The care management team will gladly follow up with Tyler [name] after the primary care provider has a conversation with him regarding recommendation for care management engagement and subsequent re-referral for care management services.     [facility] [phone]

## 2020-12-01 ENCOUNTER — Encounter: Payer: Medicare HMO | Admitting: Physician Assistant

## 2020-12-01 DIAGNOSIS — M272 Inflammatory conditions of jaws: Secondary | ICD-10-CM | POA: Diagnosis not present

## 2020-12-01 NOTE — Progress Notes (Signed)
[place], [place] ([id number]) Visit Report for 12/01/2020 HBO Details Patient Name: Tyler [name], [name] Date of Service: 12/01/2020 8:00 AM Medical Record Number: [id number] Patient Account Number: [id number] Date of Birth/Sex: 07-02-1951 (70 y.o. M) Treating RN: Dolan Amen Primary Care Laythan Hayter: Lavon Paganini Other Clinician: Jacqulyn Bath Referring Tiombe Tomeo: Lavon Paganini Treating Elver Stadler/Extender: Skipper Cliche in Treatment: 7 HBO Treatment Course Details Treatment Course Number: 1 Ordering Camiah Humm: Jeri Cos Total Treatments Ordered: 30 HBO Treatment Start Date: 11/04/2020 HBO Indication: Osteoradionecrosis of Jaw HBO Treatment Details Treatment Number: 20 Patient Type: Outpatient Chamber Type: Monoplace Chamber Serial #: [id number] Treatment Protocol: 2.5 ATA with 90 minutes oxygen, with two 5 minute air breaks Treatment Details Compression Rate Down: 1.5 psi / minute De-Compression Rate Up: 2.5 psi / minute Compress Tx Pressure Air breaks and breathing periods Decompress Decompress Begins Reached (leave unused spaces blank) Begins Ends Chamber Pressure (ATA) 1 2.5 2.5 2.5 2.5 2.5 - - 2.5 1 Clock Time (24 hr) 08:13 08:28 08:58 09:03 09:34 09:39 - - 10:09 10:18 Treatment Length: 125 (minutes) Treatment Segments: 4 Vital Signs Capillary Blood Glucose Reference Range: 80 - 120 mg / dl HBO Diabetic Blood Glucose Intervention Range: <131 mg/dl or >249 mg/dl Time Vitals Blood Respiratory Capillary Blood Glucose Pulse Action Type: Pulse: Temperature: Taken: Pressure: Rate: Glucose (mg/dl): Meter #: Oximetry (%) Taken: Pre 08:05 158/72 49 18 98 Post 10:22 140/80 54 18 97.9 Treatment Response Treatment Toleration: Well Treatment Completion Treatment Completed without Adverse Event Status: Electronic Signature(s) Signed: 12/01/2020 11:16:38 AM By: Paulla Fore, RRT, CHT Signed: 12/01/2020 4:39:22 PM By: Worthy Keeler PA-C Entered By:  Lorine Bears on 12/01/2020 11:04:50 [place], [place] A. ([id number]) -------------------------------------------------------------------------------- HBO Safety Checklist Details Patient Name: [place], Desiderio A. Date of Service: 12/01/2020 8:00 AM Medical Record Number: [id number] Patient Account Number: [id number] Date of Birth/Sex: 18-Jan-1951 (70 y.o. M) Treating RN: Dolan Amen Primary Care Koleson Reifsteck: Lavon Paganini Other Clinician: Jacqulyn Bath Referring Alfonzia Woolum: Lavon Paganini Treating Arnecia Ector/Extender: Skipper Cliche in Treatment: 7 HBO Safety Checklist Items Safety Checklist Consent Form Signed Patient voided / foley secured and emptied When did you last eato 11/30/20 pm Last dose of injectable or oral agent n/a Ostomy pouch emptied and vented if applicable NA All implantable devices assessed, documented and approved NA Intravenous access site secured and place NA Valuables secured Linens and cotton and cotton/polyester blend (less than 51% polyester) Personal oil-based products / skin lotions / body lotions removed Wigs or hairpieces removed NA Smoking or tobacco materials removed NA Books / newspapers / magazines / loose paper removed NA Cologne, aftershave, perfume and deodorant removed Jewelry removed (may wrap wedding band) Make-up removed NA Hair care products removed Battery operated devices (external) removed NA Heating patches and chemical warmers removed NA Titanium eyewear removed NA Nail polish cured greater than 10 hours NA Casting material cured greater than 10 hours NA Hearing aids removed NA Loose dentures or partials removed NA Prosthetics have been removed NA Patient demonstrates correct use of air break device (if applicable) Patient concerns have been addressed Patient grounding bracelet on and cord attached to chamber Specifics for Inpatients (complete in addition to above) Medication sheet sent with  patient Intravenous medications needed or due during therapy sent with patient Drainage tubes (e.g. nasogastric tube or chest tube secured and vented) Endotracheal or Tracheotomy tube secured Cuff deflated of air and inflated with saline Airway suctioned Electronic Signature(s) Signed: 12/01/2020 11:16:38 AM By: Becky Sax, Sallie RCP, RRT, CHT Entered By:  Becky Sax, Sallie on 12/01/2020 08:51:18

## 2020-12-01 NOTE — Progress Notes (Signed)
[place], [place] ([id number]) Visit Report for 12/01/2020 Arrival Information Details Patient Name: Tyler [name], [name] Date of Service: 12/01/2020 8:00 AM Medical Record Number: [id number] Patient Account Number: [id number] Date of Birth/Sex: 04-21-1951 (70 y.o. M) Treating RN: Dolan Amen Primary Care Kieu Quiggle: Lavon Paganini Other Clinician: Jacqulyn Bath Referring Floetta Brickey: Lavon Paganini Treating Culley Hedeen/Extender: Skipper Cliche in Treatment: 7 Visit Information History Since Last Visit Added or deleted any medications: No Patient Arrived: Ambulatory Any new allergies or adverse reactions: No Arrival Time: 07:55 Had a fall or experienced change in No Accompanied By: self activities of daily living that may affect Transfer Assistance: None risk of falls: Patient Identification Verified: Yes Signs or symptoms of abuse/neglect since last visito No Secondary Verification Process Completed: Yes Hospitalized since last visit: No Implantable device outside of the clinic excluding No cellular tissue based products placed in the center since last visit: Pain Present Now: No Electronic Signature(s) Signed: 12/01/2020 11:16:38 AM By: Lorine Bears RCP, RRT, CHT Entered By: Lorine Bears on 12/01/2020 08:48:20 [place], [place]. ([id number]) -------------------------------------------------------------------------------- Encounter Discharge Information Details Patient Name: Tyler [name], [name] Date of Service: 12/01/2020 8:00 AM Medical Record Number: [id number] Patient Account Number: [id number] Date of Birth/Sex: [birth year] (70 y.o. M) Treating RN: Dolan Amen Primary Care Hasana Alcorta: Lavon Paganini Other Clinician: Jacqulyn Bath Referring Jonty Morrical: Lavon Paganini Treating Daqwan Dougal/Extender: Skipper Cliche in Treatment: 7 Encounter Discharge Information Items Discharge Condition: Stable Ambulatory Status: Ambulatory Discharge  Destination: Home Transportation: Private Auto Accompanied By: self Schedule Follow-up Appointment: Yes Clinical Summary of Care: Notes Patient is scheduled for oral surgery on 12/07/20 and is scheduled to return for HBO on 12/08/20 at 08:00 am. Electronic Signature(s) Signed: 12/01/2020 11:16:38 AM By: Lorine Bears RCP, RRT, CHT Entered By: Lorine Bears on 12/01/2020 11:09:20 [place], [place]. ([id number]) -------------------------------------------------------------------------------- Vitals Details Patient Name: Tyler Pu A. Date of Service: 12/01/2020 8:00 AM Medical Record Number: [id number] Patient Account Number: [id number] Date of Birth/Sex: 10-09-1950 (70 y.o. M) Treating RN: Dolan Amen Primary Care Audelia Knape: Lavon Paganini Other Clinician: Jacqulyn Bath Referring Darlisa Spruiell: Lavon Paganini Treating Katrinia Straker/Extender: Skipper Cliche in Treatment: 7 Vital Signs Time Taken: 08:05 Temperature (F): 98.0 Height (in): 72 Pulse (bpm): 49 Weight (lbs): 240 Respiratory Rate (breaths/min): 18 Body Mass Index (BMI): 32.5 Blood Pressure (mmHg): 158/72 Reference Range: 80 - 120 mg / dl Electronic Signature(s) Signed: 12/01/2020 11:16:38 AM By: Lorine Bears RCP, RRT, CHT Entered By: Becky Sax, Amado Nash on 12/01/2020 08:48:51

## 2020-12-07 DIAGNOSIS — M8618 Other acute osteomyelitis, other site: Secondary | ICD-10-CM | POA: Diagnosis not present

## 2020-12-08 ENCOUNTER — Encounter: Payer: Medicare HMO | Admitting: Physician Assistant

## 2020-12-08 ENCOUNTER — Other Ambulatory Visit: Payer: Self-pay

## 2020-12-08 DIAGNOSIS — M272 Inflammatory conditions of jaws: Secondary | ICD-10-CM | POA: Diagnosis not present

## 2020-12-08 NOTE — Progress Notes (Signed)
[place], [place] ([id number]) Visit Report for 12/08/2020 Arrival Information Details Patient Name: Tyler [name], [name] Date of Service: 12/08/2020 8:00 AM Medical Record Number: [id number] Patient Account Number: [id number] Date of Birth/Sex: 07-17-1951 (70 y.o. M) Treating RN: Dolan Amen Primary Care Bridney Guadarrama: Lavon Paganini Other Clinician: Jacqulyn Bath Referring Lillis Nuttle: Lavon Paganini Treating Lason Eveland/Extender: Skipper Cliche in Treatment: 8 Visit Information History Since Last Visit Added or deleted any medications: No Patient Arrived: Ambulatory Any new allergies or adverse reactions: No Arrival Time: 08:00 Had a fall or experienced change in No Accompanied By: self activities of daily living that may affect Transfer Assistance: None risk of falls: Patient Identification Verified: Yes Signs or symptoms of abuse/neglect since last visito No Secondary Verification Process Completed: Yes Hospitalized since last visit: No Implantable device outside of the clinic excluding No cellular tissue based products placed in the center since last visit: Pain Present Now: No Electronic Signature(s) Signed: 12/08/2020 12:44:23 PM By: Lorine Bears RCP, RRT, CHT Entered By: Lorine Bears on 12/08/2020 08:21:49 Tyler [name], [name] ([id number]) -------------------------------------------------------------------------------- Encounter Discharge Information Details Patient Name: Tyler [name], [name] Date of Service: 12/08/2020 8:00 AM Medical Record Number: [id number] Patient Account Number: [id number] Date of Birth/Sex: Aug 02, 1951 (70 y.o. M) Treating RN: Dolan Amen Primary Care Yesha Muchow: Lavon Paganini Other Clinician: Jacqulyn Bath Referring Jamariyah Johannsen: Lavon Paganini Treating Jordanny Waddington/Extender: Skipper Cliche in Treatment: 8 Encounter Discharge Information Items Discharge Condition: Stable Ambulatory Status: Ambulatory Discharge  Destination: Home Transportation: Private Auto Accompanied By: self Schedule Follow-up Appointment: Yes Clinical Summary of Care: Notes Patient has an HBO treatment scheduled on 12/09/20 at 08:00 am. Electronic Signature(s) Signed: 12/08/2020 12:44:23 PM By: Lorine Bears RCP, RRT, CHT Entered By: Lorine Bears on 12/08/2020 11:03:07 Tyler [name], [name] ([id number]) -------------------------------------------------------------------------------- Vitals Details Patient Name: Tyler [name], [name] Date of Service: 12/08/2020 8:00 AM Medical Record Number: [id number] Patient Account Number: [id number] Date of Birth/Sex: 05/16/1951 (70 y.o. M) Treating RN: Dolan Amen Primary Care Joscelin Fray: Lavon Paganini Other Clinician: Jacqulyn Bath Referring Jeraline Marcinek: Lavon Paganini Treating Choua Chalker/Extender: Skipper Cliche in Treatment: 8 Vital Signs Time Taken: 08:05 Temperature (F): 97.9 Height (in): 72 Pulse (bpm): 53 Weight (lbs): 240 Respiratory Rate (breaths/min): 18 Body Mass Index (BMI): 32.5 Blood Pressure (mmHg): 175/70 Reference Range: 80 - 120 mg / dl Electronic Signature(s) Signed: 12/08/2020 12:44:23 PM By: Lorine Bears RCP, RRT, CHT Entered By: Lorine Bears on 12/08/2020 08:22:34

## 2020-12-09 ENCOUNTER — Encounter: Payer: Medicare HMO | Admitting: Internal Medicine

## 2020-12-09 DIAGNOSIS — M272 Inflammatory conditions of jaws: Secondary | ICD-10-CM | POA: Diagnosis not present

## 2020-12-09 NOTE — Progress Notes (Signed)
[place], [place] ([id number]) Visit Report for 12/09/2020 HBO Details Patient Name: [name], [name] Date of Service: 12/09/2020 8:00 AM Medical Record Number: [id number] Patient Account Number: [id number] Date of Birth/Sex: Oct 14, 1950 (70 y.o. M) Treating RN: Tyler [name] Primary Care Tyler [name]: Tyler [name] Other Clinician: Jacqulyn [name] Referring Tyler [name]: Tyler [name] Treating Tyler [name]/Extender: Tyler [name] in Treatment: 8 HBO Treatment Course Details Treatment Course Number: 1 Ordering Tyler [name]: Tyler [name] Total Treatments Ordered: 30 HBO Treatment Start Date: 11/04/2020 HBO Indication: Osteoradionecrosis of Jaw HBO Treatment Details Treatment Number: 22 Patient Type: Outpatient Chamber Type: Monoplace Chamber Serial #: [id number] Treatment Protocol: 2.5 ATA with 90 minutes oxygen, with two 5 minute air breaks Treatment Details Compression Rate Down: 1.5 psi / minute De-Compression Rate Up: 1.5 psi / minute Compress Tx Pressure Air breaks and breathing periods Decompress Decompress Begins Reached (leave unused spaces blank) Begins Ends Chamber Pressure (ATA) 1 2.5 2.5 2.5 2.5 2.5 - - 2.5 1 Clock Time (24 hr) 08:14 08:29 09:00 09:06 09:36 09:41 - - 10:12 10:25 Treatment Length: 131 (minutes) Treatment Segments: 4 Vital Signs Capillary Blood Glucose Reference Range: 80 - 120 mg / dl HBO Diabetic Blood Glucose Intervention Range: <131 mg/dl or >249 mg/dl Time Vitals Blood Respiratory Capillary Blood Glucose Pulse Action Type: Pulse: Temperature: Taken: Pressure: Rate: Glucose (mg/dl): Meter #: Oximetry (%) Taken: Pre 08:00 159/86 55 18 98.1 Post 10:28 138/72 30 18 98.2 Treatment Response Treatment Toleration: Well Treatment Completion Treatment Completed without Adverse Event Status: Tyler [name] Notes No concerns with treatment given HBO Attestation I certify that I supervised this HBO treatment in accordance with Medicare guidelines. A trained  emergency response team is readily Yes available per hospital policies and procedures. Continue HBOT as ordered. Yes Electronic Signature(s) Signed: 12/09/2020 5:15:48 PM By: Tyler Ham MD Previous Signature: 12/09/2020 10:57:57 AM Version By: Tyler [name] Tyler [name] Entered By: Tyler [name] on 12/09/2020 17:12:56 [name], [place] A. ([id number]) -------------------------------------------------------------------------------- HBO Safety Checklist Details Patient Name: [name], [name] Date of Service: 12/09/2020 8:00 AM Medical Record Number: [id number] Patient Account Number: [id number] Date of Birth/Sex: [birth year] (70 y.o. M) Treating RN: Tyler [name] Primary Care Island Dohmen: Tyler [name] Other Clinician: Jacqulyn [name] Referring Channie Bostick: Tyler [name] Treating Hildur Bayer/Extender: Tyler [name] in Treatment: 8 HBO Safety Checklist Items Safety Checklist Consent Form Signed Patient voided / foley secured and emptied When did you last eato 12/08/20 pm Last dose of injectable or oral agent n/a Ostomy pouch emptied and vented if applicable NA All implantable devices assessed, documented and approved NA Intravenous access site secured and place NA Valuables secured Linens and cotton and cotton/polyester blend (less than 51% polyester) Personal oil-based products / skin lotions / body lotions removed Wigs or hairpieces removed NA Smoking or tobacco materials removed NA Books / newspapers / magazines / loose paper removed NA Cologne, aftershave, perfume and deodorant removed Jewelry removed (may wrap wedding band) Make-up removed NA Hair care products removed Battery operated devices (external) removed NA Heating patches and chemical warmers removed NA Titanium eyewear removed NA Nail polish cured greater than 10 hours NA Casting material cured greater than 10 hours NA Hearing aids removed NA Loose dentures or partials  removed NA Prosthetics have been removed NA Patient demonstrates correct use of air break device (if applicable) Patient concerns have been addressed Patient grounding bracelet on and cord attached to chamber Specifics for Inpatients (complete in addition to above) Medication sheet sent with patient Intravenous medications needed or due during therapy sent  with patient Drainage tubes (e.g. nasogastric tube or chest tube secured and vented) Endotracheal or Tracheotomy tube secured Cuff deflated of air and inflated with saline Airway suctioned Electronic Signature(s) Signed: 12/09/2020 10:57:57 AM By: Tyler [name] Tyler [name] Entered By: Tyler [name] on 12/09/2020 08:52:43

## 2020-12-09 NOTE — Progress Notes (Signed)
[place], [place] ([id number]) Visit Report for 12/09/2020 Arrival Information Details Patient Name: Tyler [name], [name] Date of Service: 12/09/2020 8:00 AM Medical Record Number: [id number] Patient Account Number: [id number] Date of Birth/Sex: 02-11-1951 (70 y.o. M) Treating RN: Cornell Barman Primary Care Eulogio Requena: Lavon Paganini Other Clinician: Jacqulyn Bath Referring Arkie Tagliaferro: Lavon Paganini Treating Tamecca Artiga/Extender: Tito Dine in Treatment: 8 Visit Information History Since Last Visit Added or deleted any medications: No Patient Arrived: Ambulatory Any new allergies or adverse reactions: No Arrival Time: 08:00 Had a fall or experienced change in No Accompanied By: self activities of daily living that may affect Transfer Assistance: None risk of falls: Patient Identification Verified: Yes Signs or symptoms of abuse/neglect since last visito No Secondary Verification Process Completed: Yes Hospitalized since last visit: No Implantable device outside of the clinic excluding No cellular tissue based products placed in the center since last visit: Pain Present Now: No Electronic Signature(s) Signed: 12/09/2020 10:57:57 AM By: Lorine Bears RCP, RRT, CHT Entered By: Lorine Bears on 12/09/2020 08:50:53 Tyler [name], [name] ([id number]) -------------------------------------------------------------------------------- Encounter Discharge Information Details Patient Name: Tyler [name], [name] Date of Service: 12/09/2020 8:00 AM Medical Record Number: [id number] Patient Account Number: [id number] Date of Birth/Sex: 12/10/1950 (70 y.o. M) Treating RN: Cornell Barman Primary Care Albeiro Trompeter: Lavon Paganini Other Clinician: Jacqulyn Bath Referring Scarlettrose Costilow: Lavon Paganini Treating Noriel Guthrie/Extender: Tito Dine in Treatment: 8 Encounter Discharge Information Items Discharge Condition: Stable Ambulatory Status: Ambulatory Discharge  Destination: Home Transportation: Private Auto Accompanied By: self Schedule Follow-up Appointment: Yes Clinical Summary of Care: Notes Patient has an HBO treatment scheduled on 12/11/31 at 08:00 am. Electronic Signature(s) Signed: 12/09/2020 10:57:57 AM By: Lorine Bears RCP, RRT, CHT Entered By: Lorine Bears on 12/09/2020 10:57:20 Tyler [name], [name] ([id number]) -------------------------------------------------------------------------------- Vitals Details Patient Name: Tyler [name], [name] Date of Service: 12/09/2020 8:00 AM Medical Record Number: [id number] Patient Account Number: [id number] Date of Birth/Sex: 1950-11-16 (70 y.o. M) Treating RN: Cornell Barman Primary Care Hemi Chacko: Lavon Paganini Other Clinician: Jacqulyn Bath Referring Brahm Barbeau: Lavon Paganini Treating Gusta Marksberry/Extender: Tito Dine in Treatment: 8 Vital Signs Time Taken: 08:00 Temperature (F): 98.1 Height (in): 72 Pulse (bpm): 55 Weight (lbs): 240 Respiratory Rate (breaths/min): 18 Body Mass Index (BMI): 32.5 Blood Pressure (mmHg): 159/86 Reference Range: 80 - 120 mg / dl Electronic Signature(s) Signed: 12/09/2020 10:57:57 AM By: Lorine Bears RCP, RRT, CHT Entered By: Lorine Bears on 12/09/2020 08:51:29

## 2020-12-09 NOTE — Progress Notes (Signed)
[place], [place] ([id number]) Visit Report for 12/08/2020 HBO Details Patient Name: Tyler [name], [name] Date of Service: 12/08/2020 8:00 AM Medical Record Number: [id number] Patient Account Number: [id number] Date of Birth/Sex: 1950/12/26 (70 y.o. M) Treating RN: Dolan Amen Primary Care Loreta Blouch: Lavon Paganini Other Clinician: Jacqulyn Bath Referring Kiasia Chou: Lavon Paganini Treating Preslie Depasquale/Extender: Skipper Cliche in Treatment: 8 HBO Treatment Course Details Treatment Course Number: 1 Ordering Shanielle Correll: Jeri Cos Total Treatments Ordered: 30 HBO Treatment Start Date: 11/04/2020 HBO Indication: Osteoradionecrosis of Jaw HBO Treatment Details Treatment Number: 21 Patient Type: Outpatient Chamber Type: Monoplace Chamber Serial #: [id number] Treatment Protocol: 2.5 ATA with 90 minutes oxygen, with two 5 minute air breaks Treatment Details Compression Rate Down: 1.5 psi / minute De-Compression Rate Up: 2.5 psi / minute Compress Tx Pressure Air breaks and breathing periods Decompress Decompress Begins Reached (leave unused spaces blank) Begins Ends Chamber Pressure (ATA) 1 2.5 2.5 2.5 2.5 2.5 - - 2.5 1 Clock Time (24 hr) 08:15 08:30 09:00 09:05 09:36 09:41 - - 10:11 10:20 Treatment Length: 125 (minutes) Treatment Segments: 4 Vital Signs Capillary Blood Glucose Reference Range: 80 - 120 mg / dl HBO Diabetic Blood Glucose Intervention Range: <131 mg/dl or >249 mg/dl Time Vitals Blood Respiratory Capillary Blood Glucose Pulse Action Type: Pulse: Temperature: Taken: Pressure: Rate: Glucose (mg/dl): Meter #: Oximetry (%) Taken: Pre 08:05 175/70 53 18 97.9 Post 10:20 160/72 18 48 98 Treatment Response Treatment Toleration: Well Treatment Completion Treatment Completed without Adverse Event Status: Electronic Signature(s) Signed: 12/08/2020 12:44:23 PM By: Paulla Fore, RRT, CHT Signed: 12/08/2020 7:06:18 PM By: Worthy Keeler PA-C Entered By:  Lorine Bears on 12/08/2020 11:43:42 Tyler [name], [name] ([id number]) -------------------------------------------------------------------------------- HBO Safety Checklist Details Patient Name: Tyler [name], [name] Date of Service: 12/08/2020 8:00 AM Medical Record Number: [id number] Patient Account Number: [id number] Date of Birth/Sex: 03-08-1951 (70 y.o. M) Treating RN: Dolan Amen Primary Care Barron Vanloan: Lavon Paganini Other Clinician: Jacqulyn Bath Referring Joseline Mccampbell: Lavon Paganini Treating Malisha Mabey/Extender: Skipper Cliche in Treatment: 8 HBO Safety Checklist Items Safety Checklist Consent Form Signed Patient voided / foley secured and emptied When did you last eato 12/06/20 pm Last dose of injectable or oral agent n/a Ostomy pouch emptied and vented if applicable NA All implantable devices assessed, documented and approved NA Intravenous access site secured and place NA Valuables secured Linens and cotton and cotton/polyester blend (less than 51% polyester) Personal oil-based products / skin lotions / body lotions removed Wigs or hairpieces removed NA Smoking or tobacco materials removed NA Books / newspapers / magazines / loose paper removed NA Cologne, aftershave, perfume and deodorant removed Jewelry removed (may wrap wedding band) Make-up removed NA Hair care products removed Battery operated devices (external) removed NA Heating patches and chemical warmers removed NA Titanium eyewear removed NA Nail polish cured greater than 10 hours NA Casting material cured greater than 10 hours NA Hearing aids removed NA Loose dentures or partials removed NA Prosthetics have been removed NA Patient demonstrates correct use of air break device (if applicable) Patient concerns have been addressed Patient grounding bracelet on and cord attached to chamber Specifics for Inpatients (complete in addition to above) Medication sheet sent with  patient Intravenous medications needed or due during therapy sent with patient Drainage tubes (e.g. nasogastric tube or chest tube secured and vented) Endotracheal or Tracheotomy tube secured Cuff deflated of air and inflated with saline Airway suctioned Electronic Signature(s) Signed: 12/08/2020 12:44:23 PM By: Becky Sax, Sallie RCP, RRT, CHT Entered By:  Becky Sax, Amado Nash on 12/08/2020 08:23:44

## 2020-12-10 ENCOUNTER — Other Ambulatory Visit: Payer: Self-pay

## 2020-12-10 ENCOUNTER — Encounter: Payer: Medicare HMO | Admitting: Physician Assistant

## 2020-12-10 DIAGNOSIS — M272 Inflammatory conditions of jaws: Secondary | ICD-10-CM | POA: Diagnosis not present

## 2020-12-10 NOTE — Progress Notes (Signed)
[place], [place] ([id number]) Visit Report for 12/10/2020 Arrival Information Details Patient Name: SHAHID, FLORI A. Date of Service: 12/10/2020 8:00 AM Medical Record Number: [id number] Patient Account Number: [id number] Date of Birth/Sex: 03-08-1951 (70 y.o. M) Treating RN: Dolan Amen Primary Care Torrian Canion: Lavon Paganini Other Clinician: Jacqulyn Bath Referring Maryam Feely: Lavon Paganini Treating Keysi Oelkers/Extender: Skipper Cliche in Treatment: 9 Visit Information History Since Last Visit Added or deleted any medications: No Patient Arrived: Ambulatory Any new allergies or adverse reactions: No Arrival Time: 07:55 Had a fall or experienced change in No Accompanied By: self activities of daily living that may affect Transfer Assistance: None risk of falls: Patient Identification Verified: Yes Signs or symptoms of abuse/neglect since last visito No Secondary Verification Process Completed: Yes Hospitalized since last visit: No Implantable device outside of the clinic excluding No cellular tissue based products placed in the center since last visit: Pain Present Now: No Electronic Signature(s) Signed: 12/10/2020 2:42:59 PM By: Lorine Bears RCP, RRT, CHT Entered By: Lorine Bears on 12/10/2020 08:35:38 [place], [place] A. ([id number]) -------------------------------------------------------------------------------- Encounter Discharge Information Details Patient Name: Montalto, David A. Date of Service: 12/10/2020 8:00 AM Medical Record Number: [id number] Patient Account Number: [id number] Date of Birth/Sex: October 19, 1950 (69 y.o. M) Treating RN: Dolan Amen Primary Care Shaquanda Graves: Lavon Paganini Other Clinician: Jacqulyn Bath Referring Ivee Poellnitz: Lavon Paganini Treating Garlen Reinig/Extender: Skipper Cliche in Treatment: 9 Encounter Discharge Information Items Discharge Condition: Stable Ambulatory Status: Ambulatory Discharge  Destination: Home Transportation: Private Auto Accompanied By: self Schedule Follow-up Appointment: Yes Clinical Summary of Care: Notes Patient has an HBO treatment scheduled on 12/11/20 at 08:00 am. Electronic Signature(s) Signed: 12/10/2020 2:42:59 PM By: Lorine Bears RCP, RRT, CHT Entered By: Lorine Bears on 12/10/2020 10:53:51 [place], [place] A. ([id number]) -------------------------------------------------------------------------------- Vitals Details Patient Name: Kath, Demetre A. Date of Service: 12/10/2020 8:00 AM Medical Record Number: [id number] Patient Account Number: [id number] Date of Birth/Sex: 07-Mar-1951 (70 y.o. M) Treating RN: Dolan Amen Primary Care Reign Bartnick: Lavon Paganini Other Clinician: Jacqulyn Bath Referring Aury Scollard: Lavon Paganini Treating Chibuikem Thang/Extender: Skipper Cliche in Treatment: 9 Vital Signs Time Taken: 08:05 Temperature (F): 98.1 Height (in): 72 Pulse (bpm): 56 Weight (lbs): 240 Respiratory Rate (breaths/min): 18 Body Mass Index (BMI): 32.5 Blood Pressure (mmHg): 141/79 Reference Range: 80 - 120 mg / dl Electronic Signature(s) Signed: 12/10/2020 2:42:59 PM By: Lorine Bears RCP, RRT, CHT Entered By: Lorine Bears on 12/10/2020 08:36:06

## 2020-12-11 ENCOUNTER — Encounter: Payer: Medicare HMO | Admitting: Physician Assistant

## 2020-12-11 DIAGNOSIS — M272 Inflammatory conditions of jaws: Secondary | ICD-10-CM | POA: Diagnosis not present

## 2020-12-11 NOTE — Progress Notes (Signed)
[place], [place] ([id number]) Visit Report for 12/11/2020 Arrival Information Details Patient Name: Tyler [name], [name] Date of Service: 12/11/2020 8:00 AM Medical Record Number: [id number] Patient Account Number: [id number] Date of Birth/Sex: [birth year] (70 y.o. M) Treating RN: Carlene Coria Primary Care Gelene Recktenwald: Lavon Paganini Other Clinician: Jacqulyn Bath Referring Porfiria Heinrich: Lavon Paganini Treating Hillman Attig/Extender: Skipper Cliche in Treatment: 9 Visit Information History Since Last Visit Added or deleted any medications: No Patient Arrived: Ambulatory Any new allergies or adverse reactions: No Arrival Time: 07:55 Had a fall or experienced change in No Accompanied By: self activities of daily living that may affect Transfer Assistance: None risk of falls: Patient Identification Verified: Yes Signs or symptoms of abuse/neglect since last visito No Secondary Verification Process Completed: Yes Hospitalized since last visit: No Implantable device outside of the clinic excluding No cellular tissue based products placed in the center since last visit: Pain Present Now: No Electronic Signature(s) Signed: 12/11/2020 10:46:59 AM By: Lorine Bears RCP, RRT, CHT Entered By: Lorine Bears on 12/11/2020 09:06:19 [place], [place]. ([id number]) -------------------------------------------------------------------------------- Encounter Discharge Information Details Patient Name: Tyler [name], [name] Date of Service: 12/11/2020 8:00 AM Medical Record Number: [id number] Patient Account Number: [id number] Date of Birth/Sex: August 07, 1951 (71 y.o. M) Treating RN: Carlene Coria Primary Care Tarin Johndrow: Lavon Paganini Other Clinician: Jacqulyn Bath Referring Connelly [place]: Lavon Paganini Treating Ilze Roselli/Extender: Skipper Cliche in Treatment: 9 Encounter Discharge Information Items Discharge Condition: Stable Ambulatory Status: Ambulatory Discharge  Destination: Home Transportation: Private Auto Accompanied By: self Schedule Follow-up Appointment: Yes Clinical Summary of Care: Notes Patient has an HBO treatment scheduled on 12/14/20 at 08:00 am. Electronic Signature(s) Signed: 12/11/2020 10:46:59 AM By: Lorine Bears RCP, RRT, CHT Entered By: Lorine Bears on 12/11/2020 10:46:39 [place], [place] A. ([id number]) -------------------------------------------------------------------------------- Vitals Details Patient Name: Tyler [name], [name] Date of Service: 12/11/2020 8:00 AM Medical Record Number: [id number] Patient Account Number: [id number] Date of Birth/Sex: 03-15-1951 (70 y.o. M) Treating RN: Carlene Coria Primary Care Soliyana Mcchristian: Lavon Paganini Other Clinician: Jacqulyn Bath Referring Corion Sherrod: Lavon Paganini Treating Rafaelita Foister/Extender: Skipper Cliche in Treatment: 9 Vital Signs Time Taken: 08:05 Temperature (F): 98.0 Height (in): 72 Pulse (bpm): 49 Weight (lbs): 240 Respiratory Rate (breaths/min): 18 Body Mass Index (BMI): 32.5 Blood Pressure (mmHg): 151/74 Reference Range: 80 - 120 mg / dl Electronic Signature(s) Signed: 12/11/2020 10:46:59 AM By: Lorine Bears RCP, RRT, CHT Entered By: Lorine Bears on 12/11/2020 09:07:07

## 2020-12-11 NOTE — Progress Notes (Signed)
[place], [place] ([id number]) Visit Report for 12/11/2020 HBO Details Patient Name: Tyler [name], [name] Date of Service: 12/11/2020 8:00 AM Medical Record Number: [id number] Patient Account Number: [id number] Date of Birth/Sex: [birth year] (70 y.o. M) Treating RN: Carlene Coria Primary Care Verdie Barrows: Lavon Paganini Other Clinician: Jacqulyn Bath Referring Emiliano Welshans: Lavon Paganini Treating Tyreece Gelles/Extender: Skipper Cliche in Treatment: 9 HBO Treatment Course Details Treatment Course Number: 1 Ordering Jquan Egelston: Jeri Cos Total Treatments Ordered: 30 HBO Treatment Start Date: 11/04/2020 HBO Indication: Osteoradionecrosis of Jaw HBO Treatment Details Treatment Number: 24 Patient Type: Outpatient Chamber Type: Monoplace Chamber Serial #: [id number] Treatment Protocol: 2.5 ATA with 90 minutes oxygen, with two 5 minute air breaks Treatment Details Compression Rate Down: 1.5 psi / minute De-Compression Rate Up: 1.5 psi / minute Compress Tx Pressure Air breaks and breathing periods Decompress Decompress Begins Reached (leave unused spaces blank) Begins Ends Chamber Pressure (ATA) 1 2.5 2.5 2.5 2.5 2.5 - - 2.5 1 Clock Time (24 hr) 08:19 08:34 09:04 09:09 09:40 09:45 - - 10:15 10:30 Treatment Length: 131 (minutes) Treatment Segments: 4 Vital Signs Capillary Blood Glucose Reference Range: 80 - 120 mg / dl HBO Diabetic Blood Glucose Intervention Range: <131 mg/dl or >249 mg/dl Time Vitals Blood Respiratory Capillary Blood Glucose Pulse Action Type: Pulse: Temperature: Taken: Pressure: Rate: Glucose (mg/dl): Meter #: Oximetry (%) Taken: Pre 08:05 151/74 49 18 98 Post 10:33 140/80 54 18 97.9 Treatment Response Treatment Toleration: Well Treatment Completion Treatment Completed without Adverse Event Status: Electronic Signature(s) Signed: 12/11/2020 10:46:59 AM By: Lorine Bears RCP, RRT, CHT Signed: 12/11/2020 2:05:44 PM By: Worthy Keeler PA-C Entered By:  Lorine Bears on 12/11/2020 10:45:52 Tyler [name], [name] ([id number]) -------------------------------------------------------------------------------- HBO Safety Checklist Details Patient Name: Tomasetti, Elbert A. Date of Service: 12/11/2020 8:00 AM Medical Record Number: [id number] Patient Account Number: [id number] Date of Birth/Sex: Feb 08, 1951 (70 y.o. M) Treating RN: Carlene Coria Primary Care Emmagene Ortner: Lavon Paganini Other Clinician: Jacqulyn Bath Referring Mialani Reicks: Lavon Paganini Treating Harmoni Lucus/Extender: Skipper Cliche in Treatment: 9 HBO Safety Checklist Items Safety Checklist Consent Form Signed Patient voided / foley secured and emptied When did you last eato 12/10/20 pm Last dose of injectable or oral agent n/a Ostomy pouch emptied and vented if applicable NA All implantable devices assessed, documented and approved NA Intravenous access site secured and place NA Valuables secured Linens and cotton and cotton/polyester blend (less than 51% polyester) Personal oil-based products / skin lotions / body lotions removed Wigs or hairpieces removed NA Smoking or tobacco materials removed NA Books / newspapers / magazines / loose paper removed NA Cologne, aftershave, perfume and deodorant removed Jewelry removed (may wrap wedding band) Make-up removed NA Hair care products removed Battery operated devices (external) removed NA Heating patches and chemical warmers removed NA Titanium eyewear removed NA Nail polish cured greater than 10 hours NA Casting material cured greater than 10 hours NA Hearing aids removed NA Loose dentures or partials removed NA Prosthetics have been removed NA Patient demonstrates correct use of air break device (if applicable) Patient concerns have been addressed Patient grounding bracelet on and cord attached to chamber Specifics for Inpatients (complete in addition to above) Medication sheet sent with  patient Intravenous medications needed or due during therapy sent with patient Drainage tubes (e.g. nasogastric tube or chest tube secured and vented) Endotracheal or Tracheotomy tube secured Cuff deflated of air and inflated with saline Airway suctioned Electronic Signature(s) Signed: 12/11/2020 10:46:59 AM By: Becky Sax, Sallie RCP, RRT, CHT Entered By:  Becky Sax, Amado Nash on 12/11/2020 09:08:46

## 2020-12-11 NOTE — Progress Notes (Signed)
[place], [place] ([id number]) Visit Report for 12/10/2020 HBO Details Patient Name: Tyler [name], [name] Date of Service: 12/10/2020 8:00 AM Medical Record Number: [id number] Patient Account Number: [id number] Date of Birth/Sex: May 22, 1951 (70 y.o. M) Treating RN: Dolan Amen Primary Care Kariana Wiles: Lavon Paganini Other Clinician: Jacqulyn Bath Referring Troyce Febo: Lavon Paganini Treating Carlyn Mullenbach/Extender: Skipper Cliche in Treatment: 9 HBO Treatment Course Details Treatment Course Number: 1 Ordering Amandalee Lacap: Jeri Cos Total Treatments Ordered: 30 HBO Treatment Start Date: 11/04/2020 HBO Indication: Osteoradionecrosis of Jaw HBO Treatment Details Treatment Number: 23 Patient Type: Outpatient Chamber Type: Monoplace Chamber Serial #: [id number] Treatment Protocol: 2.5 ATA with 90 minutes oxygen, with two 5 minute air breaks Treatment Details Compression Rate Down: 1.5 psi / minute De-Compression Rate Up: 1.5 psi / minute Compress Tx Pressure Air breaks and breathing periods Decompress Decompress Begins Reached (leave unused spaces blank) Begins Ends Chamber Pressure (ATA) 1 2.5 2.5 2.5 2.5 2.5 - - 2.5 1 Clock Time (24 hr) 08:16 08:30 09:01 09:06 09:36 09:41 - - 10:12 10:27 Treatment Length: 131 (minutes) Treatment Segments: 4 Vital Signs Capillary Blood Glucose Reference Range: 80 - 120 mg / dl HBO Diabetic Blood Glucose Intervention Range: <131 mg/dl or >249 mg/dl Time Vitals Blood Respiratory Capillary Blood Glucose Pulse Action Type: Pulse: Temperature: Taken: Pressure: Rate: Glucose (mg/dl): Meter #: Oximetry (%) Taken: Pre 08:05 141/79 56 18 98.1 Post 10:30 142/84 72 18 98.3 Treatment Response Treatment Toleration: Well Treatment Completion Treatment Completed without Adverse Event Status: Electronic Signature(s) Signed: 12/10/2020 2:42:59 PM By: Paulla Fore, RRT, CHT Signed: 12/10/2020 6:00:52 PM By: Worthy Keeler PA-C Entered By:  Lorine Bears on 12/10/2020 10:52:54 Tyler [name], [name] ([id number]) -------------------------------------------------------------------------------- HBO Safety Checklist Details Patient Name: Tyler [name], [name] Date of Service: 12/10/2020 8:00 AM Medical Record Number: [id number] Patient Account Number: [id number] Date of Birth/Sex: Oct 30, 1950 (70 y.o. M) Treating RN: Dolan Amen Primary Care Lanice Folden: Lavon Paganini Other Clinician: Jacqulyn Bath Referring Emmeline Winebarger: Lavon Paganini Treating Gianah Batt/Extender: Skipper Cliche in Treatment: 9 HBO Safety Checklist Items Safety Checklist Consent Form Signed Patient voided / foley secured and emptied When did you last eato 12/09/20 pm Last dose of injectable or oral agent n/a Ostomy pouch emptied and vented if applicable NA All implantable devices assessed, documented and approved NA Intravenous access site secured and place NA Valuables secured Linens and cotton and cotton/polyester blend (less than 51% polyester) Personal oil-based products / skin lotions / body lotions removed Wigs or hairpieces removed NA Smoking or tobacco materials removed NA Books / newspapers / magazines / loose paper removed NA Cologne, aftershave, perfume and deodorant removed Jewelry removed (may wrap wedding band) Make-up removed NA Hair care products removed Battery operated devices (external) removed NA Heating patches and chemical warmers removed NA Titanium eyewear removed NA Nail polish cured greater than 10 hours NA Casting material cured greater than 10 hours NA Hearing aids removed NA Loose dentures or partials removed NA Prosthetics have been removed NA Patient demonstrates correct use of air break device (if applicable) Patient concerns have been addressed Patient grounding bracelet on and cord attached to chamber Specifics for Inpatients (complete in addition to above) Medication sheet sent with  patient Intravenous medications needed or due during therapy sent with patient Drainage tubes (e.g. nasogastric tube or chest tube secured and vented) Endotracheal or Tracheotomy tube secured Cuff deflated of air and inflated with saline Airway suctioned Electronic Signature(s) Signed: 12/10/2020 2:42:59 PM By: Becky Sax, Sallie RCP, RRT, CHT Entered By:  Becky Sax, Sallie on 12/10/2020 08:38:00

## 2020-12-14 ENCOUNTER — Encounter: Payer: Medicare HMO | Admitting: Physician Assistant

## 2020-12-14 ENCOUNTER — Other Ambulatory Visit: Payer: Self-pay

## 2020-12-14 DIAGNOSIS — M272 Inflammatory conditions of jaws: Secondary | ICD-10-CM | POA: Diagnosis not present

## 2020-12-14 NOTE — Progress Notes (Signed)
[place], [place] ([id number]) Visit Report for 12/14/2020 HBO Details Patient Name: Tyler [name], [name] Date of Service: 12/14/2020 8:00 AM Medical Record Number: [id number] Patient Account Number: [id number] Date of Birth/Sex: 1951/08/22 (70 y.o. M) Treating RN: Carlene Coria Primary Care Ziggy Reveles: Lavon Paganini Other Clinician: Jacqulyn Bath Referring Raliegh Scobie: Lavon Paganini Treating Kaedyn Belardo/Extender: Skipper Cliche in Treatment: 9 HBO Treatment Course Details Treatment Course Number: 1 Ordering Anela Bensman: Jeri Cos Total Treatments Ordered: 30 HBO Treatment Start Date: 11/04/2020 HBO Indication: Osteoradionecrosis of Jaw HBO Treatment Details Treatment Number: 25 Patient Type: Outpatient Chamber Type: Monoplace Chamber Serial #: [id number] Treatment Protocol: 2.5 ATA with 90 minutes oxygen, with two 5 minute air breaks Treatment Details Compression Rate Down: 1.5 psi / minute De-Compression Rate Up: 1.5 psi / minute Compress Tx Pressure Air breaks and breathing periods Decompress Decompress Begins Reached (leave unused spaces blank) Begins Ends Chamber Pressure (ATA) 1 2.5 2.5 2.5 2.5 2.5 - - 2.5 1 Clock Time (24 hr) 08:20 08:35 09:05 09:10 09:41 09:46 - - 10:16 10:31 Treatment Length: 131 (minutes) Treatment Segments: 4 Vital Signs Capillary Blood Glucose Reference Range: 80 - 120 mg / dl HBO Diabetic Blood Glucose Intervention Range: <131 mg/dl or >249 mg/dl Time Vitals Blood Respiratory Capillary Blood Glucose Pulse Action Type: Pulse: Temperature: Taken: Pressure: Rate: Glucose (mg/dl): Meter #: Oximetry (%) Taken: Pre 08:00 159/71 53 18 98.3 Post 10:35 138/80 60 18 98.1 Treatment Response Treatment Toleration: Well Treatment Completion Treatment Completed without Adverse Event Status: Electronic Signature(s) Signed: 12/14/2020 11:14:01 AM By: Paulla Fore, RRT, CHT Signed: 12/14/2020 5:17:55 PM By: Worthy Keeler PA-C Entered By:  Lorine Bears on 12/14/2020 11:11:58 Totzke, Rodriquez A. ([id number]) -------------------------------------------------------------------------------- HBO Safety Checklist Details Patient Name: [name], [name] Date of Service: 12/14/2020 8:00 AM Medical Record Number: [id number] Patient Account Number: [id number] Date of Birth/Sex: 1951/06/23 (70 y.o. M) Treating RN: Carlene Coria Primary Care Latecia Miler: Lavon Paganini Other Clinician: Jacqulyn Bath Referring Cerra Eisenhower: Lavon Paganini Treating Tajana Crotteau/Extender: Skipper Cliche in Treatment: 9 HBO Safety Checklist Items Safety Checklist Consent Form Signed Patient voided / foley secured and emptied When did you last eato 12/13/20 Last dose of injectable or oral agent n/a Ostomy pouch emptied and vented if applicable NA All implantable devices assessed, documented and approved NA Intravenous access site secured and place NA Valuables secured Linens and cotton and cotton/polyester blend (less than 51% polyester) Personal oil-based products / skin lotions / body lotions removed Wigs or hairpieces removed NA Smoking or tobacco materials removed NA Books / newspapers / magazines / loose paper removed NA Cologne, aftershave, perfume and deodorant removed Jewelry removed (may wrap wedding band) Make-up removed NA Hair care products removed Battery operated devices (external) removed NA Heating patches and chemical warmers removed NA Titanium eyewear removed NA Nail polish cured greater than 10 hours NA Casting material cured greater than 10 hours NA Hearing aids removed NA Loose dentures or partials removed NA Prosthetics have been removed NA Patient demonstrates correct use of air break device (if applicable) Patient concerns have been addressed Patient grounding bracelet on and cord attached to chamber Specifics for Inpatients (complete in addition to above) Medication sheet sent with  patient Intravenous medications needed or due during therapy sent with patient Drainage tubes (e.g. nasogastric tube or chest tube secured and vented) Endotracheal or Tracheotomy tube secured Cuff deflated of air and inflated with saline Airway suctioned Electronic Signature(s) Signed: 12/14/2020 11:14:01 AM By: Becky Sax, Sallie RCP, RRT, CHT Entered By: Juleen China,  RCP,RRT,CHT, Sallie on 12/14/2020 08:40:31

## 2020-12-14 NOTE — Progress Notes (Signed)
[place], [place] ([id number]) Visit Report for 12/14/2020 Arrival Information Details Patient Name: TRAYVEN, LUMADUE A. Date of Service: 12/14/2020 8:00 AM Medical Record Number: [id number] Patient Account Number: [id number] Date of Birth/Sex: 03/25/1951 (70 y.o. M) Treating RN: Carlene Coria Primary Care Krisi Azua: Lavon Paganini Other Clinician: Jacqulyn Bath Referring Jackee Glasner: Lavon Paganini Treating Zaraya Delauder/Extender: Skipper Cliche in Treatment: 9 Visit Information History Since Last Visit Added or deleted any medications: No Patient Arrived: Ambulatory Any new allergies or adverse reactions: No Arrival Time: 07:56 Had a fall or experienced change in No Accompanied By: self activities of daily living that may affect Transfer Assistance: None risk of falls: Patient Identification Verified: Yes Signs or symptoms of abuse/neglect since last visito No Secondary Verification Process Completed: Yes Hospitalized since last visit: No Implantable device outside of the clinic excluding No cellular tissue based products placed in the center since last visit: Pain Present Now: No Electronic Signature(s) Signed: 12/14/2020 11:14:01 AM By: Lorine Bears RCP, RRT, CHT Entered By: Lorine Bears on 12/14/2020 08:38:47 [place], [place]. ([id number]) -------------------------------------------------------------------------------- Encounter Discharge Information Details Patient Name: Briguglio, Darreon A. Date of Service: 12/14/2020 8:00 AM Medical Record Number: [id number] Patient Account Number: [id number] Date of Birth/Sex: Jan 31, 1951 (70 y.o. M) Treating RN: Carlene Coria Primary Care Aerielle Stoklosa: Lavon Paganini Other Clinician: Jacqulyn Bath Referring Gracianna Vink: Lavon Paganini Treating Waverly Tarquinio/Extender: Skipper Cliche in Treatment: 9 Encounter Discharge Information Items Discharge Condition: Stable Ambulatory Status: Ambulatory Discharge  Destination: Home Transportation: Private Auto Accompanied By: self Schedule Follow-up Appointment: Yes Clinical Summary of Care: Notes Patient has an HBO treatment scheduled on 12/15/20 at 08:00 am. Electronic Signature(s) Signed: 12/14/2020 11:14:01 AM By: Lorine Bears RCP, RRT, CHT Entered By: Lorine Bears on 12/14/2020 11:13:27 [place], [place] A. ([id number]) -------------------------------------------------------------------------------- Vitals Details Patient Name: Hetzer, Bodi A. Date of Service: 12/14/2020 8:00 AM Medical Record Number: [id number] Patient Account Number: [id number] Date of Birth/Sex: [birth year] (70 y.o. M) Treating RN: Carlene Coria Primary Care Loriel Diehl: Lavon Paganini Other Clinician: Jacqulyn Bath Referring Yobani Schertzer: Lavon Paganini Treating Marlyce Mcdougald/Extender: Skipper Cliche in Treatment: 9 Vital Signs Time Taken: 08:00 Temperature (F): 98.3 Height (in): 72 Pulse (bpm): 53 Weight (lbs): 240 Respiratory Rate (breaths/min): 18 Body Mass Index (BMI): 32.5 Blood Pressure (mmHg): 159/71 Reference Range: 80 - 120 mg / dl Electronic Signature(s) Signed: 12/14/2020 11:14:01 AM By: Lorine Bears RCP, RRT, CHT Entered By: Lorine Bears on 12/14/2020 08:39:25

## 2020-12-15 ENCOUNTER — Encounter: Payer: Medicare HMO | Admitting: Physician Assistant

## 2020-12-15 DIAGNOSIS — M272 Inflammatory conditions of jaws: Secondary | ICD-10-CM | POA: Diagnosis not present

## 2020-12-15 NOTE — Progress Notes (Signed)
[place], [place] ([id number]) Visit Report for 12/15/2020 HBO Details Patient Name: Tyler [name], [name] Date of Service: 12/15/2020 8:00 AM Medical Record Number: [id number] Patient Account Number: [id number] Date of Birth/Sex: 1951-05-13 (70 y.o. M) Treating RN: Dolan Amen Primary Care Arsema Tusing: Lavon Paganini Other Clinician: Jacqulyn Bath Referring Shavelle Runkel: Lavon Paganini Treating Aris Moman/Extender: Skipper Cliche in Treatment: 9 HBO Treatment Course Details Treatment Course Number: 1 Ordering Estefana Taylor: Jeri Cos Total Treatments Ordered: 30 HBO Treatment Start Date: 11/04/2020 HBO Indication: Osteoradionecrosis of Jaw HBO Treatment Details Treatment Number: 26 Patient Type: Outpatient Chamber Type: Monoplace Chamber Serial #: [id number] Treatment Protocol: 2.5 ATA with 90 minutes oxygen, with two 5 minute air breaks Treatment Details Compression Rate Down: 1.5 psi / minute De-Compression Rate Up: 1.5 psi / minute Compress Tx Pressure Air breaks and breathing periods Decompress Decompress Begins Reached (leave unused spaces blank) Begins Ends Chamber Pressure (ATA) 1 2.5 2.5 2.5 2.5 2.5 - - 2.5 1 Clock Time (24 hr) 08:28 08:43 09:13 09:18 09:48 09:53 - - 10:23 10:38 Treatment Length: 130 (minutes) Treatment Segments: 4 Vital Signs Capillary Blood Glucose Reference Range: 80 - 120 mg / dl HBO Diabetic Blood Glucose Intervention Range: <131 mg/dl or >249 mg/dl Time Vitals Blood Respiratory Capillary Blood Glucose Pulse Action Type: Pulse: Temperature: Taken: Pressure: Rate: Glucose (mg/dl): Meter #: Oximetry (%) Taken: Pre 08:00 165/73 54 18 97.8 Post 10:40 150/84 60 18 98.2 Treatment Response Treatment Toleration: Well Treatment Completion Treatment Completed without Adverse Event Status: Electronic Signature(s) Signed: 12/15/2020 11:08:51 AM By: Lorine Bears RCP, RRT, CHT Signed: 12/15/2020 2:16:03 PM By: Worthy Keeler PA-C Entered By:  Lorine Bears on 12/15/2020 11:07:27 Tyler [name], [name] ([id number]) -------------------------------------------------------------------------------- HBO Safety Checklist Details Patient Name: Tyler [name], [name] Date of Service: 12/15/2020 8:00 AM Medical Record Number: [id number] Patient Account Number: [id number] Date of Birth/Sex: 03-03-1951 (70 y.o. M) Treating RN: Dolan Amen Primary Care Keondria Siever: Lavon Paganini Other Clinician: Jacqulyn Bath Referring Marlyn Tondreau: Lavon Paganini Treating Phyllis Whitefield/Extender: Skipper Cliche in Treatment: 9 HBO Safety Checklist Items Safety Checklist Consent Form Signed Patient voided / foley secured and emptied When did you last eato 12/14/20 pm Last dose of injectable or oral agent n/a Ostomy pouch emptied and vented if applicable NA All implantable devices assessed, documented and approved NA Intravenous access site secured and place NA Valuables secured Linens and cotton and cotton/polyester blend (less than 51% polyester) Personal oil-based products / skin lotions / body lotions removed Wigs or hairpieces removed NA Smoking or tobacco materials removed NA Books / newspapers / magazines / loose paper removed NA Cologne, aftershave, perfume and deodorant removed Jewelry removed (may wrap wedding band) Make-up removed NA Hair care products removed Battery operated devices (external) removed NA Heating patches and chemical warmers removed NA Titanium eyewear removed NA Nail polish cured greater than 10 hours NA Casting material cured greater than 10 hours NA Hearing aids removed NA Loose dentures or partials removed NA Prosthetics have been removed NA Patient demonstrates correct use of air break device (if applicable) Patient concerns have been addressed Patient grounding bracelet on and cord attached to chamber Specifics for Inpatients (complete in addition to above) Medication sheet sent with  patient Intravenous medications needed or due during therapy sent with patient Drainage tubes (e.g. nasogastric tube or chest tube secured and vented) Endotracheal or Tracheotomy tube secured Cuff deflated of air and inflated with saline Airway suctioned Electronic Signature(s) Signed: 12/15/2020 11:08:51 AM By: Becky Sax, Sallie RCP, RRT, CHT Entered By:  Becky Sax, Sallie on 12/15/2020 09:23:45

## 2020-12-15 NOTE — Progress Notes (Signed)
[place], [place] ([id number]) Visit Report for 12/15/2020 Arrival Information Details Patient Name: Tyler [name], [name] Date of Service: 12/15/2020 8:00 AM Medical Record Number: [id number] Patient Account Number: [id number] Date of Birth/Sex: May 01, 1951 (70 y.o. M) Treating RN: Dolan Amen Primary Care Justn Quale: Lavon Paganini Other Clinician: Jacqulyn Bath Referring Phillipa Morden: Lavon Paganini Treating Deem Marmol/Extender: Skipper Cliche in Treatment: 9 Visit Information History Since Last Visit Added or deleted any medications: No Patient Arrived: Ambulatory Any new allergies or adverse reactions: No Arrival Time: 07:55 Had a fall or experienced change in No Accompanied By: self activities of daily living that may affect Transfer Assistance: None risk of falls: Patient Identification Verified: Yes Signs or symptoms of abuse/neglect since last visito No Secondary Verification Process Completed: Yes Hospitalized since last visit: No Implantable device outside of the clinic excluding No cellular tissue based products placed in the center since last visit: Pain Present Now: No Electronic Signature(s) Signed: 12/15/2020 11:08:51 AM By: Lorine Bears RCP, RRT, CHT Entered By: Lorine Bears on 12/15/2020 09:22:03 [name], [name] ([id number]) -------------------------------------------------------------------------------- Encounter Discharge Information Details Patient Name: [name], [name] Date of Service: 12/15/2020 8:00 AM Medical Record Number: [id number] Patient Account Number: [id number] Date of Birth/Sex: [birth year] (70 y.o. M) Treating RN: Dolan Amen Primary Care Kortlyn Koltz: Lavon Paganini Other Clinician: Jacqulyn Bath Referring Cait Locust: Lavon Paganini Treating Zaiyah Sottile/Extender: Skipper Cliche in Treatment: 9 Encounter Discharge Information Items Discharge Condition: Stable Ambulatory Status: Ambulatory Discharge  Destination: Home Transportation: Private Auto Accompanied By: self Schedule Follow-up Appointment: Yes Clinical Summary of Care: Notes Patient has an HBO treatment scheduled on 12/16/20 at 08:00 am. Electronic Signature(s) Signed: 12/15/2020 11:08:51 AM By: Lorine Bears RCP, RRT, CHT Entered By: Lorine Bears on 12/15/2020 11:08:23 [name], [name] ([id number]) -------------------------------------------------------------------------------- Vitals Details Patient Name: [name], [name] Date of Service: 12/15/2020 8:00 AM Medical Record Number: [id number] Patient Account Number: [id number] Date of Birth/Sex: March 25, 1951 (70 y.o. M) Treating RN: Dolan Amen Primary Care Reylene Stauder: Lavon Paganini Other Clinician: Jacqulyn Bath Referring Bentlie Catanzaro: Lavon Paganini Treating Timesha Cervantez/Extender: Skipper Cliche in Treatment: 9 Vital Signs Time Taken: 08:00 Temperature (F): 97.8 Height (in): 72 Pulse (bpm): 54 Weight (lbs): 240 Respiratory Rate (breaths/min): 18 Body Mass Index (BMI): 32.5 Blood Pressure (mmHg): 165/73 Reference Range: 80 - 120 mg / dl Electronic Signature(s) Signed: 12/15/2020 11:08:51 AM By: Lorine Bears RCP, RRT, CHT Entered By: Lorine Bears on 12/15/2020 09:22:43

## 2020-12-16 ENCOUNTER — Other Ambulatory Visit: Payer: Self-pay

## 2020-12-16 ENCOUNTER — Encounter: Payer: Medicare HMO | Admitting: Internal Medicine

## 2020-12-16 DIAGNOSIS — M272 Inflammatory conditions of jaws: Secondary | ICD-10-CM | POA: Diagnosis not present

## 2020-12-16 NOTE — Progress Notes (Signed)
[place], [place] ([id number]) Visit Report for 12/16/2020 Arrival Information Details Patient Name: Tyler [name], [name] Date of Service: 12/16/2020 8:00 AM Medical Record Number: [id number] Patient Account Number: [id number] Date of Birth/Sex: [birth year] (70 y.o. M) Treating RN: Cornell Barman Primary Care Frannie Shedrick: Lavon Paganini Other Clinician: Jacqulyn Bath Referring Caliya Narine: Lavon Paganini Treating Macaria Bias/Extender: Tito Dine in Treatment: 9 Visit Information History Since Last Visit Added or deleted any medications: No Patient Arrived: Ambulatory Any new allergies or adverse reactions: No Arrival Time: 07:55 Had a fall or experienced change in No Accompanied By: self activities of daily living that may affect Transfer Assistance: None risk of falls: Patient Identification Verified: Yes Signs or symptoms of abuse/neglect since last visito No Secondary Verification Process Completed: Yes Hospitalized since last visit: No Implantable device outside of the clinic excluding No cellular tissue based products placed in the center since last visit: Pain Present Now: No Electronic Signature(s) Signed: 12/16/2020 11:06:05 AM By: Lorine Bears RCP, RRT, CHT Entered By: Lorine Bears on 12/16/2020 08:27:02 [place], [place]. ([id number]) -------------------------------------------------------------------------------- Encounter Discharge Information Details Patient Name: Tyler [name], [name] Date of Service: 12/16/2020 8:00 AM Medical Record Number: [id number] Patient Account Number: [id number] Date of Birth/Sex: 1950-10-07 (70 y.o. M) Treating RN: Cornell Barman Primary Care Jveon Pound: Lavon Paganini Other Clinician: Jacqulyn Bath Referring Tionne Dayhoff: Lavon Paganini Treating Generoso Cropper/Extender: Tito Dine in Treatment: 9 Encounter Discharge Information Items Discharge Condition: Stable Ambulatory Status: Ambulatory Discharge  Destination: Home Transportation: Private Auto Accompanied By: self Schedule Follow-up Appointment: Yes Clinical Summary of Care: Notes Patient has an HBO treatment scheduled on 12/17/20 at 08:00 am. Electronic Signature(s) Signed: 12/16/2020 11:06:05 AM By: Lorine Bears RCP, RRT, CHT Entered By: Lorine Bears on 12/16/2020 11:04:06 [place], [place] A. ([id number]) -------------------------------------------------------------------------------- Vitals Details Patient Name: Tyler [name], [name] Date of Service: 12/16/2020 8:00 AM Medical Record Number: [id number] Patient Account Number: [id number] Date of Birth/Sex: 12-Oct-1950 (70 y.o. M) Treating RN: Cornell Barman Primary Care Dyrell Tuccillo: Lavon Paganini Other Clinician: Jacqulyn Bath Referring Sumaiya Arruda: Lavon Paganini Treating Haidar Muse/Extender: Tito Dine in Treatment: 9 Vital Signs Time Taken: 08:03 Temperature (F): 98.3 Height (in): 72 Pulse (bpm): 64 Weight (lbs): 240 Respiratory Rate (breaths/min): 18 Body Mass Index (BMI): 32.5 Blood Pressure (mmHg): 168/83 Reference Range: 80 - 120 mg / dl Electronic Signature(s) Signed: 12/16/2020 11:06:05 AM By: Lorine Bears RCP, RRT, CHT Entered By: Lorine Bears on 12/16/2020 08:27:35

## 2020-12-17 ENCOUNTER — Encounter: Payer: Medicare HMO | Admitting: Physician Assistant

## 2020-12-17 DIAGNOSIS — M272 Inflammatory conditions of jaws: Secondary | ICD-10-CM | POA: Diagnosis not present

## 2020-12-17 NOTE — Progress Notes (Addendum)
[place], [place] ([id number]) Visit Report for 12/17/2020 Arrival Information Details Patient Name: Tyler [name], [name] Date of Service: 12/17/2020 8:00 AM Medical Record Number: [id number] Patient Account Number: [id number] Date of Birth/Sex: 1951/03/25 (70 y.o. M) Treating RN: Cornell Barman Primary Care Rhyan Radler: Lavon Paganini Other Clinician: Jacqulyn Bath Referring Aland Chestnutt: Lavon Paganini Treating Vayda Dungee/Extender: Skipper Cliche in Treatment: 10 Visit Information History Since Last Visit Pain Present Now: No Patient Arrived: Ambulatory Arrival Time: 08:13 Accompanied By: self Transfer Assistance: None Patient Identification Verified: Yes Secondary Verification Process Completed: Yes Electronic Signature(s) Signed: 12/17/2020 8:13:22 AM By: [name], BSN, RN, CWS, Kim RN, BSN Entered By: [name], BSN, RN, CWS, Kim on 12/17/2020 08:13:22 [place], [place] Kitchen ([id number]) -------------------------------------------------------------------------------- Encounter Discharge Information Details Patient Name: [name], [name] Date of Service: 12/17/2020 8:00 AM Medical Record Number: [id number] Patient Account Number: [id number] Date of Birth/Sex: October 03, 1950 (70 y.o. M) Treating RN: Cornell Barman Primary Care Sheresa Cullop: Lavon Paganini Other Clinician: Jacqulyn Bath Referring Aubrey Blackard: Lavon Paganini Treating Thao Bauza/Extender: Skipper Cliche in Treatment: 10 Encounter Discharge Information Items Discharge Condition: Stable Ambulatory Status: Ambulatory Discharge Destination: Home Transportation: Private Auto Schedule Follow-up Appointment: Yes Clinical Summary of Care: Electronic Signature(s) Signed: 12/17/2020 5:30:48 PM By: [name], BSN, RN, CWS, Kim RN, BSN Entered By: [name], BSN, RN, CWS, Kim on 12/17/2020 17:30:48 [place], [place] ([id number]) -------------------------------------------------------------------------------- Vitals Details Patient Name: [name], Tyler  A. Date of Service: 12/17/2020 8:00 AM Medical Record Number: [id number] Patient Account Number: [id number] Date of Birth/Sex: [birth year] (70 y.o. M) Treating RN: Cornell Barman Primary Care Priyah Schmuck: Lavon Paganini Other Clinician: Jacqulyn Bath Referring Irine Heminger: Lavon Paganini Treating Hyun Reali/Extender: Skipper Cliche in Treatment: 10 Vital Signs Time Taken: 08:05 Temperature (F): 98.3 Height (in): 72 Pulse (bpm): 72 Weight (lbs): 240 Respiratory Rate (breaths/min): 18 Body Mass Index (BMI): 32.5 Blood Pressure (mmHg): 172/88 Reference Range: 80 - 120 mg / dl Electronic Signature(s) Signed: 12/17/2020 8:13:57 AM By: [name], BSN, RN, CWS, Kim RN, BSN Entered By: [name], BSN, RN, CWS, Kim on 12/17/2020 08:13:57

## 2020-12-17 NOTE — Progress Notes (Addendum)
[place], [place] ([id number]) Visit Report for 12/17/2020 HBO Details Patient Name: Tyler [name], [name] Date of Service: 12/17/2020 8:00 AM Medical Record Number: [id number] Patient Account Number: [id number] Date of Birth/Sex: 1951-03-16 (70 y.o. M) Treating RN: Cornell Barman Primary Care Jerrica Thorman: Lavon Paganini Other Clinician: Jacqulyn Bath Referring Deunta Beneke: Lavon Paganini Treating Jaydenn Boccio/Extender: Skipper Cliche in Treatment: 10 HBO Treatment Course Details Treatment Course Number: 1 Ordering Kevork Joyce: Jeri Cos Total Treatments Ordered: 30 HBO Treatment Start Date: 11/04/2020 HBO Indication: Osteoradionecrosis of Jaw HBO Treatment Details Treatment Number: 28 Patient Type: Outpatient Chamber Type: Monoplace Chamber Serial #: [id number] Treatment Protocol: 2.5 ATA with 90 minutes oxygen, with two 5 minute air breaks Treatment Details Compression Rate Down: 1.5 psi / minute De-Compression Rate Up: 1.5 psi / minute Compress Tx Pressure Air breaks and breathing periods Decompress Decompress Begins Reached (leave unused spaces blank) Begins Ends Chamber Pressure (ATA) 1 2.5 2.5 2.5 2.5 2.5 - - 2.5 1 Clock Time (24 hr) 08:23 08:38 09:10 09:45 - - - - 10:20 10:26 Treatment Length: 123 (minutes) Treatment Segments: 4 Vital Signs Capillary Blood Glucose Reference Range: 80 - 120 mg / dl HBO Diabetic Blood Glucose Intervention Range: <131 mg/dl or >249 mg/dl Time Vitals Blood Respiratory Capillary Blood Glucose Pulse Action Type: Pulse: Temperature: Taken: Pressure: Rate: Glucose (mg/dl): Meter #: Oximetry (%) Taken: Pre 08:05 172/88 72 18 98.3 Post 10:45 158/71 67 18 98 Treatment Response Treatment Toleration: Well Treatment Completion Treatment Completed without Adverse Event Status: Electronic Signature(s) Signed: 12/17/2020 11:05:44 AM By: [name], BSN, RN, CWS, Kim RN, BSN Signed: 12/17/2020 5:44:32 PM By: Worthy Keeler PA-C Previous Signature: 12/17/2020  9:27:18 AM Version By: [name], BSN, RN, CWS, Kim RN, BSN Previous Signature: 12/17/2020 8:44:38 AM Version By: [name], BSN, RN, CWS, Kim RN, BSN Previous Signature: 12/17/2020 8:28:07 AM Version By: [name], BSN, RN, CWS, Kim RN, BSN Entered By: [name], BSN, RN, CWS, Kim on 12/17/2020 11:05:43 [place], [place] A. ([id number]) -------------------------------------------------------------------------------- HBO Safety Checklist Details Patient Name: Mandala, Emet A. Date of Service: 12/17/2020 8:00 AM Medical Record Number: [id number] Patient Account Number: [id number] Date of Birth/Sex: 06/02/1951 (70 y.o. M) Treating RN: Cornell Barman Primary Care Shayne Deerman: Lavon Paganini Other Clinician: Jacqulyn Bath Referring Brode Sculley: Lavon Paganini Treating Treyshawn Muldrew/Extender: Skipper Cliche in Treatment: 10 HBO Safety Checklist Items Safety Checklist Consent Form Signed Patient voided / foley secured and emptied When did you last eato am Last dose of injectable or oral agent Ostomy pouch emptied and vented if applicable NA All implantable devices assessed, documented and approved NA Intravenous access site secured and place NA Valuables secured Linens and cotton and cotton/polyester blend (less than 51% polyester) Personal oil-based products / skin lotions / body lotions removed Wigs or hairpieces removed NA Smoking or tobacco materials removed NA Books / newspapers / magazines / loose paper removed Cologne, aftershave, perfume and deodorant removed Jewelry removed (may wrap wedding band) Make-up removed NA Hair care products removed NA Battery operated devices (external) removed NA Heating patches and chemical warmers removed NA Titanium eyewear removed NA Nail polish cured greater than 10 hours NA Casting material cured greater than 10 hours NA Hearing aids removed NA Loose dentures or partials removed Prosthetics have been removed NA Patient demonstrates correct use of  air break device (if applicable) Patient concerns have been addressed Patient grounding bracelet on and cord attached to chamber Specifics for Inpatients (complete in addition to above) Medication sheet sent with patient Intravenous medications needed or due during therapy sent with  patient Drainage tubes (e.g. nasogastric tube or chest tube secured and vented) Endotracheal or Tracheotomy tube secured Cuff deflated of air and inflated with saline Airway suctioned Electronic Signature(s) Signed: 12/17/2020 8:27:20 AM By: [name], BSN, RN, CWS, Kim RN, BSN Entered By: [name], BSN, RN, CWS, Kim on 12/17/2020 08:27:20

## 2020-12-17 NOTE — Progress Notes (Signed)
[place], [place] ([id number]) Visit Report for 12/16/2020 HBO Details Patient Name: [name], [name] Date of Service: 12/16/2020 8:00 AM Medical Record Number: [id number] Patient Account Number: [id number] Date of Birth/Sex: [birth year] (70 y.o. M) Treating RN: Cornell Barman Primary Care Ryanna Teschner: Lavon Paganini Other Clinician: Jacqulyn Bath Referring Denisia Harpole: Lavon Paganini Treating Halley Kincer/Extender: Tito Dine in Treatment: 9 HBO Treatment Course Details Treatment Course Number: 1 Ordering Desaray Marschner: Jeri Cos Total Treatments Ordered: 30 HBO Treatment Start Date: 11/04/2020 HBO Indication: Osteoradionecrosis of Jaw HBO Treatment Details Treatment Number: 27 Patient Type: Outpatient Chamber Type: Monoplace Chamber Serial #: [id number] Treatment Protocol: 2.5 ATA with 90 minutes oxygen, with two 5 minute air breaks Treatment Details Compression Rate Down: 1.5 psi / minute De-Compression Rate Up: 2.5 psi / minute Compress Tx Pressure Air breaks and breathing periods Decompress Decompress Begins Reached (leave unused spaces blank) Begins Ends Chamber Pressure (ATA) 1 2.5 2.5 2.5 2.5 2.5 - - 2.5 1 Clock Time (24 hr) 08:11 08:26 08:56 09:02 09:32 09:38 - - 10:08 10:17 Treatment Length: 126 (minutes) Treatment Segments: 4 Vital Signs Capillary Blood Glucose Reference Range: 80 - 120 mg / dl HBO Diabetic Blood Glucose Intervention Range: <131 mg/dl or >249 mg/dl Time Vitals Blood Respiratory Capillary Blood Glucose Pulse Action Type: Pulse: Temperature: Taken: Pressure: Rate: Glucose (mg/dl): Meter #: Oximetry (%) Taken: Pre 08:03 168/83 64 18 98.3 Post 10:20 138/82 60 18 98.1 Treatment Response Treatment Toleration: Well Treatment Completion Treatment Completed without Adverse Event Status: Marthena Whitmyer Notes No concerns with treatment given HBO Attestation I certify that I supervised this HBO treatment in accordance with Medicare guidelines. A trained  emergency response team is readily Yes available per hospital policies and procedures. Continue HBOT as ordered. Yes Electronic Signature(s) Signed: 12/17/2020 8:16:57 AM By: Linton Ham MD Previous Signature: 12/16/2020 11:06:05 AM Version By: Lorine Bears RCP, RRT, CHT Entered By: Linton Ham on 12/16/2020 13:35:46 [place], [place] A. ([id number]) -------------------------------------------------------------------------------- HBO Safety Checklist Details Patient Name: [name], [name] Date of Service: 12/16/2020 8:00 AM Medical Record Number: [id number] Patient Account Number: [id number] Date of Birth/Sex: Nov 17, 1950 (70 y.o. M) Treating RN: Cornell Barman Primary Care Lois Slagel: Lavon Paganini Other Clinician: Jacqulyn Bath Referring Nathanyal Ashmead: Lavon Paganini Treating Navid Lenzen/Extender: Tito Dine in Treatment: 9 HBO Safety Checklist Items Safety Checklist Consent Form Signed Patient voided / foley secured and emptied When did you last eato 12/15/20 pm Last dose of injectable or oral agent n/a Ostomy pouch emptied and vented if applicable NA All implantable devices assessed, documented and approved NA Intravenous access site secured and place NA Valuables secured Linens and cotton and cotton/polyester blend (less than 51% polyester) Personal oil-based products / skin lotions / body lotions removed Wigs or hairpieces removed NA Smoking or tobacco materials removed NA Books / newspapers / magazines / loose paper removed NA Cologne, aftershave, perfume and deodorant removed Jewelry removed (may wrap wedding band) Make-up removed NA Hair care products removed Battery operated devices (external) removed NA Heating patches and chemical warmers removed NA Titanium eyewear removed NA Nail polish cured greater than 10 hours NA Casting material cured greater than 10 hours NA Hearing aids removed NA Loose dentures or partials  removed NA Prosthetics have been removed NA Patient demonstrates correct use of air break device (if applicable) Patient concerns have been addressed Patient grounding bracelet on and cord attached to chamber Specifics for Inpatients (complete in addition to above) Medication sheet sent with patient Intravenous medications needed or due during therapy sent  with patient Drainage tubes (e.g. nasogastric tube or chest tube secured and vented) Endotracheal or Tracheotomy tube secured Cuff deflated of air and inflated with saline Airway suctioned Electronic Signature(s) Signed: 12/16/2020 11:06:05 AM By: Lorine Bears RCP, RRT, CHT Entered By: Lorine Bears on 12/16/2020 94:80:16

## 2020-12-18 ENCOUNTER — Other Ambulatory Visit: Payer: Self-pay

## 2020-12-18 ENCOUNTER — Encounter: Payer: Medicare HMO | Attending: Physician Assistant | Admitting: Physician Assistant

## 2020-12-18 DIAGNOSIS — M272 Inflammatory conditions of jaws: Secondary | ICD-10-CM | POA: Insufficient documentation

## 2020-12-18 NOTE — Progress Notes (Signed)
Tyler [name] ([id number]) Visit Report for 12/18/2020 Arrival Information Details Patient Name: Tyler [name], [name] Date of Service: 12/18/2020 8:00 AM Medical Record Number: [id number] Patient Account Number: [id number] Date of Birth/Sex: [birth year] (70 y.o. M) Treating RN: Carlene Coria Primary Care Kalani Baray: Lavon Paganini Other Clinician: Jacqulyn Bath Referring Dulcinea Kinser: Lavon Paganini Treating Nubia Ziesmer/Extender: Skipper Cliche in Treatment: 10 Visit Information History Since Last Visit Added or deleted any medications: No Patient Arrived: Ambulatory Any new allergies or adverse reactions: No Arrival Time: 07:55 Had a fall or experienced change in No Accompanied By: self activities of daily living that may affect Transfer Assistance: None risk of falls: Patient Identification Verified: Yes Signs or symptoms of abuse/neglect since last visito No Secondary Verification Process Completed: Yes Hospitalized since last visit: No Implantable device outside of the clinic excluding No cellular tissue based products placed in the center since last visit: Pain Present Now: No Electronic Signature(s) Signed: 12/18/2020 11:52:49 AM By: Enedina Finner RCP, RRT, CHT Entered By: Enedina Finner on 12/18/2020 08:38:56 [place], [place]. ([id number]) -------------------------------------------------------------------------------- Encounter Discharge Information Details Patient Name: [name], [name] Date of Service: 12/18/2020 8:00 AM Medical Record Number: [id number] Patient Account Number: [id number] Date of Birth/Sex: [birth year] (70 y.o. M) Treating RN: Carlene Coria Primary Care Budd Freiermuth: Lavon Paganini Other Clinician: Jacqulyn Bath Referring Ryon Layton: Lavon Paganini Treating Cem Kosman/Extender: Skipper Cliche in Treatment: 10 Encounter Discharge Information Items Discharge Condition: Stable Ambulatory Status: Ambulatory Discharge Destination:  Home Transportation: Private Auto Accompanied By: self Schedule Follow-up Appointment: Yes Clinical Summary of Care: Notes Patient has an HBO treatment scheduled on 12/21/20 at 08:00 am. Electronic Signature(s) Signed: 12/18/2020 11:52:49 AM By: Enedina Finner RCP, RRT, CHT Entered By: Enedina Finner on 12/18/2020 11:50:34 [place], [place]. ([id number]) -------------------------------------------------------------------------------- Vitals Details Patient Name: [name], [name] Date of Service: 12/18/2020 8:00 AM Medical Record Number: [id number] Patient Account Number: [id number] Date of Birth/Sex: 03-30-1951 (70 y.o. M) Treating RN: Carlene Coria Primary Care Jaiyanna Safran: Lavon Paganini Other Clinician: Jacqulyn Bath Referring Ruchama Kubicek: Lavon Paganini Treating Zahari Xiang/Extender: Skipper Cliche in Treatment: 10 Vital Signs Time Taken: 08:00 Temperature (F): 98.1 Height (in): 72 Pulse (bpm): 54 Weight (lbs): 240 Respiratory Rate (breaths/min): 18 Body Mass Index (BMI): 32.5 Blood Pressure (mmHg): 148/80 Reference Range: 80 - 120 mg / dl Electronic Signature(s) Signed: 12/18/2020 11:52:49 AM By: Enedina Finner RCP, RRT, CHT Entered By: Enedina Finner on 12/18/2020 08:39:34

## 2020-12-18 NOTE — Progress Notes (Signed)
[place], [place] ([id number]) Visit Report for 12/18/2020 HBO Details Patient Name: Tyler [name], [name] Date of Service: 12/18/2020 8:00 AM Medical Record Number: [id number] Patient Account Number: [id number] Date of Birth/Sex: December 24, 1950 (70 y.o. M) Treating RN: Carlene Coria Primary Care Meliana Canner: Lavon Paganini Other Clinician: Jacqulyn Bath Referring Vung Kush: Lavon Paganini Treating Daleisa Halperin/Extender: Skipper Cliche in Treatment: 10 HBO Treatment Course Details Treatment Course Number: 1 Ordering Raffaela Ladley: Jeri Cos Total Treatments Ordered: 30 HBO Treatment Start Date: 11/04/2020 HBO Indication: Osteoradionecrosis of Jaw HBO Treatment Details Treatment Number: 29 Patient Type: Outpatient Chamber Type: Monoplace Chamber Serial #: [id number] Treatment Protocol: 2.5 ATA with 90 minutes oxygen, with two 5 minute air breaks Treatment Details Compression Rate Down: 1.5 psi / minute De-Compression Rate Up: 1.5 psi / minute Compress Tx Pressure Air breaks and breathing periods Decompress Decompress Begins Reached (leave unused spaces blank) Begins Ends Chamber Pressure (ATA) 1 2.5 2.5 2.5 2.5 2.5 - - 2.5 1 Clock Time (24 hr) 08:19 08:34 09:05 09:10 09:40 09:45 - - 10:15 10:31 Treatment Length: 132 (minutes) Treatment Segments: 4 Vital Signs Capillary Blood Glucose Reference Range: 80 - 120 mg / dl HBO Diabetic Blood Glucose Intervention Range: <131 mg/dl or >249 mg/dl Time Vitals Blood Respiratory Capillary Blood Glucose Pulse Action Type: Pulse: Temperature: Taken: Pressure: Rate: Glucose (mg/dl): Meter #: Oximetry (%) Taken: Pre 08:00 148/80 54 18 98.1 Treatment Response Treatment Toleration: Well Treatment Completion Treatment Completed without Adverse Event Status: Electronic Signature(s) Signed: 12/18/2020 11:52:49 AM By: Enedina Finner RCP, RRT, CHT Signed: 12/18/2020 4:34:06 PM By: Worthy Keeler PA-C Entered By: Enedina Finner on 12/18/2020  11:49:44 [place], [place] A. ([id number]) -------------------------------------------------------------------------------- HBO Safety Checklist Details Patient Name: Tyler [name], [name] Date of Service: 12/18/2020 8:00 AM Medical Record Number: [id number] Patient Account Number: [id number] Date of Birth/Sex: 04/11/1951 (70 y.o. M) Treating RN: Carlene Coria Primary Care Liliann File: Lavon Paganini Other Clinician: Jacqulyn Bath Referring Pablo Stauffer: Lavon Paganini Treating Paytin Ramakrishnan/Extender: Skipper Cliche in Treatment: 10 HBO Safety Checklist Items Safety Checklist Consent Form Signed Patient voided / foley secured and emptied When did you last eato 12/17/20 Last dose of injectable or oral agent n/a Ostomy pouch emptied and vented if applicable NA All implantable devices assessed, documented and approved NA Intravenous access site secured and place NA Valuables secured Linens and cotton and cotton/polyester blend (less than 51% polyester) Personal oil-based products / skin lotions / body lotions removed Wigs or hairpieces removed NA Smoking or tobacco materials removed NA Books / newspapers / magazines / loose paper removed NA Cologne, aftershave, perfume and deodorant removed Jewelry removed (may wrap wedding band) Make-up removed NA Hair care products removed Battery operated devices (external) removed NA Heating patches and chemical warmers removed NA Titanium eyewear removed NA Nail polish cured greater than 10 hours NA Casting material cured greater than 10 hours NA Hearing aids removed NA Loose dentures or partials removed NA Prosthetics have been removed NA Patient demonstrates correct use of air break device (if applicable) Patient concerns have been addressed Patient grounding bracelet on and cord attached to chamber Specifics for Inpatients (complete in addition to above) Medication sheet sent with patient Intravenous medications needed or due  during therapy sent with patient Drainage tubes (e.g. nasogastric tube or chest tube secured and vented) Endotracheal or Tracheotomy tube secured Cuff deflated of air and inflated with saline Airway suctioned Electronic Signature(s) Signed: 12/18/2020 11:52:49 AM By: Enedina Finner RCP, RRT, CHT Entered By: Enedina Finner on 12/18/2020 08:40:43

## 2020-12-21 ENCOUNTER — Other Ambulatory Visit: Payer: Self-pay

## 2020-12-21 ENCOUNTER — Encounter: Payer: Medicare HMO | Admitting: Physician Assistant

## 2020-12-21 DIAGNOSIS — M272 Inflammatory conditions of jaws: Secondary | ICD-10-CM | POA: Diagnosis not present

## 2020-12-21 NOTE — Progress Notes (Addendum)
[place], [place] ([id number]) Visit Report for 12/21/2020 Arrival Information Details Patient Name: DEVON, PRETTY A. Date of Service: 12/21/2020 8:00 AM Medical Record Number: [id number] Patient Account Number: [id number] Date of Birth/Sex: May 24, 1951 (70 y.o. M) Treating RN: Carlene Coria Primary Care Linford Quintela: Lavon Paganini Other Clinician: Jacqulyn Bath Referring Janalynn Eder: Lavon Paganini Treating Jansel Vonstein/Extender: Skipper Cliche in Treatment: 10 Visit Information History Since Last Visit Added or deleted any medications: No Patient Arrived: Ambulatory Any new allergies or adverse reactions: No Arrival Time: 07:55 Had a fall or experienced change in No Accompanied By: self activities of daily living that may affect Transfer Assistance: None risk of falls: Patient Identification Verified: Yes Signs or symptoms of abuse/neglect since last visito No Secondary Verification Process Completed: Yes Hospitalized since last visit: No Implantable device outside of the clinic excluding No cellular tissue based products placed in the center since last visit: Pain Present Now: No Electronic Signature(s) Signed: 12/21/2020 4:21:58 PM By: Enedina Finner RCP, RRT, CHT Entered By: Enedina Finner on 12/21/2020 08:25:21 [place], [place] A. ([id number]) -------------------------------------------------------------------------------- Encounter Discharge Information Details Patient Name: Schopf, Samanyu A. Date of Service: 12/21/2020 8:00 AM Medical Record Number: [id number] Patient Account Number: [id number] Date of Birth/Sex: 1951-01-06 (70 y.o. 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 Electronic Signature(s) Signed: 12/21/2020 4:21:58 PM By: Enedina Finner RCP, RRT, CHT Entered By: Enedina Finner on 12/21/2020 12:00:15 [place], [place] A. ([id number]) -------------------------------------------------------------------------------- Liberal Details Patient Name: Manzi, Shaquille A. Date of Service: 12/21/2020 8:00 AM Medical Record Number: [id number] Patient Account Number: [id number] Date of Birth/Sex: April 19, 1951 (70 y.o. M) Treating RN: Cornell Barman Primary Care Loren Vicens: Lavon Paganini Other Clinician: Jacqulyn Bath Referring Beverlyann Broxterman: Lavon Paganini Treating Dariann Huckaba/Extender: Skipper Cliche in Treatment: 10 Active Inactive Electronic Signature(s) Signed: 12/30/2020 5:59:37 PM By: [name], BSN, RN, CWS, Kim RN, BSN Entered By: [name], BSN, RN, CWS, Kim on 12/30/2020 17:59:37 [place], [place] ([id number]) -------------------------------------------------------------------------------- Vitals Details Patient Name: Duggan, Antonie A. Date of Service: 12/21/2020 8:00 AM Medical Record Number: [id number] Patient Account Number: [id number] Date of Birth/Sex: April 25, 1951 (70 y.o. M) Treating RN: Carlene Coria Primary Care Dietrich Samuelson: Lavon Paganini Other Clinician: Jacqulyn Bath Referring Parks Czajkowski: Lavon Paganini Treating Bertha Earwood/Extender: Skipper Cliche in Treatment: 10 Vital Signs Time Taken: 08:00 Temperature (F): 98.2 Height (in): 72 Pulse (bpm): 54 Weight (lbs): 240 Respiratory Rate (breaths/min): 18 Body Mass Index (BMI): 32.5 Blood Pressure (mmHg): 150/78 Reference Range: 80 - 120 mg / dl Electronic Signature(s) Signed: 12/21/2020 4:21:58 PM By: Enedina Finner RCP, RRT, CHT Entered By: Enedina Finner on 12/21/2020 08:26:01

## 2020-12-21 NOTE — Progress Notes (Signed)
Tyler [name] ([id number]) Visit Report for 12/21/2020 HBO Details Patient Name: Tyler [name], [name] Date of Service: 12/21/2020 8:00 AM Medical Record Number: [id number] Patient Account Number: [id number] Date of Birth/Sex: October 23, 1950 (70 y.o. M) Treating RN: Carlene Coria Primary Care Marilyn Wing: Lavon Paganini Other Clinician: Jacqulyn Bath Referring Klaus Casteneda: Lavon Paganini Treating Zanetta Dehaan/Extender: Skipper Cliche in Treatment: 10 HBO Treatment Course Details Treatment Course Number: 1 Ordering Keandra Medero: Jeri Cos Total Treatments Ordered: 30 HBO Treatment Start 11/04/2020 Date: HBO Indication: Osteoradionecrosis of Jaw HBO Treatment End 12/21/2020 Date: HBO Discharge Treatment Series Complete; STRN/ORN Protocol Outcome: Goals Achieved HBO Treatment Details Treatment Number: 30 Patient Type: Outpatient Chamber Type: Monoplace Chamber Serial #: [id number] Treatment Protocol: 2.5 ATA with 90 minutes oxygen, with two 5 minute air breaks Treatment Details Compression Rate Down: 1.5 psi / minute De-Compression Rate Up: 1.5 psi / minute Compress Tx Pressure Air breaks and breathing periods Decompress Decompress Begins Reached (leave unused spaces blank) Begins Ends Chamber Pressure (ATA) 1 2.5 2.5 2.5 2.5 2.5 - - 2.5 1 Clock Time (24 hr) 08:17 08:32 09:02 09:07 09:37 09:43 - - 10:13 10:28 Treatment Length: 131 (minutes) Treatment Segments: 4 Vital Signs Capillary Blood Glucose Reference Range: 80 - 120 mg / dl HBO Diabetic Blood Glucose Intervention Range: <131 mg/dl or >249 mg/dl Time Vitals Blood Respiratory Capillary Blood Glucose Pulse Action Type: Pulse: Temperature: Taken: Pressure: Rate: Glucose (mg/dl): Meter #: Oximetry (%) Taken: Pre 08:00 150/78 54 18 98.2 Post 10:30 136/80 60 18 98 Treatment Response Treatment Toleration: Well Treatment Completion Treatment Completed without Adverse Event Status: Electronic Signature(s) Signed: 12/21/2020 4:21:58 PM  By: Enedina Finner RCP, RRT, CHT Signed: 12/21/2020 5:03:26 PM By: Worthy Keeler PA-C Entered By: Enedina Finner on 12/21/2020 11:58:55 Hollis, Dacono A. ([id number]) -------------------------------------------------------------------------------- HBO Safety Checklist Details Patient Name: Done, [name] Date of Service: 12/21/2020 8:00 AM Medical Record Number: [id number] Patient Account Number: [id number] Date of Birth/Sex: 1950-11-22 (70 y.o. M) Treating RN: Carlene Coria Primary Care Zakaiya Lares: Lavon Paganini Other Clinician: Jacqulyn Bath Referring Kleigh Hoelzer: Lavon Paganini Treating Jashay Roddy/Extender: Skipper Cliche in Treatment: 10 HBO Safety Checklist Items Safety Checklist Consent Form Signed Patient voided / foley secured and emptied When did you last eato 12/20/20 pm Last dose of injectable or oral agent n/a Ostomy pouch emptied and vented if applicable NA All implantable devices assessed, documented and approved NA Intravenous access site secured and place NA Valuables secured Linens and cotton and cotton/polyester blend (less than 51% polyester) Personal oil-based products / skin lotions / body lotions removed Wigs or hairpieces removed NA Smoking or tobacco materials removed NA Books / newspapers / magazines / loose paper removed NA Cologne, aftershave, perfume and deodorant removed Jewelry removed (may wrap wedding band) Make-up removed NA Hair care products removed Battery operated devices (external) removed NA Heating patches and chemical warmers removed NA Titanium eyewear removed NA Nail polish cured greater than 10 hours NA Casting material cured greater than 10 hours NA Hearing aids removed NA Loose dentures or partials removed NA Prosthetics have been removed NA Patient demonstrates correct use of air break device (if applicable) Patient concerns have been addressed Patient grounding bracelet on and cord attached  to chamber Specifics for Inpatients (complete in addition to above) Medication sheet sent with patient Intravenous medications needed or due during therapy sent with patient Drainage tubes (e.g. nasogastric tube or chest tube secured and vented) Endotracheal or Tracheotomy tube secured Cuff deflated of air and inflated with saline Airway suctioned  Electronic Signature(s) Signed: 12/21/2020 4:21:58 PM By: Enedina Finner RCP, RRT, CHT Entered By: Enedina Finner on 12/21/2020 08:27:07

## 2021-05-13 ENCOUNTER — Other Ambulatory Visit: Payer: Self-pay

## 2021-05-13 ENCOUNTER — Encounter: Payer: Self-pay | Admitting: Dermatology

## 2021-05-13 ENCOUNTER — Ambulatory Visit: Payer: Medicare HMO | Admitting: Dermatology

## 2021-05-13 DIAGNOSIS — B079 Viral wart, unspecified: Secondary | ICD-10-CM

## 2021-05-13 DIAGNOSIS — D1800 Hemangioma unspecified site: Secondary | ICD-10-CM

## 2021-05-13 DIAGNOSIS — L820 Inflamed seborrheic keratosis: Secondary | ICD-10-CM

## 2021-05-13 DIAGNOSIS — L918 Other hypertrophic disorders of the skin: Secondary | ICD-10-CM | POA: Diagnosis not present

## 2021-05-13 DIAGNOSIS — Z1283 Encounter for screening for malignant neoplasm of skin: Secondary | ICD-10-CM

## 2021-05-13 DIAGNOSIS — Z86018 Personal history of other benign neoplasm: Secondary | ICD-10-CM | POA: Diagnosis not present

## 2021-05-13 DIAGNOSIS — D229 Melanocytic nevi, unspecified: Secondary | ICD-10-CM | POA: Diagnosis not present

## 2021-05-13 DIAGNOSIS — L304 Erythema intertrigo: Secondary | ICD-10-CM | POA: Diagnosis not present

## 2021-05-13 DIAGNOSIS — L578 Other skin changes due to chronic exposure to nonionizing radiation: Secondary | ICD-10-CM | POA: Diagnosis not present

## 2021-05-13 DIAGNOSIS — Z8521 Personal history of malignant neoplasm of larynx: Secondary | ICD-10-CM

## 2021-05-13 DIAGNOSIS — L814 Other melanin hyperpigmentation: Secondary | ICD-10-CM | POA: Diagnosis not present

## 2021-05-13 DIAGNOSIS — L821 Other seborrheic keratosis: Secondary | ICD-10-CM | POA: Diagnosis not present

## 2021-05-13 NOTE — Progress Notes (Signed)
Follow-Up Visit   Subjective  Tyler [name] is a 70 y.o. male who presents for the following: Total body skin exam (Hx of multiple dysplastic nevi) and check spots (R face, nostril, ). The patient presents for Total-Body Skin Exam (TBSE) for skin cancer screening and mole check.  The following portions of the chart were reviewed this encounter and updated as appropriate:   Tobacco  Allergies  Meds  Problems  Med Hx  Surg Hx  Fam Hx     Review of Systems:  No other skin or systemic complaints except as noted in HPI or Assessment and Plan.  Objective  Well appearing patient in no apparent distress; mood and affect are within normal limits.  A full examination was performed including scalp, head, eyes, ears, nose, lips, neck, chest, axillae, abdomen, back, buttocks, bilateral upper extremities, bilateral lower extremities, hands, feet, fingers, toes, fingernails, and toenails. All findings within normal limits unless otherwise noted below.  multiple Scars with no evidence of recurrence.   R cheek x 1, Total = 1 Erythematous keratotic or waxy stuck-on papule or plaque.   R proximal nasal alar rim x 1 3.58m verrucous pap  bil axilla Pinkness bil axilla  bil axilla Fleshy, skin-colored pedunculated papules.     Assessment & Plan  History of dysplastic nevus multiple Clear. Observe for recurrence. Call clinic for new or changing lesions.  Recommend regular skin exams, daily broad-spectrum spf 30+ sunscreen use, and photoprotection.    Inflamed seborrheic keratosis R cheek x 1, Total = 1 Destruction of lesion - R cheek x 1, Total = 1 Complexity: simple   Destruction method: cryotherapy   Informed consent: discussed and consent obtained   Timeout:  patient name, date of birth, surgical site, and procedure verified Lesion destroyed using liquid nitrogen: Yes   Region frozen until ice ball extended beyond lesion: Yes   Outcome: patient tolerated procedure well with no  complications   Post-procedure details: wound care instructions given    Viral warts, unspecified type R proximal nasal alar rim x 1 Discussed viral etiology and risk of spread.  Discussed multiple treatments may be required to clear warts.  Discussed possible post-treatment dyspigmentation and risk of recurrence.  Destruction of lesion - R proximal nasal alar rim x 1 Complexity: simple   Destruction method: cryotherapy   Informed consent: discussed and consent obtained   Timeout:  patient name, date of birth, surgical site, and procedure verified Lesion destroyed using liquid nitrogen: Yes   Region frozen until ice ball extended beyond lesion: Yes   Outcome: patient tolerated procedure well with no complications   Post-procedure details: wound care instructions given    Intertrigo bil axillae Intertrigo is a chronic recurrent rash that occurs in skin fold areas that may be associated with friction; heat; moisture; yeast; fungus; and bacteria.  It is exacerbated by increased movement / activity; sweating; and higher atmospheric temperature.  Discussed Skin Medicinals Intertrigo mix, pt declines at this time but may call in future for perscription  Skin tag bil axilla Discussed snip excision  Skin cancer screening  Lentigines - Scattered tan macules - Due to sun exposure - Benign-appering, observe - Recommend daily broad spectrum sunscreen SPF 30+ to sun-exposed areas, reapply every 2 hours as needed. - Call for any changes  Seborrheic Keratoses - Stuck-on, waxy, tan-brown papules and/or plaques  - Benign-appearing - Discussed benign etiology and prognosis. - Observe - Call for any changes  Melanocytic Nevi - Tan-brown and/or pink-flesh-colored  symmetric macules and papules - Benign appearing on exam today - Observation - Call clinic for new or changing moles - Recommend daily use of broad spectrum spf 30+ sunscreen to sun-exposed areas.   Hemangiomas - Red  papules - Discussed benign nature - Observe - Call for any changes  Actinic Damage - Chronic condition, secondary to cumulative UV/sun exposure - diffuse scaly erythematous macules with underlying dyspigmentation - Recommend daily broad spectrum sunscreen SPF 30+ to sun-exposed areas, reapply every 2 hours as needed.  - Staying in the shade or wearing long sleeves, sun glasses (UVA+UVB protection) and wide brim hats (4-inch brim around the entire circumference of the hat) are also recommended for sun protection.  - Call for new or changing lesions.  Skin cancer screening performed today.  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  I, Othelia Pulling, RMA, am acting as scribe for Sarina Ser, MD . Documentation: I have reviewed the above documentation for accuracy and completeness, and I agree with the above.  Sarina Ser, MD

## 2021-05-13 NOTE — Patient Instructions (Signed)

## 2021-07-14 ENCOUNTER — Ambulatory Visit: Payer: Medicare HMO | Admitting: Dermatology

## 2021-07-14 ENCOUNTER — Other Ambulatory Visit: Payer: Self-pay

## 2021-07-14 DIAGNOSIS — B078 Other viral warts: Secondary | ICD-10-CM | POA: Diagnosis not present

## 2021-07-14 DIAGNOSIS — D229 Melanocytic nevi, unspecified: Secondary | ICD-10-CM | POA: Diagnosis not present

## 2021-07-14 DIAGNOSIS — L578 Other skin changes due to chronic exposure to nonionizing radiation: Secondary | ICD-10-CM | POA: Diagnosis not present

## 2021-07-14 DIAGNOSIS — L820 Inflamed seborrheic keratosis: Secondary | ICD-10-CM | POA: Diagnosis not present

## 2021-07-14 DIAGNOSIS — L918 Other hypertrophic disorders of the skin: Secondary | ICD-10-CM

## 2021-07-14 DIAGNOSIS — B079 Viral wart, unspecified: Secondary | ICD-10-CM | POA: Diagnosis not present

## 2021-07-14 DIAGNOSIS — Z1283 Encounter for screening for malignant neoplasm of skin: Secondary | ICD-10-CM

## 2021-07-14 DIAGNOSIS — D485 Neoplasm of uncertain behavior of skin: Secondary | ICD-10-CM

## 2021-07-14 NOTE — Progress Notes (Signed)
Follow-Up Visit   Subjective  Tyler [name] is a 70 y.o. male who presents for the following: Warts (2 month follow up of right prox nasal alar rim treated with LN2) and Skin Tag (Bilateral axilla, left hip, left abdomen and right groin). The patient presents for Upper Body Skin Exam (UBSE) for skin cancer screening and mole check.  The following portions of the chart were reviewed this encounter and updated as appropriate:   Tobacco  Allergies  Meds  Problems  Med Hx  Surg Hx  Fam Hx     Review of Systems:  No other skin or systemic complaints except as noted in HPI or Assessment and Plan.  Objective  Well appearing patient in no apparent distress; mood and affect are within normal limits.  All skin waist up examined.  Right prox nasal alar rim Verrucous papule 0.4 cm  Right axilla x 3, right groin x 1, left axilla x 8, trunk x 4 (16) Fleshy, skin-colored pedunculated papules.     Assessment & Plan  Neoplasm of uncertain behavior of skin Right prox nasal alar rim 0.4 cm  Epidermal / dermal shaving  Informed consent: discussed and consent obtained   Timeout: patient name, date of birth, surgical site, and procedure verified   Procedure prep:  Patient was prepped and draped in usual sterile fashion Prep type:  Isopropyl alcohol Anesthesia: the lesion was anesthetized in a standard fashion   Anesthetic:  1% lidocaine w/ epinephrine 1-100,000 buffered w/ 8.4% NaHCO3 Instrument used: flexible razor blade   Hemostasis achieved with: pressure, aluminum chloride and electrodesiccation   Outcome: patient tolerated procedure well   Post-procedure details: sterile dressing applied and wound care instructions given   Dressing type: bandage and petrolatum    Specimen 1 - Surgical pathology Differential Diagnosis: Viral wart vs other Check Margins: No  Skin tag (16) Right axilla x 3, right groin x 1, left axilla x 8, trunk x 4  Epidermal / dermal shaving - Right axilla  x 3, right groin x 1, left axilla x 8, trunk x 4  Informed consent: discussed and consent obtained   Patient was prepped and draped in usual sterile fashion: area prepped with alcohol. Anesthesia: the lesion was anesthetized in a standard fashion   Anesthetic:  1% lidocaine w/ epinephrine 1-100,000 buffered w/ 8.4% NaHCO3 Instrument used: scissors   Hemostasis achieved with: pressure, aluminum chloride and electrodesiccation   Outcome: patient tolerated procedure well   Post-procedure details: wound care instructions given   Post-procedure details comment:  Ointment and bandaid applied  Inflamed seborrheic keratosis Left Abdomen (side) - Upper x 2  Destruction of lesion - Left Abdomen (side) - Upper Complexity: simple   Destruction method: cryotherapy   Informed consent: discussed and consent obtained   Timeout:  patient name, date of birth, surgical site, and procedure verified Lesion destroyed using liquid nitrogen: Yes   Region frozen until ice ball extended beyond lesion: Yes   Outcome: patient tolerated procedure well with no complications   Post-procedure details: wound care instructions given    Skin cancer screening  Melanocytic Nevi - Tan-brown and/or pink-flesh-colored symmetric macules and papules - Benign appearing on exam today - Observation - Call clinic for new or changing moles - Recommend daily use of broad spectrum spf 30+ sunscreen to sun-exposed areas.   Actinic Damage - chronic, secondary to cumulative UV radiation exposure/sun exposure over time - diffuse scaly erythematous macules with underlying dyspigmentation - Recommend daily broad spectrum sunscreen  SPF 30+ to sun-exposed areas, reapply every 2 hours as needed.  - Recommend staying in the shade or wearing long sleeves, sun glasses (UVA+UVB protection) and wide brim hats (4-inch brim around the entire circumference of the hat). - Call for new or changing lesions.  Return in about 6 months (around  01/12/2022) for UBSE.  I, Ashok Cordia, CMA, am acting as scribe for Sarina Ser, MD . Documentation: I have reviewed the above documentation for accuracy and completeness, and I agree with the above.  Sarina Ser, MD

## 2021-07-14 NOTE — Patient Instructions (Signed)
Wound Care Instructions  Cleanse wound gently with soap and water once a day then pat dry with clean gauze. Apply a thing coat of Petrolatum (petroleum jelly, "Vaseline") over the wound (unless you have an allergy to this). We recommend that you use a new, sterile tube of Vaseline. Do not pick or remove scabs. Do not remove the yellow or white "healing tissue" from the base of the wound.  Cover the wound with fresh, clean, nonstick gauze and secure with paper tape. You may use Band-Aids in place of gauze and tape if the would is small enough, but would recommend trimming much of the tape off as there is often too much. Sometimes Band-Aids can irritate the skin.  You should call the office for your biopsy report after 1 week if you have not already been contacted.  If you experience any problems, such as abnormal amounts of bleeding, swelling, significant bruising, significant pain, or evidence of infection, please call the office immediately.  FOR ADULT SURGERY PATIENTS: If you need something for pain relief you may take 1 extra strength Tylenol (acetaminophen) AND 2 Ibuprofen (200mg each) together every 4 hours as needed for pain. (do not take these if you are allergic to them or if you have a reason you should not take them.) Typically, you may only need pain medication for 1 to 3 days.   If you have any questions or concerns for your doctor, please call our main line at [phone] and press option 4 to reach your doctor's medical assistant. If no one answers, please leave a voicemail as directed and we will return your call as soon as possible. Messages left after 4 pm will be answered the following business day.   You may also send us a message via MyChart. We typically respond to MyChart messages within 1-2 business days.  For prescription refills, please ask your pharmacy to contact our office. Our fax number is [phone].  If you have an urgent issue when the clinic is closed that  cannot wait until the next business day, you can page your doctor at the number below.    Please note that while we do our best to be available for urgent issues outside of office hours, we are not available 24/7.   If you have an urgent issue and are unable to reach us, you may choose to seek medical care at your doctor's office, retail clinic, urgent care center, or emergency room.  If you have a medical emergency, please immediately call 911 or go to the emergency department.  Pager Numbers  - Dr. Kowalski: [phone]  - Dr. Moye: [phone]  - Dr. Stewart: [phone]  In the event of inclement weather, please call our main line at [phone] for an update on the status of any delays or closures.  Dermatology Medication Tips: Please keep the boxes that topical medications come in in order to help keep track of the instructions about where and how to use these. Pharmacies typically print the medication instructions only on the boxes and not directly on the medication tubes.   If your medication is too expensive, please contact our office at [phone] option 4 or send us a message through MyChart.   We are unable to tell what your co-pay for medications will be in advance as this is different depending on your insurance coverage. However, we may be able to find a substitute medication at lower cost or fill out paperwork to get insurance to cover a needed   medication.   If a prior authorization is required to get your medication covered by your insurance company, please allow us 1-2 business days to complete this process.  Drug prices often vary depending on where the prescription is filled and some pharmacies may offer cheaper prices.  The website www.goodrx.com contains coupons for medications through different pharmacies. The prices here do not account for what the cost may be with help from insurance (it may be cheaper with your insurance), but the website can give you the  price if you did not use any insurance.  - You can print the associated coupon and take it with your prescription to the pharmacy.  - You may also stop by our office during regular business hours and pick up a GoodRx coupon card.  - If you need your prescription sent electronically to a different pharmacy, notify our office through [place] MyChart or by phone at [phone] option 4.   

## 2021-07-15 ENCOUNTER — Encounter: Payer: Self-pay | Admitting: Dermatology

## 2021-07-16 ENCOUNTER — Telehealth: Payer: Self-pay

## 2021-07-16 NOTE — Telephone Encounter (Signed)
-----   Message from Ralene Bathe, MD sent at 07/15/2021  6:39 PM EDT ----- Diagnosis Skin , right prox nasal alar rim VERRUCA VULGARIS, IRRITATED  Benign irritated viral wart May recur No further treatment needed at this time

## 2021-07-16 NOTE — Telephone Encounter (Signed)
Patient informed of pathology results 

## 2021-09-28 ENCOUNTER — Ambulatory Visit (INDEPENDENT_AMBULATORY_CARE_PROVIDER_SITE_OTHER): Payer: Medicare HMO

## 2021-09-28 DIAGNOSIS — Z0000 Encounter for general adult medical examination without abnormal findings: Secondary | ICD-10-CM | POA: Diagnosis not present

## 2021-09-28 NOTE — Patient Instructions (Signed)
Tyler [name] , Thank you for taking time to come for your Medicare Wellness Visit. I appreciate your ongoing commitment to your health goals. Please review the following plan we discussed and let me know if I can assist you in the future.   Screening recommendations/referrals: Colonoscopy: 10/09/18 Recommended yearly ophthalmology/optometry visit for glaucoma screening and checkup Recommended yearly dental visit for hygiene and checkup  Vaccinations: Influenza vaccine: 05/28/19, due  Pneumococcal vaccine: 10/17/19 Tdap vaccine: 05/11/15 Shingles vaccine: n/d   Covid-19: n/d  Advanced directives: yes, requested copy  Conditions/risks identified: none  Next appointment: Follow up in one year for your annual wellness visit. 09/29/22 @ 8:20am  Preventive Care 65 Years and Older, Male Preventive care refers to lifestyle choices and visits with your health care provider that can promote health and wellness. What does preventive care include? A yearly physical exam. This is also called an annual well check. Dental exams once or twice a year. Routine eye exams. Ask your health care provider how often you should have your eyes checked. Personal lifestyle choices, including: Daily care of your teeth and gums. Regular physical activity. Eating a healthy diet. Avoiding tobacco and drug use. Limiting alcohol use. Practicing safe sex. Taking low doses of aspirin every day. Taking vitamin and mineral supplements as recommended by your health care provider. What happens during an annual well check? The services and screenings done by your health care provider during your annual well check will depend on your age, overall health, lifestyle risk factors, and family history of disease. Counseling  Your health care provider may ask you questions about your: Alcohol use. Tobacco use. Drug use. Emotional well-being. Home and relationship well-being. Sexual activity. Eating habits. History of  falls. Memory and ability to understand (cognition). Work and work Statistician. Screening  You may have the following tests or measurements: Height, weight, and BMI. Blood pressure. Lipid and cholesterol levels. These may be checked every 5 years, or more frequently if you are over 51 years old. Skin check. Lung cancer screening. You may have this screening every year starting at age 57 if you have a 30-pack-year history of smoking and currently smoke or have quit within the past 15 years. Fecal occult blood test (FOBT) of the stool. You may have this test every year starting at age 69. Flexible sigmoidoscopy or colonoscopy. You may have a sigmoidoscopy every 5 years or a colonoscopy every 10 years starting at age 30. Prostate cancer screening. Recommendations will vary depending on your family history and other risks. Hepatitis C blood test. Hepatitis B blood test. Sexually transmitted disease (STD) testing. Diabetes screening. This is done by checking your blood sugar (glucose) after you have not eaten for a while (fasting). You may have this done every 1-3 years. Abdominal aortic aneurysm (AAA) screening. You may need this if you are a current or former smoker. Osteoporosis. You may be screened starting at age 58 if you are at high risk. Talk with your health care provider about your test results, treatment options, and if necessary, the need for more tests. Vaccines  Your health care provider may recommend certain vaccines, such as: Influenza vaccine. This is recommended every year. Tetanus, diphtheria, and acellular pertussis (Tdap, Td) vaccine. You may need a Td booster every 10 years. Zoster vaccine. You may need this after age 67. Pneumococcal 13-valent conjugate (PCV13) vaccine. One dose is recommended after age 8. Pneumococcal polysaccharide (PPSV23) vaccine. One dose is recommended after age 64. Talk to your health care provider  about which screenings and vaccines you need and  how often you need them. This information is not intended to replace advice given to you by your health care provider. Make sure you discuss any questions you have with your health care provider. Document Released: 10/02/2015 Document Revised: 05/25/2016 Document Reviewed: 07/07/2015 Elsevier Interactive Patient Education  2017 [place] Prevention in the Home Falls can cause injuries. They can happen to people of all ages. There are many things you can do to make your home safe and to help prevent falls. What can I do on the outside of my home? Regularly fix the edges of walkways and driveways and fix any cracks. Remove anything that might make you trip as you walk through a door, such as a raised step or threshold. Trim any bushes or trees on the path to your home. Use bright outdoor lighting. Clear any walking paths of anything that might make someone trip, such as rocks or tools. Regularly check to see if handrails are loose or broken. Make sure that both sides of any steps have handrails. Any raised decks and porches should have guardrails on the edges. Have any leaves, snow, or ice cleared regularly. Use sand or salt on walking paths during winter. Clean up any spills in your garage right away. This includes oil or grease spills. What can I do in the bathroom? Use night lights. Install grab bars by the toilet and in the tub and shower. Do not use towel bars as grab bars. Use non-skid mats or decals in the tub or shower. If you need to sit down in the shower, use a plastic, non-slip stool. Keep the floor dry. Clean up any water that spills on the floor as soon as it happens. Remove soap buildup in the tub or shower regularly. Attach bath mats securely with double-sided non-slip rug tape. Do not have throw rugs and other things on the floor that can make you trip. What can I do in the bedroom? Use night lights. Make sure that you have a light by your bed that is easy to  reach. Do not use any sheets or blankets that are too big for your bed. They should not hang down onto the floor. Have a firm chair that has side arms. You can use this for support while you get dressed. Do not have throw rugs and other things on the floor that can make you trip. What can I do in the kitchen? Clean up any spills right away. Avoid walking on wet floors. Keep items that you use a lot in easy-to-reach places. If you need to reach something above you, use a strong step stool that has a grab bar. Keep electrical cords out of the way. Do not use floor polish or wax that makes floors slippery. If you must use wax, use non-skid floor wax. Do not have throw rugs and other things on the floor that can make you trip. What can I do with my stairs? Do not leave any items on the stairs. Make sure that there are handrails on both sides of the stairs and use them. Fix handrails that are broken or loose. Make sure that handrails are as long as the stairways. Check any carpeting to make sure that it is firmly attached to the stairs. Fix any carpet that is loose or worn. Avoid having throw rugs at the top or bottom of the stairs. If you do have throw rugs, attach them to the floor  with carpet tape. Make sure that you have a light switch at the top of the stairs and the bottom of the stairs. If you do not have them, ask someone to add them for you. What else can I do to help prevent falls? Wear shoes that: Do not have high heels. Have rubber bottoms. Are comfortable and fit you well. Are closed at the toe. Do not wear sandals. If you use a stepladder: Make sure that it is fully opened. Do not climb a closed stepladder. Make sure that both sides of the stepladder are locked into place. Ask someone to hold it for you, if possible. Clearly [place] and make sure that you can see: Any grab bars or handrails. First and last steps. Where the edge of each step is. Use tools that help you move  around (mobility aids) if they are needed. These include: Canes. Walkers. Scooters. Crutches. Turn on the lights when you go into a dark area. Replace any light bulbs as soon as they burn out. Set up your furniture so you have a clear path. Avoid moving your furniture around. If any of your floors are uneven, fix them. If there are any pets around you, be aware of where they are. Review your medicines with your doctor. Some medicines can make you feel dizzy. This can increase your chance of falling. Ask your doctor what other things that you can do to help prevent falls. This information is not intended to replace advice given to you by your health care provider. Make sure you discuss any questions you have with your health care provider. Document Released: 07/02/2009 Document Revised: 02/11/2016 Document Reviewed: 10/10/2014 Elsevier Interactive Patient Education  2017 [employer].

## 2021-09-28 NOTE — Progress Notes (Signed)
Virtual Visit via Telephone Note  I connected with  Tyler [name] on 09/28/21 at  8:20 AM EST by telephone and verified that I am speaking with the correct person using two identifiers.  Location: Patient: home Provider: [facility] Persons participating in the virtual visit: [place]   I discussed the limitations, risks, security and privacy concerns of performing an evaluation and management service by telephone and the availability of in person appointments. The patient expressed understanding and agreed to proceed.  Interactive audio and video telecommunications were attempted between this nurse and patient, however failed, due to patient having technical difficulties OR patient did not have access to video capability.  We continued and completed visit with audio only.  Some vital signs may be absent or patient reported.   Tyler David, LPN  Subjective:   Tyler [name] is a 71 y.o. male who presents for Medicare Annual/Subsequent preventive examination.  Review of Systems           Objective:    There were no vitals filed for this visit. There is no height or weight on file to calculate BMI.  Advanced Directives 09/23/2020 09/28/2019 09/10/2019 10/09/2018  Does Patient Have a Medical Advance Directive? Yes Yes Yes Yes  Type of Paramedic of [place];Living will Healthcare Power of [place];Living will [place];Living will  Copy of [place] in Chart? No - copy requested - No - copy requested No - copy requested    Current Medications (verified) Outpatient Encounter Medications as of 09/28/2021  Medication Sig   amLODipine (NORVASC) 10 MG tablet Take 1 tablet (10 mg total) by mouth daily. Please schedule an office visit before anymore refills.   hydrochlorothiazide (HYDRODIURIL) 25 MG tablet TAKE 1 TABLET BY MOUTH EVERY DAY   mupirocin ointment (BACTROBAN) 2 % Apply 1  application topically daily. Qd to excision site (Patient not taking: Reported on 09/23/2020)   rosuvastatin (CRESTOR) 20 MG tablet TAKE 1 TABLET BY MOUTH EVERY DAY   sildenafil (VIAGRA) 100 MG tablet Take 0.5-1 tablets (50-100 mg total) by mouth as needed for erectile dysfunction. (Patient not taking: Reported on 09/23/2020)   No facility-administered encounter medications on file as of 09/28/2021.    Allergies (verified) Patient has no known allergies.   History: Past Medical History:  Diagnosis Date   Dysplastic nevus 02/09/2009   L upper back med to mid scapula - mod   Dysplastic nevus 02/09/2009   R bicep - mod   Dysplastic nevus 04/08/2009   L upper back med to mid scapula 7.0 cm lat to spine - excision   Dysplastic nevus 04/08/2009   Med and inf to L upper back med to med scapula 7.0 cm lat to spine - mod, excsion 06/03/2009   Dysplastic nevus 09/22/2009   R upper back - mild   Dysplastic nevus 09/22/2009   L upper back sup 4.0 cm lat to spine - mild   Dysplastic nevus 09/22/2009   L upper back inf 4.0 cm lat to spine - mild   Dysplastic nevus 09/22/2009   R side 14.0 cm above waistline - mild   Dysplastic nevus 07/15/2013   Midline chest - mod   Dysplastic nevus 12/18/2017   R ant lat deltoid - moderate   Dysplastic nevus 01/30/2018   L med calf near popliteal - severe   Dysplastic nevus 01/30/2018   L post lat base of neck - severe  Dysplastic nevus 01/30/2018   L 5.0 cm lat to spine upper back med to mid scapula - mod to severe   History of pilonidal cyst    Hyperlipidemia    Hypertension    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   Treated with Chemo and radiation at [facility]   Past Surgical History:  Procedure Laterality Date   COLONOSCOPY     COLONOSCOPY WITH PROPOFOL N/A 10/09/2018   Procedure: COLONOSCOPY WITH PROPOFOL;  Surgeon: Lucilla Lame, MD;  [facility] ENDOSCOPY;  Service: Endoscopy;  Laterality: N/A;   KNEE ARTHROSCOPY Right    NASAL SEPTUM SURGERY  2008    NASAL SINUS SURGERY  2008   PILONIDAL CYST EXCISION     VASECTOMY     Family History  Problem Relation Age of Onset   Heart disease Mother    Hyperlipidemia Mother    Hypertension Mother    CVA Mother 6   Leukemia Father    Diabetes Brother    Colon cancer Brother 41   Healthy Daughter    Healthy Daughter    Colon cancer Paternal Uncle    Social History   Socioeconomic History   Marital status: Married    Spouse name: Not on file   Number of children: 2   Years of education: Not on file   Highest education level: Some college, no degree  Occupational History   Occupation: retired Medical illustrator  Tobacco Use   Smoking status: Never   Smokeless tobacco: Never  Vaping Use   Vaping Use: Never used  Substance and Sexual Activity   Alcohol use: Yes    Alcohol/week: 3.0 standard drinks    Types: 3 Cans of beer per week   Drug use: Never   Sexual activity: Yes    Partners: Female    Birth control/protection: Surgical  Other Topics Concern   Not on file  Social History Narrative   Not on file   Social Determinants of Health   Financial Resource Strain: Not on file  Food Insecurity: Not on file  Transportation Needs: Not on file  Physical Activity: Not on file  Stress: Not on file  Social Connections: Not on file    Tobacco Counseling Counseling given: Not Answered   Clinical Intake:  Pre-visit preparation completed: Yes  Pain : No/denies pain     Nutritional Risks: None Diabetes: No  How often do you need to have someone help you when you read instructions, pamphlets, or other written materials from your doctor or pharmacy?: 1 - Never  Diabetic?no  Interpreter Needed?: No  Information entered by :: Kirke Shaggy, LPN   Activities of Daily Living No flowsheet data found.  Patient Care Team: Virginia Crews, MD as PCP - General (Family Medicine) Ralene Bathe, MD (Dermatology) [facility])  Indicate any recent  Medical Services you may have received from other than [facility] providers in the past year (date may be approximate).     Assessment:   This is a routine wellness examination for Tyler [name].  Hearing/Vision screen No results found.  Dietary issues and exercise activities discussed:     Goals Addressed   None    Depression Screen PHQ 2/9 Scores 09/23/2020 09/10/2019 09/10/2019 09/03/2018 07/19/2018  PHQ - 2 Score 0 0 0 0 0  PHQ- 9 Score - - - 0 0    Fall Risk Fall Risk  09/23/2020 10/14/2019 10/02/2019 09/10/2019 09/03/2018  Falls in the past year? 0 0 0 0 0  Number  falls in past yr: 0 0 0 0 -  Injury with Fall? 0 0 0 0 -    FALL RISK PREVENTION PERTAINING TO THE HOME:  Any stairs in or around the home? Yes  If so, are there any without handrails? No  Home free of loose throw rugs in walkways, pet beds, electrical cords, etc? Yes  Adequate lighting in your home to reduce risk of falls? Yes   ASSISTIVE DEVICES UTILIZED TO PREVENT FALLS:  Life alert? No  Use of a cane, walker or w/c? No  Grab bars in the bathroom? Yes  Shower chair or bench in shower? Yes  Elevated toilet seat or a handicapped toilet? Yes    Cognitive Function:Normal cognitive status assessed by direct observation by this Nurse Health Advisor. No abnormalities found.       6CIT Screen 09/03/2018  What Year? 0 points  What month? 0 points  What time? 0 points  Count back from 20 0 points  Months in reverse 0 points  Repeat phrase 0 points  Total Score 0    Immunizations Immunization History  Administered Date(s) Administered   Influenza, High Dose Seasonal PF 07/19/2018, 05/28/2019   Pneumococcal Conjugate-13 07/19/2018   Pneumococcal Polysaccharide-23 10/17/2019   Td 03/04/2008   Tdap 05/11/2015    TDAP status: Up to date  Flu Vaccine status: Declined, Education has been provided regarding the importance of this vaccine but patient still declined. Advised may receive this vaccine at local pharmacy or  Health Dept. Aware to provide a copy of the vaccination record if obtained from local pharmacy or Health Dept. Verbalized acceptance and understanding.  Pneumococcal vaccine status: Up to date  Covid-19 vaccine status: Declined, Education has been provided regarding the importance of this vaccine but patient still declined. Advised may receive this vaccine at local pharmacy or Health Dept.or vaccine clinic. Aware to provide a copy of the vaccination record if obtained from local pharmacy or Health Dept. Verbalized acceptance and understanding.  Qualifies for Shingles Vaccine? Yes   Zostavax completed No   Shingrix Completed?: No.    Education has been provided regarding the importance of this vaccine. Patient has been advised to call insurance company to determine out of pocket expense if they have not yet received this vaccine. Advised may also receive vaccine at local pharmacy or Health Dept. Verbalized acceptance and understanding.  Screening Tests Health Maintenance  Topic Date Due   COVID-19 Vaccine (1) Never done   Zoster Vaccines- Shingrix (1 of 2) Never done   INFLUENZA VACCINE  04/19/2021   COLONOSCOPY (Pts 45-57yrs Insurance coverage will need to be confirmed)  10/10/2023   TETANUS/TDAP  05/10/2025   Pneumonia Vaccine 20+ Years old  Completed   Hepatitis C Screening  Completed   HPV VACCINES  Aged Out    Health Maintenance  Health Maintenance Due  Topic Date Due   COVID-19 Vaccine (1) Never done   Zoster Vaccines- Shingrix (1 of 2) Never done   INFLUENZA VACCINE  04/19/2021    Colorectal cancer screening: Type of screening: Colonoscopy. Completed 10/09/2018. Repeat every 5 years  Lung Cancer Screening: (Low Dose CT Chest recommended if Age 8-80 years, 30 pack-year currently smoking OR have quit w/in 15years.) does not qualify.    Additional Screening:  Hepatitis C Screening: does qualify; Completed 07/23/18  Vision Screening: Recommended annual ophthalmology exams  for early detection of glaucoma and other disorders of the eye. Is the patient up to date with their annual eye exam?  Yes  Who is the provider or what is the name of the office in which the patient attends annual eye exams? Dr.Thurmond If pt is not established with a provider, would they like to be referred to a provider to establish care? No .   Dental Screening: Recommended annual dental exams for proper oral hygiene  Community Resource Referral / Chronic Care Management: CRR required this visit?  No   CCM required this visit?  No      Plan:     I have personally reviewed and noted the following in the patients chart:   Medical and social history Use of alcohol, tobacco or illicit drugs  Current medications and supplements including opioid prescriptions. Patient is not currently taking opioid prescriptions. Functional ability and status Nutritional status Physical activity Advanced directives List of other physicians Hospitalizations, surgeries, and ER visits in previous 12 months Vitals Screenings to include cognitive, depression, and falls Referrals and appointments  In addition, I have reviewed and discussed with patient certain preventive protocols, quality metrics, and best practice recommendations. A written personalized care plan for preventive services as well as general preventive health recommendations were provided to patient.     Tyler David, LPN   5/73/2202   Nurse Notes: none

## 2021-10-19 ENCOUNTER — Ambulatory Visit: Payer: Medicare HMO | Admitting: Dermatology

## 2021-10-19 ENCOUNTER — Encounter: Payer: Self-pay | Admitting: Dermatology

## 2021-10-19 ENCOUNTER — Other Ambulatory Visit: Payer: Self-pay

## 2021-10-19 DIAGNOSIS — B078 Other viral warts: Secondary | ICD-10-CM | POA: Diagnosis not present

## 2021-10-19 NOTE — Progress Notes (Signed)
° °  Follow-Up Visit   Subjective  Tyler [name] is a 71 y.o. male who presents for the following: Warts (Right prox nasal alar rim. Bx 07/14/2021. Patient reports area has grown back and is larger).  The following portions of the chart were reviewed this encounter and updated as appropriate:  Tobacco   Allergies   Meds   Problems   Med Hx   Surg Hx   Fam Hx      Review of Systems: No other skin or systemic complaints except as noted in HPI or Assessment and Plan.  Objective  Well appearing patient in no apparent distress; mood and affect are within normal limits.  A focused examination was performed including face. Relevant physical exam findings are noted in the Assessment and Plan.  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  Discussed multiple treatments may be required to clear warts.  Discussed possible post-treatment dyspigmentation and risk of recurrence.  Discussed treatment options. Shave removal, LN2. Patient elects LN2 today.   Destruction of lesion - Right prox nasal alar rim x1 Complexity: simple   Destruction method: cryotherapy   Informed consent: discussed and consent obtained   Timeout:  patient name, date of birth, surgical site, and procedure verified Lesion destroyed using liquid nitrogen: Yes   Region frozen until ice ball extended beyond lesion: Yes   Outcome: patient tolerated procedure well with no complications   Post-procedure details: wound care instructions given    Return for wart recheck in 5 weeks.  I, Emelia Salisbury, CMA, am acting as scribe for Sarina Ser, MD.  Documentation: I have reviewed the above documentation for accuracy and completeness, and I agree with the above.  Sarina Ser, MD

## 2021-10-19 NOTE — Patient Instructions (Addendum)
Cryotherapy Aftercare  Wash gently with soap and water everyday.   Apply Vaseline and Band-Aid daily until healed.   Prior to procedure, discussed risks of blister formation, small wound, skin dyspigmentation, or rare scar following cryotherapy. Recommend Vaseline ointment to treated areas while healing.     If You Need Anything After Your Visit  If you have any questions or concerns for your doctor, please call our main line at [phone] and press option 4 to reach your doctor's medical assistant. If no one answers, please leave a voicemail as directed and we will return your call as soon as possible. Messages left after 4 pm will be answered the following business day.   You may also send [place] a message via [place]. We typically respond to MyChart messages within 1-2 business days.  For prescription refills, please ask your pharmacy to contact our office. Our fax number is [phone].  If you have an urgent issue when the clinic is closed that cannot wait until the next business day, you can page your doctor at the number below.    Please note that while we do our best to be available for urgent issues outside of office hours, we are not available 24/7.   If you have an urgent issue and are unable to reach [place], you may choose to seek medical care at your doctor's office, retail clinic, urgent care center, or emergency room.  If you have a medical emergency, please immediately call 911 or go to the emergency department.  Pager Numbers  - Dr. Nehemiah Massed: [phone]  - Dr. Laurence Ferrari: [phone]  - Dr. Nicole Kindred: [phone]  In the event of inclement weather, please call our main line at [phone] for an update on the status of any delays or closures.  Dermatology Medication Tips: Please keep the boxes that topical medications come in in order to help keep track of the instructions about where and how to use these. Pharmacies typically print the medication instructions only on the  boxes and not directly on the medication tubes.   If your medication is too expensive, please contact our office at [phone] option 4 or send [place] a message through [place].   We are unable to tell what your co-pay for medications will be in advance as this is different depending on your insurance coverage. However, we may be able to find a substitute medication at lower cost or fill out paperwork to get insurance to cover a needed medication.   If a prior authorization is required to get your medication covered by your insurance company, please allow [place] 1-2 business days to complete this process.  Drug prices often vary depending on where the prescription is filled and some pharmacies may offer cheaper prices.  The website www.goodrx.com contains coupons for medications through different pharmacies. The prices here do not account for what the cost may be with help from insurance (it may be cheaper with your insurance), but the website can give you the price if you did not use any insurance.  - You can print the associated coupon and take it with your prescription to the pharmacy.  - You may also stop by our office during regular business hours and pick up a GoodRx coupon card.  - If you need your prescription sent electronically to a different pharmacy, notify our office through [facility] or by phone at [phone] option 4.  Viral Warts & Molluscum Contagiosum  Viral warts and molluscum contagiosum are growths of the skin caused  by viral infection of the skin. If you have been given the diagnosis of viral warts or molluscum contagiosum there are a few things that you must understand about your condition:  There is no guaranteed treatment method available for this condition. Multiple treatments may be required, The treatments may be time consuming and require multiple visits to the dermatology office. The treatment may be expensive. You will be charged each time you come into the  office to have the spots treated. The treated areas may develop new lesions further complicating treatment. The treated areas may leave a scar. There is no guarantee that even after multiple treatments that the spots will be successfully treated. These are caused by a viral infection and can be spread to other areas of the skin and to other people by direct contact. Therefore, new spots may occur.   Si Usted Necesita Algo Despus de Su Visita  Tambin puede enviarnos un mensaje a travs de Pharmacist, community. Por lo general respondemos a los mensajes de MyChart en el transcurso de 1 a 2 das hbiles.  Para renovar recetas, por favor pida a su farmacia que se ponga en contacto con nuestra oficina. Harland Dingwall de fax es [place] [phone].  Si tiene un asunto urgente cuando la clnica est cerrada y que no puede esperar hasta el siguiente da hbil, puede llamar/localizar a su doctor(a) al nmero que aparece a continuacin.   Por favor, tenga en cuenta que aunque hacemos todo lo posible para estar disponibles para asuntos urgentes fuera del horario de [place], no estamos disponibles las 24 horas del da, los 7 das de la [place].   Si tiene un problema urgente y no puede comunicarse con nosotros, puede optar por buscar atencin mdica  en el consultorio de su doctor(a), en una clnica privada, en un centro de atencin urgente o en una sala de emergencias.  Si tiene Engineering geologist, por favor llame inmediatamente al 911 o vaya a la sala de emergencias.  Nmeros de bper  - Dr. Nehemiah Massed: [phone]  - Dra. Moye: [phone]  - Dra. Nicole Kindred: [phone]  En caso de inclemencias del [place], por favor llame a Johnsie Kindred principal al [phone] para una actualizacin sobre el [place] de cualquier retraso o cierre.  Consejos para la medicacin en dermatologa: Por favor, guarde las cajas en las que vienen los medicamentos de uso tpico para ayudarle a seguir las instrucciones sobre dnde y  cmo usarlos. Las farmacias generalmente imprimen las instrucciones del medicamento slo en las cajas y no directamente en los tubos del [place].   Si su medicamento es muy caro, por favor, pngase en contacto con Zigmund Daniel llamando al [phone] y presione la opcin 4 o envenos un mensaje a travs de Pharmacist, community.   No podemos decirle cul ser su copago por los medicamentos por adelantado ya que esto es diferente dependiendo de la cobertura de su seguro. Sin embargo, es posible que podamos encontrar un medicamento sustituto a Electrical engineer un formulario para que el seguro cubra el medicamento que se considera necesario.   Si se requiere una autorizacin previa para que su compaa de seguros [place] su medicamento, por favor permtanos de 1 a 2 das hbiles para completar este proceso.  Los precios de los medicamentos varan con frecuencia dependiendo del Environmental consultant de dnde se surte la receta y alguna farmacias pueden ofrecer precios ms baratos.  El sitio web www.goodrx.com tiene cupones para medicamentos de Airline pilot. Los precios aqu no tienen en cuenta lo  que podra costar con la ayuda del seguro (puede ser ms barato con su seguro), pero el sitio web puede darle el precio si no Field seismologist.  - Puede imprimir el cupn correspondiente y llevarlo con su receta a la farmacia.  - Tambin puede pasar por nuestra oficina durante el horario de atencin regular y Charity fundraiser una tarjeta de cupones de GoodRx.  - Si necesita que su receta se enve electrnicamente a una farmacia diferente, informe a nuestra oficina a travs de MyChart de [place] o por telfono llamando al [phone] y presione la opcin 4.

## 2021-10-21 ENCOUNTER — Encounter: Payer: Self-pay | Admitting: Dermatology

## 2021-11-12 NOTE — Progress Notes (Signed)
I,Sulibeya S Dimas,acting as a scribe for Lavon Paganini, MD.,have documented all relevant documentation on the behalf of Lavon Paganini, MD,as directed by  Lavon Paganini, MD while in the presence of Lavon Paganini, MD.  Established patient visit   Patient: Tyler [name]   DOB: 02/01/1951   70 y.o. Male  MRN: [id number] Visit Date: 11/15/2021  Today's healthcare provider: Lavon Paganini, MD   Chief Complaint  Patient presents with   Hypertension   Hyperlipidemia   Subjective    HPI  Hypertension, follow-up  BP Readings from Last 3 Encounters:  11/15/21 (!) 180/104  10/17/19 (!) 173/93  10/14/19 (!) 164/93   Wt Readings from Last 3 Encounters:  11/15/21 256 lb 12.8 oz (116.5 kg)  10/17/19 264 lb 3.2 oz (119.8 kg)  10/14/19 261 lb (118.4 kg)     He was last seen for hypertension 2 years ago.  BP at that visit was 173/93. Management since that visit includes no changes f/u in one month.  He reports excellent compliance with treatment. He is not having side effects.  He is following a Regular diet. He is exercising. He does not smoke.  Use of agents associated with hypertension: none.   Outside blood pressures are not being checked. Symptoms: No chest pain No chest pressure  No palpitations No syncope  No dyspnea No orthopnea  No paroxysmal nocturnal dyspnea No lower extremity edema   Pertinent labs: Lab Results  Component Value Date   CHOL 175 05/06/2019   HDL 42 05/06/2019   LDLCALC 85 05/06/2019   TRIG 239 (H) 05/06/2019   CHOLHDL 4.2 05/06/2019   Lab Results  Component Value Date   NA 139 09/29/2019   K 3.8 09/29/2019   CREATININE 1.59 (H) 09/29/2019   GFRNONAA 45 (L) 09/29/2019   GLUCOSE 126 (H) 09/29/2019     The 10-year ASCVD risk score (Arnett DK, et al., 2019) is: 31.3%   --------------------------------------------------------------------------------------------------- Lipid/Cholesterol, Follow-up  Last lipid panel  Other pertinent labs  Lab Results  Component Value Date   CHOL 175 05/06/2019   HDL 42 05/06/2019   LDLCALC 85 05/06/2019   TRIG 239 (H) 05/06/2019   CHOLHDL 4.2 05/06/2019   Lab Results  Component Value Date   ALT 28 09/29/2019   AST 24 09/29/2019   PLT 242 09/29/2019     He was last seen for this 2 years ago.  Management since that visit includes no changes.  He reports excellent compliance with treatment. He is not having side effects.   Symptoms: No chest pain No chest pressure/discomfort  No dyspnea No lower extremity edema  No numbness or tingling of extremity No orthopnea  No palpitations No paroxysmal nocturnal dyspnea  No speech difficulty No syncope   Current diet: in general, a "healthy" diet   Current exercise:  golf  The 10-year ASCVD risk score (Arnett DK, et al., 2019) is: 31.3%  ---------------------------------------------------------------------------------------------------   Medications: Outpatient Medications Prior to Visit  Medication Sig   amLODipine (NORVASC) 10 MG tablet Take 1 tablet (10 mg total) by mouth daily. Please schedule an office visit before anymore refills.   amoxicillin (AMOXIL) 500 MG capsule Take 500 mg by mouth 3 (three) times daily.   hydrochlorothiazide (HYDRODIURIL) 25 MG tablet TAKE 1 TABLET BY MOUTH EVERY DAY   pentoxifylline (TRENTAL) 400 MG CR tablet Take by mouth.   rosuvastatin (CRESTOR) 20 MG tablet TAKE 1 TABLET BY MOUTH EVERY DAY   [DISCONTINUED] mupirocin ointment (BACTROBAN)  2 % Apply 1 application topically daily. Qd to excision site (Patient not taking: Reported on 09/23/2020)   [DISCONTINUED] sildenafil (VIAGRA) 100 MG tablet Take 0.5-1 tablets (50-100 mg total) by mouth as needed for erectile dysfunction. (Patient not taking: Reported on 09/23/2020)   No facility-administered medications prior to visit.    Review of Systems per HPI      Objective    BP (!) 180/104 (Patient Position: Sitting, Cuff Size:  Normal)    Temp 98 F (36.7 C) (Temporal)    Resp 16    Ht 6' (1.829 m)    Wt 256 lb 12.8 oz (116.5 kg)    BMI 34.83 kg/m  BP Readings from Last 3 Encounters:  11/15/21 (!) 180/104  10/17/19 (!) 173/93  10/14/19 (!) 164/93   Wt Readings from Last 3 Encounters:  11/15/21 256 lb 12.8 oz (116.5 kg)  10/17/19 264 lb 3.2 oz (119.8 kg)  10/14/19 261 lb (118.4 kg)      Physical Exam Vitals reviewed.  Constitutional:      General: He is not in acute distress.    Appearance: Normal appearance. He is not diaphoretic.  HENT:     Head: Normocephalic and atraumatic.  Eyes:     General: No scleral icterus.    Conjunctiva/sclera: Conjunctivae normal.  Cardiovascular:     Rate and Rhythm: Normal rate and regular rhythm.     Pulses: Normal pulses.     Heart sounds: Normal heart sounds. No murmur heard. Pulmonary:     Effort: Pulmonary effort is normal. No respiratory distress.     Breath sounds: Normal breath sounds. No wheezing or rhonchi.  Musculoskeletal:     Cervical back: Neck supple.     Right lower leg: No edema.     Left lower leg: No edema.  Lymphadenopathy:     Cervical: No cervical adenopathy.  Skin:    General: Skin is warm and dry.     Findings: No rash.  Neurological:     Mental Status: He is alert and oriented to person, place, and time.  Psychiatric:        Mood and Affect: Mood normal.        Behavior: Behavior normal.      No results found for any visits on 11/15/21.  Assessment & Plan     Problem List Items Addressed This Visit       Cardiovascular and Mediastinum   Essential hypertension - Primary    Chronic and uncontrolled Continue amlodipine 10mg  daily and HCTZ 25mg  daily Add losartan 50 mg daily Recheck CMP F/u in 1 month      Relevant Medications   losartan (COZAAR) 50 MG tablet   Other Relevant Orders   Comprehensive metabolic panel     Other   Hyperlipidemia    Previously well controlled Continue statin Repeat FLP and CMP       Relevant Medications   losartan (COZAAR) 50 MG tablet   Other Relevant Orders   Comprehensive metabolic panel   Lipid panel   Obesity    Discussed importance of healthy weight management Discussed diet and exercise       Other Visit Diagnoses     Hyperglycemia       Relevant Orders   Hemoglobin A1c        Return in about 4 weeks (around 12/13/2021) for BP f/u.      I, Lavon Paganini, MD, have reviewed all documentation for this visit. The documentation on 11/15/21  for the exam, diagnosis, procedures, and orders are all accurate and complete.   Amarion Portell, Dionne Bucy, MD, MPH [employer]

## 2021-11-15 ENCOUNTER — Other Ambulatory Visit: Payer: Self-pay

## 2021-11-15 ENCOUNTER — Encounter: Payer: Self-pay | Admitting: Family Medicine

## 2021-11-15 ENCOUNTER — Ambulatory Visit (INDEPENDENT_AMBULATORY_CARE_PROVIDER_SITE_OTHER): Payer: Medicare HMO | Admitting: Family Medicine

## 2021-11-15 VITALS — BP 180/104 | Temp 98.0°F | Resp 16 | Ht 72.0 in | Wt 256.8 lb

## 2021-11-15 DIAGNOSIS — R739 Hyperglycemia, unspecified: Secondary | ICD-10-CM | POA: Diagnosis not present

## 2021-11-15 DIAGNOSIS — Z6834 Body mass index (BMI) 34.0-34.9, adult: Secondary | ICD-10-CM | POA: Diagnosis not present

## 2021-11-15 DIAGNOSIS — I10 Essential (primary) hypertension: Secondary | ICD-10-CM

## 2021-11-15 DIAGNOSIS — E669 Obesity, unspecified: Secondary | ICD-10-CM

## 2021-11-15 DIAGNOSIS — E782 Mixed hyperlipidemia: Secondary | ICD-10-CM

## 2021-11-15 MED ORDER — LOSARTAN POTASSIUM 50 MG PO TABS: 50.0000 mg | ORAL_TABLET | Freq: Every day | ORAL | 3 refills | Status: DC

## 2021-11-15 NOTE — Assessment & Plan Note (Signed)
Previously well controlled Continue statin Repeat FLP and CMP  

## 2021-11-15 NOTE — Assessment & Plan Note (Signed)
Discussed importance of healthy weight management Discussed diet and exercise  

## 2021-11-15 NOTE — Assessment & Plan Note (Signed)
Chronic and uncontrolled Continue amlodipine 10mg  daily and HCTZ 25mg  daily Add losartan 50 mg daily Recheck CMP F/u in 1 month

## 2021-11-16 LAB — LIPID PANEL
Chol/HDL Ratio: 4.6 ratio (ref 0.0–5.0)
Cholesterol, Total: 190 mg/dL (ref 100–199)
HDL: 41 mg/dL (ref >39)
LDL Chol Calc (NIH): 107 mg/dL — ABNORMAL HIGH (ref 0–99)
Triglycerides: 241 mg/dL — ABNORMAL HIGH (ref 0–149)
VLDL Cholesterol Cal: 42 mg/dL — ABNORMAL HIGH (ref 5–40)

## 2021-11-16 LAB — COMPREHENSIVE METABOLIC PANEL
ALT: 12 IU/L (ref 0–44)
AST: 17 IU/L (ref 0–40)
Albumin/Globulin Ratio: 1.3 (ref 1.2–2.2)
Albumin: 4.4 g/dL (ref 3.8–4.8)
Alkaline Phosphatase: 77 IU/L (ref 44–121)
BUN/Creatinine Ratio: 8 — ABNORMAL LOW (ref 10–24)
BUN: 12 mg/dL (ref 8–27)
Bilirubin Total: 0.4 mg/dL (ref 0.0–1.2)
CO2: 22 mmol/L (ref 20–29)
Calcium: 9.8 mg/dL (ref 8.6–10.2)
Chloride: 102 mmol/L (ref 96–106)
Creatinine, Ser: 1.48 mg/dL — ABNORMAL HIGH (ref 0.76–1.27)
Globulin, Total: 3.3 g/dL (ref 1.5–4.5)
Glucose: 124 mg/dL — ABNORMAL HIGH (ref 70–99)
Potassium: 4.8 mmol/L (ref 3.5–5.2)
Sodium: 141 mmol/L (ref 134–144)
Total Protein: 7.7 g/dL (ref 6.0–8.5)
eGFR: 51 mL/min/{1.73_m2} — ABNORMAL LOW (ref >59)

## 2021-11-16 LAB — HEMOGLOBIN A1C
Est. average glucose Bld gHb Est-mCnc: 128 mg/dL
Hgb A1c MFr Bld: 6.1 % — ABNORMAL HIGH (ref 4.8–5.6)

## 2021-11-18 ENCOUNTER — Telehealth: Payer: Self-pay | Admitting: Family Medicine

## 2021-11-18 MED ORDER — AMLODIPINE BESYLATE 10 MG PO TABS: 10.0000 mg | ORAL_TABLET | Freq: Every day | ORAL | 1 refills | Status: DC

## 2021-11-18 MED ORDER — ROSUVASTATIN CALCIUM 20 MG PO TABS: 20.0000 mg | ORAL_TABLET | Freq: Every day | ORAL | 1 refills | Status: DC

## 2021-11-18 MED ORDER — HYDROCHLOROTHIAZIDE 25 MG PO TABS: 25.0000 mg | ORAL_TABLET | Freq: Every day | ORAL | 1 refills | Status: DC

## 2021-11-18 NOTE — Telephone Encounter (Signed)
Pt called to report that he is missing refills following his recent OV.  ?rosuvastatin (CRESTOR) 20 MG tablet  ?amLODipine (NORVASC) 10 MG tablet ?hydrochlorothiazide (HYDRODIURIL) 25 MG tablet  ? ?[id number] [address], [place]  ?[place] [address]  ?Phone: [phone] Fax: [phone]  ? ?

## 2021-11-24 ENCOUNTER — Ambulatory Visit: Payer: Medicare HMO | Admitting: Dermatology

## 2021-11-24 ENCOUNTER — Other Ambulatory Visit: Payer: Self-pay

## 2021-11-24 DIAGNOSIS — B079 Viral wart, unspecified: Secondary | ICD-10-CM

## 2021-11-24 NOTE — Patient Instructions (Signed)

## 2021-11-24 NOTE — Progress Notes (Signed)
? ?  Follow-Up Visit ?  ?Subjective  ?Tyler [name] is a 71 y.o. male who presents for the following: Warts (R prox nasal ala rim - bx proven, tx with Ln2 once since biopsy, but lesion remains chronic and persistent with recurrence and no improvement.). ? ?The following portions of the chart were reviewed this encounter and updated as appropriate:  ? Tobacco  Allergies  Meds  Problems  Med Hx  Surg Hx  Fam Hx   ?  ?Review of Systems:  No other skin or systemic complaints except as noted in HPI or Assessment and Plan. ? ?Objective  ?Well appearing patient in no apparent distress; mood and affect are within normal limits. ? ?A focused examination was performed including the face. Relevant physical exam findings are noted in the Assessment and Plan. ? ?R prox nasal alar rim x 1   0.6 cm ?Verrucous papules -- Discussed viral etiology and contagion.  ? ? ?Assessment & Plan  ?Viral warts, unspecified type ?R prox nasal alar rim x 1 ? ?Bx proven  ? ?Discussed viral etiology and risk of spread.  Discussed multiple treatments may be required to clear warts.  Discussed possible post-treatment dyspigmentation and risk of recurrence. ? ?Epidermal / dermal shaving - R prox nasal alar rim x 1 ?0.6 cm ? ?Informed consent: discussed and consent obtained   ?Timeout: patient name, date of birth, surgical site, and procedure verified   ?Procedure prep:  Patient was prepped and draped in usual sterile fashion ?Prep type:  Isopropyl alcohol ?Anesthesia: the lesion was anesthetized in a standard fashion   ?Anesthetic:  1% lidocaine w/ epinephrine 1-100,000 buffered w/ 8.4% NaHCO3 ?Instrument used: flexible razor blade   ?Hemostasis achieved with: pressure, aluminum chloride and electrodesiccation   ?Outcome: patient tolerated procedure well   ?Post-procedure details: sterile dressing applied and wound care instructions given   ?Dressing type: bandage and petrolatum   ? ?Destruction of lesion - R prox nasal alar rim x  1 ?Complexity: simple   ?Destruction method: cryotherapy   ?Informed consent: discussed and consent obtained   ?Timeout:  patient name, date of birth, surgical site, and procedure verified ?Lesion destroyed using liquid nitrogen: Yes   ?Region frozen until ice ball extended beyond lesion: Yes   ?Outcome: patient tolerated procedure well with no complications   ?Post-procedure details: wound care instructions given   ? ?Intralesional injection - R prox nasal alar rim x 1 ?Location: R prox nasal ala rim ? ?Informed Consent: Discussed risks (infection, pain, bleeding, bruising, thinning of the skin, loss of skin pigment, lack of resolution, and recurrence of lesion) and benefits of the procedure, as well as the alternatives. Informed consent was obtained. ?Preparation: The area was prepared a standard fashion. ? ?Procedure Details: An intralesional injection was performed with candida antigen. 0.05 cc in total were injected. ? ?Total number of injections: 1 ? ?Plan: The patient was instructed on post-op care. Recommend OTC analgesia as needed for pain. ? ?Return in about 6 weeks (around 01/05/2022) for wart follow up . ? ?IRudell Cobb, CMA, am acting as scribe for Sarina Ser, MD . ?Documentation: I have reviewed the above documentation for accuracy and completeness, and I agree with the above. ? ?Sarina Ser, MD ? ?

## 2021-11-28 ENCOUNTER — Encounter: Payer: Self-pay | Admitting: Dermatology

## 2022-01-05 ENCOUNTER — Ambulatory Visit: Payer: Medicare HMO | Admitting: Dermatology

## 2022-01-05 DIAGNOSIS — B078 Other viral warts: Secondary | ICD-10-CM

## 2022-01-05 NOTE — Progress Notes (Signed)
? ?  Follow-Up Visit ?  ?Subjective  ?Tyler [name] is a 71 y.o. male who presents for the following: Warts (6 weeks f/u at right prox nasal alar rim bx proven wart 07/14/21, treated with shave removal,  LN2 and Candida with a fair response, this will be 2nd Candida injection. ). ? ?The following portions of the chart were reviewed this encounter and updated as appropriate:  ? Tobacco  Allergies  Meds  Problems  Med Hx  Surg Hx  Fam Hx   ?  ?Review of Systems:  No other skin or systemic complaints except as noted in HPI or Assessment and Plan. ? ?Objective  ?Well appearing patient in no apparent distress; mood and affect are within normal limits. ? ?A focused examination was performed including nose . Relevant physical exam findings are noted in the Assessment and Plan. ? ?right prox nasal alar rim ?Verrucous papules -- Discussed viral etiology and contagion.  ? ? ?Assessment & Plan  ?Other viral warts ?right prox nasal alar rim ? ?Discussed viral etiology and risk of spread.  Discussed multiple treatments may be required to clear warts.  Discussed possible post-treatment dyspigmentation and risk of recurrence.  ? ?Destruction of lesion - right prox nasal alar rim ?Complexity: simple   ?Destruction method: cryotherapy   ?Informed consent: discussed and consent obtained   ?Timeout:  patient name, date of birth, surgical site, and procedure verified ?Lesion destroyed using liquid nitrogen: Yes   ?Region frozen until ice ball extended beyond lesion: Yes   ?Outcome: patient tolerated procedure well with no complications   ?Post-procedure details: wound care instructions given   ? ?Intralesional injection - right prox nasal alar rim ?Location: right prox nasal alar rim ? ?Informed Consent: Discussed risks (infection, pain, bleeding, bruising, thinning of the skin, loss of skin pigment, lack of resolution, and recurrence of lesion) and benefits of the procedure, as well as the alternatives. Informed consent was  obtained. ?Preparation: The area was prepared a standard fashion. ? ?Procedure Details: An intralesional injection was performed with candida antigen. 0.1 cc in total were injected. ? ?Total number of injections: 1 ? ?Plan: The patient was instructed on post-op care. Recommend OTC analgesia as needed for pain. ? ?Return in about 6 weeks (around 02/16/2022) for TBSE, wart . ? ?I, Marye Round, CMA, am acting as scribe for Sarina Ser, MD .  ?Documentation: I have reviewed the above documentation for accuracy and completeness, and I agree with the above. ? ?Sarina Ser, MD ? ?

## 2022-01-05 NOTE — Patient Instructions (Addendum)

## 2022-01-14 ENCOUNTER — Encounter: Payer: Self-pay | Admitting: Dermatology

## 2022-01-18 ENCOUNTER — Ambulatory Visit: Payer: Medicare HMO | Admitting: Family Medicine

## 2022-01-19 ENCOUNTER — Ambulatory Visit: Payer: Medicare HMO | Admitting: Dermatology

## 2022-02-08 ENCOUNTER — Ambulatory Visit: Payer: Medicare HMO | Admitting: Family Medicine

## 2022-02-13 ENCOUNTER — Other Ambulatory Visit: Payer: Self-pay | Admitting: Family Medicine

## 2022-03-03 ENCOUNTER — Ambulatory Visit: Payer: Medicare HMO | Admitting: Dermatology

## 2022-04-04 ENCOUNTER — Ambulatory Visit: Payer: Medicare HMO | Admitting: Family Medicine

## 2022-05-03 ENCOUNTER — Ambulatory Visit: Payer: Medicare HMO | Admitting: Dermatology

## 2022-05-03 DIAGNOSIS — L578 Other skin changes due to chronic exposure to nonionizing radiation: Secondary | ICD-10-CM | POA: Diagnosis not present

## 2022-05-03 DIAGNOSIS — L821 Other seborrheic keratosis: Secondary | ICD-10-CM

## 2022-05-03 DIAGNOSIS — D239 Other benign neoplasm of skin, unspecified: Secondary | ICD-10-CM

## 2022-05-03 DIAGNOSIS — Z86018 Personal history of other benign neoplasm: Secondary | ICD-10-CM | POA: Diagnosis not present

## 2022-05-03 DIAGNOSIS — B079 Viral wart, unspecified: Secondary | ICD-10-CM

## 2022-05-03 DIAGNOSIS — L814 Other melanin hyperpigmentation: Secondary | ICD-10-CM

## 2022-05-03 DIAGNOSIS — Z1283 Encounter for screening for malignant neoplasm of skin: Secondary | ICD-10-CM | POA: Diagnosis not present

## 2022-05-03 DIAGNOSIS — D229 Melanocytic nevi, unspecified: Secondary | ICD-10-CM | POA: Diagnosis not present

## 2022-05-03 NOTE — Progress Notes (Signed)
   Follow-Up Visit   Subjective  Tyler [name] is a 71 y.o. male who presents for the following: Warts (Bx proven, R prox nasal alar rim, 6wk f/u, LN2 and Candida injections x 2) and Total body skin exam (Hx of multiple dysplastic nevi). The patient presents for Total-Body Skin Exam (TBSE) for skin cancer screening and mole check.  The patient has spots, moles and lesions to be evaluated, some may be new or changing and the patient has concerns that these could be cancer.   The following portions of the chart were reviewed this encounter and updated as appropriate:   Tobacco  Allergies  Meds  Problems  Med Hx  Surg Hx  Fam Hx     Review of Systems:  No other skin or systemic complaints except as noted in HPI or Assessment and Plan.  Objective  Well appearing patient in no apparent distress; mood and affect are within normal limits.  A full examination was performed including scalp, head, eyes, ears, nose, lips, neck, chest, axillae, abdomen, back, buttocks, bilateral upper extremities, bilateral lower extremities, hands, feet, fingers, toes, fingernails, and toenails. All findings within normal limits unless otherwise noted below.  R proximal nasal alar rim R prox nasal alar rim clear   Assessment & Plan   Lentigines - Scattered tan macules - Due to sun exposure - Benign-appearing, observe - Recommend daily broad spectrum sunscreen SPF 30+ to sun-exposed areas, reapply every 2 hours as needed. - Call for any changes - upper back  Seborrheic Keratoses - Stuck-on, waxy, tan-brown papules and/or plaques  - Benign-appearing - Discussed benign etiology and prognosis. - Observe - Call for any changes - trunk  Melanocytic Nevi - Tan-brown and/or pink-flesh-colored symmetric macules and papules - Benign appearing on exam today - Observation - Call clinic for new or changing moles - Recommend daily use of broad spectrum spf 30+ sunscreen to sun-exposed areas.  -  trunk  Actinic Damage - Chronic condition, secondary to cumulative UV/sun exposure - diffuse scaly erythematous macules with underlying dyspigmentation - Recommend daily broad spectrum sunscreen SPF 30+ to sun-exposed areas, reapply every 2 hours as needed.  - Staying in the shade or wearing long sleeves, sun glasses (UVA+UVB protection) and wide brim hats (4-inch brim around the entire circumference of the hat) are also recommended for sun protection.  - Call for new or changing lesions.  Skin cancer screening performed today.   History of Dysplastic Nevi - No evidence of recurrence today - Recommend regular full body skin exams - Recommend daily broad spectrum sunscreen SPF 30+ to sun-exposed areas, reapply every 2 hours as needed.  - Call if any new or changing lesions are noted between office visits  - multiple  Viral warts, R proximal nasal alar rim Bx proven Resolved with Candida x 2 treatments and LN2 Observe for recurrence.  Return in about 6 months (around 11/03/2022) for UBSE, Hx of Dysplastic nevi.  I, Othelia Pulling, RMA, am acting as scribe for Sarina Ser, MD . Documentation: I have reviewed the above documentation for accuracy and completeness, and I agree with the above.  Sarina Ser, MD

## 2022-05-03 NOTE — Patient Instructions (Signed)
Due to recent changes in healthcare laws, you may see results of your pathology and/or laboratory studies on MyChart before the doctors have had a chance to review them. We understand that in some cases there may be results that are confusing or concerning to you. Please understand that not all results are received at the same time and often the doctors may need to interpret multiple results in order to provide you with the best plan of care or course of treatment. Therefore, we ask that you please give us 2 business days to thoroughly review all your results before contacting the office for clarification. Should we see a critical lab result, you will be contacted sooner.   If You Need Anything After Your Visit  If you have any questions or concerns for your doctor, please call our main line at [phone] and press option 4 to reach your doctor's medical assistant. If no one answers, please leave a voicemail as directed and we will return your call as soon as possible. Messages left after 4 pm will be answered the following business day.   You may also send us a message via MyChart. We typically respond to MyChart messages within 1-2 business days.  For prescription refills, please ask your pharmacy to contact our office. Our fax number is [phone].  If you have an urgent issue when the clinic is closed that cannot wait until the next business day, you can page your doctor at the number below.    Please note that while we do our best to be available for urgent issues outside of office hours, we are not available 24/7.   If you have an urgent issue and are unable to reach us, you may choose to seek medical care at your doctor's office, retail clinic, urgent care center, or emergency room.  If you have a medical emergency, please immediately call 911 or go to the emergency department.  Pager Numbers  - Dr. Kowalski: [phone]  - Dr. Moye: [phone]  - Dr. Stewart:  [phone]  In the event of inclement weather, please call our main line at [phone] for an update on the status of any delays or closures.  Dermatology Medication Tips: Please keep the boxes that topical medications come in in order to help keep track of the instructions about where and how to use these. Pharmacies typically print the medication instructions only on the boxes and not directly on the medication tubes.   If your medication is too expensive, please contact our office at [phone] option 4 or send us a message through MyChart.   We are unable to tell what your co-pay for medications will be in advance as this is different depending on your insurance coverage. However, we may be able to find a substitute medication at lower cost or fill out paperwork to get insurance to cover a needed medication.   If a prior authorization is required to get your medication covered by your insurance company, please allow us 1-2 business days to complete this process.  Drug prices often vary depending on where the prescription is filled and some pharmacies may offer cheaper prices.  The website www.goodrx.com contains coupons for medications through different pharmacies. The prices here do not account for what the cost may be with help from insurance (it may be cheaper with your insurance), but the website can give you the price if you did not use any insurance.  - You can print the associated coupon and take it with   your prescription to the pharmacy.  - You may also stop by our office during regular business hours and pick up a GoodRx coupon card.  - If you need your prescription sent electronically to a different pharmacy, notify our office through [place] MyChart or by phone at [phone] option 4.     Si Usted Necesita Algo Despus de Su Visita  Tambin puede enviarnos un mensaje a travs de MyChart. Por lo general respondemos a los mensajes de MyChart en el transcurso de 1 a 2  das hbiles.  Para renovar recetas, por favor pida a su farmacia que se ponga en contacto con nuestra oficina. Nuestro nmero de fax es el [phone].  Si tiene un asunto urgente cuando la clnica est cerrada y que no puede esperar hasta el siguiente da hbil, puede llamar/localizar a su doctor(a) al nmero que aparece a continuacin.   Por favor, tenga en cuenta que aunque hacemos todo lo posible para estar disponibles para asuntos urgentes fuera del horario de oficina, no estamos disponibles las 24 horas del da, los 7 das de la semana.   Si tiene un problema urgente y no puede comunicarse con nosotros, puede optar por buscar atencin mdica  en el consultorio de su doctor(a), en una clnica privada, en un centro de atencin urgente o en una sala de emergencias.  Si tiene una emergencia mdica, por favor llame inmediatamente al 911 o vaya a la sala de emergencias.  Nmeros de bper  - Dr. Kowalski: [phone]  - Dra. Moye: [phone]  - Dra. Stewart: [phone]  En caso de inclemencias del tiempo, por favor llame a nuestra lnea principal al [phone] para una actualizacin sobre el estado de cualquier retraso o cierre.  Consejos para la medicacin en dermatologa: Por favor, guarde las cajas en las que vienen los medicamentos de uso tpico para ayudarle a seguir las instrucciones sobre dnde y cmo usarlos. Las farmacias generalmente imprimen las instrucciones del medicamento slo en las cajas y no directamente en los tubos del medicamento.   Si su medicamento es muy caro, por favor, pngase en contacto con nuestra oficina llamando al [phone] y presione la opcin 4 o envenos un mensaje a travs de MyChart.   No podemos decirle cul ser su copago por los medicamentos por adelantado ya que esto es diferente dependiendo de la cobertura de su seguro. Sin embargo, es posible que podamos encontrar un medicamento sustituto a menor costo o llenar un formulario para que el  seguro cubra el medicamento que se considera necesario.   Si se requiere una autorizacin previa para que su compaa de seguros cubra su medicamento, por favor permtanos de 1 a 2 das hbiles para completar este proceso.  Los precios de los medicamentos varan con frecuencia dependiendo del lugar de dnde se surte la receta y alguna farmacias pueden ofrecer precios ms baratos.  El sitio web www.goodrx.com tiene cupones para medicamentos de diferentes farmacias. Los precios aqu no tienen en cuenta lo que podra costar con la ayuda del seguro (puede ser ms barato con su seguro), pero el sitio web puede darle el precio si no utiliz ningn seguro.  - Puede imprimir el cupn correspondiente y llevarlo con su receta a la farmacia.  - Tambin puede pasar por nuestra oficina durante el horario de atencin regular y recoger una tarjeta de cupones de GoodRx.  - Si necesita que su receta se enve electrnicamente a una farmacia diferente, informe a nuestra oficina a travs de MyChart de [place]   o por telfono llamando al [phone] y presione la opcin 4.  

## 2022-05-10 ENCOUNTER — Encounter: Payer: Self-pay | Admitting: Dermatology

## 2022-07-01 NOTE — Patient Instructions (Signed)
   The CDC recommends two doses of Shingrix (the shingles vaccine) separated by 2 to 6 months for adults age 69 years and older. I recommend checking with your insurance plan regarding coverage for this vaccine.   

## 2022-07-01 NOTE — Progress Notes (Unsigned)
I,Sedric Guia S Shellby Schlink,acting as a Education administrator for Lavon Paganini, MD.,have documented all relevant documentation on the behalf of Lavon Paganini, MD,as directed by  Lavon Paganini, MD while in the presence of Lavon Paganini, MD.    Annual Wellness Visit     Patient: Tyler [name], Male    DOB: June 12, 1951, 71 y.o.   MRN: [id number] Visit Date: 07/04/2022  Today's Provider: Lavon Paganini, MD   No chief complaint on file.  Subjective    Tyler [name] is a 71 y.o. male who presents today for his Annual Wellness Visit. He reports consuming a {diet types:17450} diet. {Exercise:19826} He generally feels {well/fairly well/poorly:18703}. He reports sleeping {well/fairly well/poorly:18703}. He {does/does not:200015} have additional problems to discuss today.   HPI   Medications: Outpatient Medications Prior to Visit  Medication Sig   amLODipine (NORVASC) 10 MG tablet Take 1 tablet (10 mg total) by mouth daily.   hydrochlorothiazide (HYDRODIURIL) 25 MG tablet Take 1 tablet (25 mg total) by mouth daily.   losartan (COZAAR) 50 MG tablet TAKE 1 TABLET BY MOUTH EVERY DAY   rosuvastatin (CRESTOR) 20 MG tablet Take 1 tablet (20 mg total) by mouth daily.   No facility-administered medications prior to visit.    No Known Allergies  Patient Care Team: Virginia Crews, MD as PCP - General (Family Medicine) Ralene Bathe, MD (Dermatology) Odette Fraction [facility])  Review of Systems  All other systems reviewed and are negative.   Last CBC Lab Results  Component Value Date   WBC 8.9 09/29/2019   HGB 16.1 09/29/2019   HCT 47.4 09/29/2019   MCV 92.4 09/29/2019   MCH 31.4 09/29/2019   RDW 13.0 09/29/2019   PLT 242 55/97/4163   Last metabolic panel Lab Results  Component Value Date   GLUCOSE 124 (H) 11/15/2021   NA 141 11/15/2021   K 4.8 11/15/2021   CL 102 11/15/2021   CO2 22 11/15/2021   BUN 12 11/15/2021   CREATININE 1.48 (H) 11/15/2021   EGFR 51 (L)  11/15/2021   CALCIUM 9.8 11/15/2021   PROT 7.7 11/15/2021   ALBUMIN 4.4 11/15/2021   LABGLOB 3.3 11/15/2021   AGRATIO 1.3 11/15/2021   BILITOT 0.4 11/15/2021   ALKPHOS 77 11/15/2021   AST 17 11/15/2021   ALT 12 11/15/2021   ANIONGAP 12 09/29/2019   Last lipids Lab Results  Component Value Date   CHOL 190 11/15/2021   HDL 41 11/15/2021   LDLCALC 107 (H) 11/15/2021   TRIG 241 (H) 11/15/2021   CHOLHDL 4.6 11/15/2021   Last hemoglobin A1c Lab Results  Component Value Date   HGBA1C 6.1 (H) 11/15/2021   Last thyroid functions No results found for: "TSH", "T3TOTAL", "T4TOTAL", "THYROIDAB" Last vitamin D No results found for: "25OHVITD2", "25OHVITD3", "VD25OH" Last vitamin B12 and Folate No results found for: "VITAMINB12", "FOLATE"      Objective    Vitals: There were no vitals taken for this visit. BP Readings from Last 3 Encounters:  11/15/21 (!) 180/104  10/17/19 (!) 173/93  10/14/19 (!) 164/93   Wt Readings from Last 3 Encounters:  11/15/21 256 lb 12.8 oz (116.5 kg)  10/17/19 264 lb 3.2 oz (119.8 kg)  10/14/19 261 lb (118.4 kg)    Physical Exam ***  Most recent functional status assessment:    11/15/2021    8:07 AM  In your present state of health, do you have any difficulty performing the following activities:  Hearing? 0  Vision? 0  Difficulty  concentrating or making decisions? 0  Walking or climbing stairs? 0  Dressing or bathing? 0  Doing errands, shopping? 0   Most recent fall risk assessment:    11/15/2021    8:08 AM  Fall Risk   Falls in the past year? 0  Number falls in past yr: 0  Injury with Fall? 0  Risk for fall due to : No Fall Risks  Follow up Falls evaluation completed    Most recent depression screenings:    11/15/2021    8:07 AM 09/28/2021    8:33 AM  PHQ 2/9 Scores  PHQ - 2 Score 0 0  PHQ- 9 Score 0    Most recent cognitive screening:    09/03/2018   10:56 AM  6CIT Screen  What Year? 0 points  What month? 0 points   What time? 0 points  Count back from 20 0 points  Months in reverse 0 points  Repeat phrase 0 points  Total Score 0 points   Most recent Audit-C alcohol use screening    11/15/2021    8:07 AM  Alcohol Use Disorder Test (AUDIT)  1. How often do you have a drink containing alcohol? 1  2. How many drinks containing alcohol do you have on a typical day when you are drinking? 2  3. How often do you have six or more drinks on one occasion? 0  AUDIT-C Score 3  4. How often during the last year have you found that you were not able to stop drinking once you had started? 0  5. How often during the last year have you failed to do what was normally expected from you because of drinking? 0  6. How often during the last year have you needed a first drink in the morning to get yourself going after a heavy drinking session? 0  7. How often during the last year have you had a feeling of guilt of remorse after drinking? 0  8. How often during the last year have you been unable to remember what happened the night before because you had been drinking? 0  9. Have you or someone else been injured as a result of your drinking? 0  10. Has a relative or friend or a doctor or another health worker been concerned about your drinking or suggested you cut down? 0  Alcohol Use Disorder Identification Test Final Score (AUDIT) 3   A score of 3 or more in women, and 4 or more in men indicates increased risk for alcohol abuse, EXCEPT if all of the points are from question 1   No results found for any visits on 07/04/22.  Assessment & Plan     Annual wellness visit done today including the all of the following: Reviewed patient's Family Medical History Reviewed and updated list of patient's medical providers Assessment of cognitive impairment was done Assessed patient's functional ability Established a written schedule for health screening [place] Completed and Reviewed  Exercise  Activities and Dietary recommendations  Goals      DIET - EAT MORE FRUITS AND VEGETABLES     DIET - REDUCE SUGAR INTAKE     Recommend to cut back on sugar foods and sweets to a couple times a week versus daily.         Immunization History  Administered Date(s) Administered   Influenza, High Dose Seasonal PF 07/19/2018, 05/28/2019   Influenza-Unspecified 06/30/2021   Pneumococcal Conjugate-13 07/19/2018   Pneumococcal Polysaccharide-23 10/17/2019  Td 03/04/2008   Tdap 05/11/2015    Health Maintenance  Topic Date Due   COVID-19 Vaccine (1) Never done   Zoster Vaccines- Shingrix (1 of 2) Never done   INFLUENZA VACCINE  04/19/2022   COLONOSCOPY (Pts 45-40yr Insurance coverage will need to be confirmed)  10/10/2023   TETANUS/TDAP  05/10/2025   Pneumonia Vaccine 71 Years old  Completed   Hepatitis C Screening  Completed   HPV VACCINES  Aged Out     Discussed health benefits of physical activity, and encouraged him to engage in regular exercise appropriate for his age and condition.    ***  No follow-ups on file.     {provider attestation***:1}   ALavon Paganini MD  [phone](phone) [phone](fax)  [place]

## 2022-07-04 ENCOUNTER — Ambulatory Visit (INDEPENDENT_AMBULATORY_CARE_PROVIDER_SITE_OTHER): Payer: Medicare HMO | Admitting: Family Medicine

## 2022-07-04 ENCOUNTER — Encounter: Payer: Self-pay | Admitting: Family Medicine

## 2022-07-04 VITALS — BP 162/91 | HR 65 | Temp 97.9°F | Resp 16 | Ht 72.0 in | Wt 250.0 lb

## 2022-07-04 DIAGNOSIS — E782 Mixed hyperlipidemia: Secondary | ICD-10-CM

## 2022-07-04 DIAGNOSIS — Z0000 Encounter for general adult medical examination without abnormal findings: Secondary | ICD-10-CM | POA: Diagnosis not present

## 2022-07-04 DIAGNOSIS — I10 Essential (primary) hypertension: Secondary | ICD-10-CM | POA: Diagnosis not present

## 2022-07-04 DIAGNOSIS — R7303 Prediabetes: Secondary | ICD-10-CM | POA: Diagnosis not present

## 2022-07-04 DIAGNOSIS — Z23 Encounter for immunization: Secondary | ICD-10-CM

## 2022-07-04 DIAGNOSIS — E669 Obesity, unspecified: Secondary | ICD-10-CM | POA: Diagnosis not present

## 2022-07-04 DIAGNOSIS — Z6834 Body mass index (BMI) 34.0-34.9, adult: Secondary | ICD-10-CM

## 2022-07-04 DIAGNOSIS — R739 Hyperglycemia, unspecified: Secondary | ICD-10-CM

## 2022-07-04 MED ORDER — LOSARTAN POTASSIUM 100 MG PO TABS: 100.0000 mg | ORAL_TABLET | Freq: Every day | ORAL | 1 refills | Status: DC

## 2022-07-04 NOTE — Assessment & Plan Note (Signed)
Previously well controlled Continue statin Repeat FLP and CMP  

## 2022-07-04 NOTE — Assessment & Plan Note (Addendum)
Recommend low carb diet °Recheck A1c  °

## 2022-07-04 NOTE — Assessment & Plan Note (Signed)
Discussed importance of healthy weight management Discussed diet and exercise  

## 2022-07-04 NOTE — Assessment & Plan Note (Signed)
Chronic and uncontrolled Increase losartan to 100 mg daily Continue amlodipine and hctz at current dose Recheck CMP

## 2022-07-05 LAB — LIPID PANEL WITH LDL/HDL RATIO
Cholesterol, Total: 281 mg/dL — ABNORMAL HIGH (ref 100–199)
HDL: 38 mg/dL — ABNORMAL LOW (ref >39)
LDL Chol Calc (NIH): 152 mg/dL — ABNORMAL HIGH (ref 0–99)
LDL/HDL Ratio: 4 ratio — ABNORMAL HIGH (ref 0.0–3.6)
Triglycerides: 476 mg/dL — ABNORMAL HIGH (ref 0–149)
VLDL Cholesterol Cal: 91 mg/dL — ABNORMAL HIGH (ref 5–40)

## 2022-07-05 LAB — COMPREHENSIVE METABOLIC PANEL
ALT: 18 IU/L (ref 0–44)
AST: 18 IU/L (ref 0–40)
Albumin/Globulin Ratio: 1.4 (ref 1.2–2.2)
Albumin: 4.8 g/dL (ref 3.9–4.9)
Alkaline Phosphatase: 78 IU/L (ref 44–121)
BUN/Creatinine Ratio: 11 (ref 10–24)
BUN: 16 mg/dL (ref 8–27)
Bilirubin Total: 0.3 mg/dL (ref 0.0–1.2)
CO2: 24 mmol/L (ref 20–29)
Calcium: 10.3 mg/dL — ABNORMAL HIGH (ref 8.6–10.2)
Chloride: 98 mmol/L (ref 96–106)
Creatinine, Ser: 1.5 mg/dL — ABNORMAL HIGH (ref 0.76–1.27)
Globulin, Total: 3.5 g/dL (ref 1.5–4.5)
Glucose: 115 mg/dL — ABNORMAL HIGH (ref 70–99)
Potassium: 5.6 mmol/L — ABNORMAL HIGH (ref 3.5–5.2)
Sodium: 137 mmol/L (ref 134–144)
Total Protein: 8.3 g/dL (ref 6.0–8.5)
eGFR: 50 mL/min/{1.73_m2} — ABNORMAL LOW (ref >59)

## 2022-07-05 LAB — HEMOGLOBIN A1C
Est. average glucose Bld gHb Est-mCnc: 131 mg/dL
Hgb A1c MFr Bld: 6.2 % — ABNORMAL HIGH (ref 4.8–5.6)

## 2022-08-29 NOTE — Progress Notes (Unsigned)
I,Luciana Cammarata S Matheson Vandehei,acting as a scribe for Lavon Paganini, MD.,have documented all relevant documentation on the behalf of Lavon Paganini, MD,as directed by  Lavon Paganini, MD while in the presence of Lavon Paganini, MD.     Established patient visit   Patient: Tyler [name]   DOB: 05-29-1951   71 y.o. Male  MRN: [id number] Visit Date: 08/30/2022  Today's healthcare provider: Lavon Paganini, MD   Chief Complaint  Patient presents with   Hypertension   Subjective    HPI  Follow up for hypertension  The patient was last seen for this 6 weeks ago. Changes made at last visit include increase losartan to 100 mg daily.  He reports excellent compliance with treatment. He feels that condition is Improved. He is not having side effects.  Had some dizziness for the first week after increasing dose but better now. BP Readings from Last 3 Encounters:  08/30/22 (!) 146/78  07/04/22 (!) 162/91  11/15/21 (!) 180/104    -----------------------------------------------------------------------------------------   Medications: Outpatient Medications Prior to Visit  Medication Sig   amLODipine (NORVASC) 10 MG tablet Take 1 tablet (10 mg total) by mouth daily.   hydrochlorothiazide (HYDRODIURIL) 25 MG tablet Take 1 tablet (25 mg total) by mouth daily.   losartan (COZAAR) 100 MG tablet Take 1 tablet (100 mg total) by mouth daily.   rosuvastatin (CRESTOR) 20 MG tablet Take 1 tablet (20 mg total) by mouth daily.   No facility-administered medications prior to visit.    Review of Systems  Constitutional:  Negative for appetite change.  Eyes:  Negative for visual disturbance.  Respiratory:  Negative for chest tightness and shortness of breath.   Cardiovascular:  Negative for chest pain and leg swelling.  Gastrointestinal:  Negative for abdominal pain, nausea and vomiting.  Neurological:  Negative for dizziness, light-headedness and headaches.       Objective    BP  (!) 146/78 (BP Location: Left Arm, Cuff Size: Large)   Pulse 66   Temp 98 F (36.7 C) (Oral)   Resp 16   Wt 251 lb 14.4 oz (114.3 kg)   BMI 34.16 kg/m  BP Readings from Last 3 Encounters:  08/30/22 (!) 146/78  07/04/22 (!) 162/91  11/15/21 (!) 180/104   Wt Readings from Last 3 Encounters:  08/30/22 251 lb 14.4 oz (114.3 kg)  07/04/22 250 lb (113.4 kg)  11/15/21 256 lb 12.8 oz (116.5 kg)      Physical Exam Vitals reviewed.  Constitutional:      General: He is not in acute distress.    Appearance: Normal appearance. He is not diaphoretic.  HENT:     Head: Normocephalic and atraumatic.  Eyes:     General: No scleral icterus.    Conjunctiva/sclera: Conjunctivae normal.  Cardiovascular:     Rate and Rhythm: Normal rate and regular rhythm.     Pulses: Normal pulses.     Heart sounds: Normal heart sounds. No murmur heard. Pulmonary:     Effort: Pulmonary effort is normal. No respiratory distress.     Breath sounds: Normal breath sounds. No wheezing or rhonchi.  Musculoskeletal:     Cervical back: Neck supple.     Right lower leg: No edema.     Left lower leg: No edema.  Lymphadenopathy:     Cervical: No cervical adenopathy.  Skin:    General: Skin is warm and dry.     Findings: No rash.  Neurological:     Mental Status: He is  alert and oriented to person, place, and time. Mental status is at baseline.  Psychiatric:        Mood and Affect: Mood normal.        Behavior: Behavior normal.       No results found for any visits on 08/30/22.  Assessment & Plan     Problem List Items Addressed This Visit       Cardiovascular and Mediastinum   Essential hypertension - Primary    Chronic and uncontrolled Making big lifestyle changes On max doses of losartan, hctz, and amlodipine No changes today but consider adding BB in the future Recheck metabolic panel at next visit        Other   Hyperlipidemia    Planning lifestyle changes Continue Crestor  Plan to  recheck FLP at next visit      Obesity    Planning on major lifestyle changes after the Christmas holiday Discussed importance of healthy weight management Discussed diet and exercise         Return in about 2 months (around 10/31/2022) for chronic disease f/u.      I, Lavon Paganini, MD, have reviewed all documentation for this visit. The documentation on 08/30/22 for the exam, diagnosis, procedures, and orders are all accurate and complete.   Bacigalupo, Dionne Bucy, MD, MPH [facility]

## 2022-08-30 ENCOUNTER — Ambulatory Visit (INDEPENDENT_AMBULATORY_CARE_PROVIDER_SITE_OTHER): Payer: Medicare HMO | Admitting: Family Medicine

## 2022-08-30 ENCOUNTER — Encounter: Payer: Self-pay | Admitting: Family Medicine

## 2022-08-30 VITALS — BP 146/78 | HR 66 | Temp 98.0°F | Resp 16 | Wt 251.9 lb

## 2022-08-30 DIAGNOSIS — E669 Obesity, unspecified: Secondary | ICD-10-CM

## 2022-08-30 DIAGNOSIS — Z6834 Body mass index (BMI) 34.0-34.9, adult: Secondary | ICD-10-CM | POA: Diagnosis not present

## 2022-08-30 DIAGNOSIS — I10 Essential (primary) hypertension: Secondary | ICD-10-CM | POA: Diagnosis not present

## 2022-08-30 DIAGNOSIS — E782 Mixed hyperlipidemia: Secondary | ICD-10-CM

## 2022-08-30 NOTE — Assessment & Plan Note (Signed)
Planning lifestyle changes Continue Crestor  Plan to recheck FLP at next visit

## 2022-08-30 NOTE — Assessment & Plan Note (Signed)
Planning on major lifestyle changes after the Christmas holiday Discussed importance of healthy weight management Discussed diet and exercise

## 2022-08-30 NOTE — Assessment & Plan Note (Signed)
Chronic and uncontrolled Making big lifestyle changes On max doses of losartan, hctz, and amlodipine No changes today but consider adding BB in the future Recheck metabolic panel at next visit

## 2022-09-15 DIAGNOSIS — Z0100 Encounter for examination of eyes and vision without abnormal findings: Secondary | ICD-10-CM | POA: Diagnosis not present

## 2022-09-29 ENCOUNTER — Ambulatory Visit (INDEPENDENT_AMBULATORY_CARE_PROVIDER_SITE_OTHER): Payer: Medicare HMO

## 2022-09-29 VITALS — Ht 72.0 in | Wt 251.0 lb

## 2022-09-29 DIAGNOSIS — Z0000 Encounter for general adult medical examination without abnormal findings: Secondary | ICD-10-CM

## 2022-09-29 NOTE — Patient Instructions (Signed)
Mr. Tyler [name] , Thank you for taking time to come for your Medicare Wellness Visit. I appreciate your ongoing commitment to your health goals. Please review the following plan we discussed and let me know if I can assist you in the future.   Screening recommendations/referrals: Colonoscopy: 10/09/18 Recommended yearly ophthalmology/optometry visit for glaucoma screening and checkup Recommended yearly dental visit for hygiene and checkup  Vaccinations: Influenza vaccine: 06/30/21 Pneumococcal vaccine: 10/17/19 Tdap vaccine: 05/11/15 Shingles vaccine: n/d   Covid-19: n/d  Advanced directives: no  Conditions/risks identified: none  Next appointment: Follow up in one year for your annual wellness visit. 10/03/23 @ 1:30 pm by phone  Preventive Care 70 Years and Older, Male Preventive care refers to lifestyle choices and visits with your health care provider that can promote health and wellness. What does preventive care include? A yearly physical exam. This is also called an annual well check. Dental exams once or twice a year. Routine eye exams. Ask your health care provider how often you should have your eyes checked. Personal lifestyle choices, including: Daily care of your teeth and gums. Regular physical activity. Eating a healthy diet. Avoiding tobacco and drug use. Limiting alcohol use. Practicing safe sex. Taking low doses of aspirin every day. Taking vitamin and mineral supplements as recommended by your health care provider. What happens during an annual well check? The services and screenings done by your health care provider during your annual well check will depend on your age, overall health, lifestyle risk factors, and family history of disease. Counseling  Your health care provider may ask you questions about your: Alcohol use. Tobacco use. Drug use. Emotional well-being. Home and relationship well-being. Sexual activity. Eating habits. History of falls. Memory  and ability to understand (cognition). Work and work Statistician. Screening  You may have the following tests or measurements: Height, weight, and BMI. Blood pressure. Lipid and cholesterol levels. These may be checked every 5 years, or more frequently if you are over 70 years old. Skin check. Lung cancer screening. You may have this screening every year starting at age 70 if you have a 30-pack-year history of smoking and currently smoke or have quit within the past 15 years. Fecal occult blood test (FOBT) of the stool. You may have this test every year starting at age 70. Flexible sigmoidoscopy or colonoscopy. You may have a sigmoidoscopy every 5 years or a colonoscopy every 10 years starting at age 18. Prostate cancer screening. Recommendations will vary depending on your family history and other risks. Hepatitis C blood test. Hepatitis B blood test. Sexually transmitted disease (STD) testing. Diabetes screening. This is done by checking your blood sugar (glucose) after you have not eaten for a while (fasting). You may have this done every 1-3 years. Abdominal aortic aneurysm (AAA) screening. You may need this if you are a current or former smoker. Osteoporosis. You may be screened starting at age 95 if you are at high risk. Talk with your health care provider about your test results, treatment options, and if necessary, the need for more tests. Vaccines  Your health care provider may recommend certain vaccines, such as: Influenza vaccine. This is recommended every year. Tetanus, diphtheria, and acellular pertussis (Tdap, Td) vaccine. You may need a Td booster every 10 years. Zoster vaccine. You may need this after age 70. Pneumococcal 13-valent conjugate (PCV13) vaccine. One dose is recommended after age 63. Pneumococcal polysaccharide (PPSV23) vaccine. One dose is recommended after age 70. Talk to your health care provider about  which screenings and vaccines you need and how often you  need them. This information is not intended to replace advice given to you by your health care provider. Make sure you discuss any questions you have with your health care provider. Document Released: 10/02/2015 Document Revised: 05/25/2016 Document Reviewed: 07/07/2015 Elsevier Interactive Patient Education  2017 [employer] Prevention in the Home Falls can cause injuries. They can happen to people of all ages. There are many things you can do to make your home safe and to help prevent falls. What can I do on the outside of my home? Regularly fix the edges of walkways and driveways and fix any cracks. Remove anything that might make you trip as you walk through a door, such as a raised step or threshold. Trim any bushes or trees on the path to your home. Use bright outdoor lighting. Clear any walking paths of anything that might make someone trip, such as rocks or tools. Regularly check to see if handrails are loose or broken. Make sure that both sides of any steps have handrails. Any raised decks and porches should have guardrails on the edges. Have any leaves, snow, or ice cleared regularly. Use sand or salt on walking paths during winter. Clean up any spills in your garage right away. This includes oil or grease spills. What can I do in the bathroom? Use night lights. Install grab bars by the toilet and in the tub and shower. Do not use towel bars as grab bars. Use non-skid mats or decals in the tub or shower. If you need to sit down in the shower, use a plastic, non-slip stool. Keep the floor dry. Clean up any water that spills on the floor as soon as it happens. Remove soap buildup in the tub or shower regularly. Attach bath mats securely with double-sided non-slip rug tape. Do not have throw rugs and other things on the floor that can make you trip. What can I do in the bedroom? Use night lights. Make sure that you have a light by your bed that is easy to reach. Do not  use any sheets or blankets that are too big for your bed. They should not hang down onto the floor. Have a firm chair that has side arms. You can use this for support while you get dressed. Do not have throw rugs and other things on the floor that can make you trip. What can I do in the kitchen? Clean up any spills right away. Avoid walking on wet floors. Keep items that you use a lot in easy-to-reach places. If you need to reach something above you, use a strong step stool that has a grab bar. Keep electrical cords out of the way. Do not use floor polish or wax that makes floors slippery. If you must use wax, use non-skid floor wax. Do not have throw rugs and other things on the floor that can make you trip. What can I do with my stairs? Do not leave any items on the stairs. Make sure that there are handrails on both sides of the stairs and use them. Fix handrails that are broken or loose. Make sure that handrails are as long as the stairways. Check any carpeting to make sure that it is firmly attached to the stairs. Fix any carpet that is loose or worn. Avoid having throw rugs at the top or bottom of the stairs. If you do have throw rugs, attach them to the floor with  carpet tape. Make sure that you have a light switch at the top of the stairs and the bottom of the stairs. If you do not have them, ask someone to add them for you. What else can I do to help prevent falls? Wear shoes that: Do not have high heels. Have rubber bottoms. Are comfortable and fit you well. Are closed at the toe. Do not wear sandals. If you use a stepladder: Make sure that it is fully opened. Do not climb a closed stepladder. Make sure that both sides of the stepladder are locked into place. Ask someone to hold it for you, if possible. Clearly [place] and make sure that you can see: Any grab bars or handrails. First and last steps. Where the edge of each step is. Use tools that help you move around (mobility  aids) if they are needed. These include: Canes. Walkers. Scooters. Crutches. Turn on the lights when you go into a dark area. Replace any light bulbs as soon as they burn out. Set up your furniture so you have a clear path. Avoid moving your furniture around. If any of your floors are uneven, fix them. If there are any pets around you, be aware of where they are. Review your medicines with your doctor. Some medicines can make you feel dizzy. This can increase your chance of falling. Ask your doctor what other things that you can do to help prevent falls. This information is not intended to replace advice given to you by your health care provider. Make sure you discuss any questions you have with your health care provider. Document Released: 07/02/2009 Document Revised: 02/11/2016 Document Reviewed: 10/10/2014 Elsevier Interactive Patient Education  2017 [employer].

## 2022-09-29 NOTE — Progress Notes (Signed)
Virtual Visit via Telephone Note  I connected with  Tyler [name] on 09/29/22 at  8:15 AM EST by telephone and verified that I am speaking with the correct person using two identifiers.  Location: Patient: home Provider: [facility] Persons participating in the virtual visit: Tyler [name]   I discussed the limitations, risks, security and privacy concerns of performing an evaluation and management service by telephone and the availability of in person appointments. The patient expressed understanding and agreed to proceed.  Interactive audio and video telecommunications were attempted between this nurse and patient, however failed, due to patient having technical difficulties OR patient did not have access to video capability.  We continued and completed visit with audio only.  Some vital signs may be absent or patient reported.   Tyler David, LPN  Subjective:   Tyler [name] is a 72 y.o. male who presents for Medicare Annual/Subsequent preventive examination.  Review of Systems     Cardiac Risk Factors include: advanced age (>65mn, >>46women);male gender;hypertension;dyslipidemia     Objective:    There were no vitals filed for this visit. There is no height or weight on file to calculate BMI.     09/29/2022    8:16 AM 09/28/2021    8:35 AM 09/23/2020    8:31 AM 09/28/2019    9:25 PM 09/10/2019    9:41 AM 10/09/2018    8:05 AM  Advanced Directives  Does Patient Have a Medical Advance Directive? No Yes Yes Yes Yes Yes  Type of AArmed forces technical officerof AOld FortLiving will Healthcare Power of ASwayzeeLiving will HPayetteLiving will  Does patient want to make changes to medical advance directive?  Yes (Inpatient - patient defers changing a medical advance directive and declines information at this time)      Copy of HAnnexin Chart?  No - copy requested  No - copy requested  No - copy requested No - copy requested  Would patient like information on creating a medical advance directive? No - Patient declined         Current Medications (verified) Outpatient Encounter Medications as of 09/29/2022  Medication Sig   amLODipine (NORVASC) 10 MG tablet Take 1 tablet (10 mg total) by mouth daily.   hydrochlorothiazide (HYDRODIURIL) 25 MG tablet Take 1 tablet (25 mg total) by mouth daily.   losartan (COZAAR) 100 MG tablet Take 1 tablet (100 mg total) by mouth daily.   rosuvastatin (CRESTOR) 20 MG tablet Take 1 tablet (20 mg total) by mouth daily.   No facility-administered encounter medications on file as of 09/29/2022.    Allergies (verified) Patient has no known allergies.   History: Past Medical History:  Diagnosis Date   Dysplastic nevus 02/09/2009   L upper back med to mid scapula - mod   Dysplastic nevus 02/09/2009   R bicep - mod   Dysplastic nevus 04/08/2009   L upper back med to mid scapula 7.0 cm lat to spine - excision   Dysplastic nevus 04/08/2009   Med and inf to L upper back med to med scapula 7.0 cm lat to spine - mod, excsion 06/03/2009   Dysplastic nevus 09/22/2009   R upper back - mild   Dysplastic nevus 09/22/2009   L upper back sup 4.0 cm lat to spine - mild   Dysplastic nevus 09/22/2009   L upper back inf 4.0 cm lat to spine -  mild   Dysplastic nevus 09/22/2009   R side 14.0 cm above waistline - mild   Dysplastic nevus 07/15/2013   Midline chest - mod   Dysplastic nevus 12/18/2017   R ant lat deltoid - moderate   Dysplastic nevus 01/30/2018   L med calf near popliteal - severe   Dysplastic nevus 01/30/2018   L post lat base of neck - severe   Dysplastic nevus 01/30/2018   L 5.0 cm lat to spine upper back med to mid scapula - mod to severe   History of pilonidal cyst    Hyperlipidemia    Hypertension    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   Treated with Chemo and radiation at [facility]   Past Surgical History:   Procedure Laterality Date   COLONOSCOPY     COLONOSCOPY WITH PROPOFOL N/A 10/09/2018   Procedure: COLONOSCOPY WITH PROPOFOL;  Surgeon: Lucilla Lame, MD;  Location: [facility] ENDOSCOPY;  Service: Endoscopy;  Laterality: N/A;   KNEE ARTHROSCOPY Right    NASAL SEPTUM SURGERY  2008   NASAL SINUS SURGERY  2008   PILONIDAL CYST EXCISION     VASECTOMY     Family History  Problem Relation Age of Onset   Heart disease Mother    Hyperlipidemia Mother    Hypertension Mother    CVA Mother 64   Leukemia Father    Diabetes Brother    Colon cancer Brother 74   Healthy Daughter    Healthy Daughter    Colon cancer Paternal Uncle    Social History   Socioeconomic History   Marital status: Married    Spouse name: Not on file   Number of children: 2   Years of education: Not on file   Highest education level: Some college, no degree  Occupational History   Occupation: retired Medical illustrator  Tobacco Use   Smoking status: Never   Smokeless tobacco: Never  Vaping Use   Vaping Use: Never used  Substance and Sexual Activity   Alcohol use: Yes    Alcohol/week: 3.0 standard drinks of alcohol    Types: 3 Cans of beer per week   Drug use: Never   Sexual activity: Yes    Partners: Female    Birth control/protection: Surgical  Other Topics Concern   Not on file  Social History Narrative   Not on file   Social Determinants of Health   Financial Resource Strain: Low Risk  (09/29/2022)   Overall Financial Resource Strain (CARDIA)    Difficulty of Paying Living Expenses: Not hard at all  Food Insecurity: No Food Insecurity (09/29/2022)   Hunger Vital Sign    Worried About Running Out of Food in the Last Year: Never true    Ran Out of Food in the Last Year: Never true  Transportation Needs: No Transportation Needs (09/29/2022)   PRAPARE - Hydrologist (Medical): No    Lack of Transportation (Non-Medical): No  Physical Activity: Insufficiently Active (09/29/2022)    Exercise Vital Sign    Days of Exercise per Week: 2 days    Minutes of Exercise per Session: 30 min  Stress: No Stress Concern Present (09/29/2022)   [place]    Feeling of Stress : Not at all  Social Connections: Moderately Integrated (09/29/2022)   Social Connection and Isolation Panel [NHANES]    Frequency of Communication with Friends and Family: More than three times a week    Frequency  of Social Gatherings with Friends and Family: More than three times a week    Attends Religious Services: More than 4 times per year    Active Member of [employer] or Organizations: No    Attends Music therapist: Never    Marital Status: Married    Tobacco Counseling Counseling given: Not Answered   Clinical Intake:  Pre-visit preparation completed: Yes  Pain : No/denies pain     Nutritional Risks: None Diabetes: No  How often do you need to have someone help you when you read instructions, pamphlets, or other written materials from your doctor or pharmacy?: 1 - Never  Diabetic?no  Interpreter Needed?: No  Information entered by :: Kirke Shaggy, LPN   Activities of Daily Living    09/29/2022    8:17 AM 09/25/2022    5:11 PM  In your present state of health, do you have any difficulty performing the following activities:  Hearing? 0 0  Vision? 0 0  Difficulty concentrating or making decisions? 0 0  Walking or climbing stairs? 0 0  Dressing or bathing? 0 0  Doing errands, shopping? 0 0  Preparing Food and eating ? N N  Using the Toilet? N N  In the past six months, have you accidently leaked urine? N N  Do you have problems with loss of bowel control? N N  Managing your Medications? N N  Managing your Finances? N N  Housekeeping or managing your Housekeeping? N N    Patient Care Team: Virginia Crews, MD as PCP - General (Family Medicine) Ralene Bathe, MD (Dermatology) Odette Fraction [facility])  Indicate any recent Medical Services you may have received from other than [facility] providers in the past year (date may be approximate).     Assessment:   This is a routine wellness examination for [name].  Hearing/Vision screen Hearing Screening - Comments:: No aids Vision Screening - Comments:: Wears glasses- DrThurmond  Dietary issues and exercise activities discussed: Current Exercise Habits: Home exercise routine, Type of exercise: walking, Time (Minutes): 30, Frequency (Times/Week): 2, Weekly Exercise (Minutes/Week): 60, Intensity: Mild   Goals Addressed             This Visit's Progress    DIET - INCREASE WATER INTAKE         Depression Screen    09/29/2022    8:15 AM 07/04/2022    8:31 AM 11/15/2021    8:07 AM 09/28/2021    8:33 AM 09/23/2020    8:29 AM 09/10/2019    9:41 AM 09/10/2019    9:36 AM  PHQ 2/9 Scores  PHQ - 2 Score 0 0 0 0 0 0 0  PHQ- 9 Score 0 0 0        Fall Risk    09/29/2022    8:17 AM 09/25/2022    5:11 PM 07/04/2022    8:31 AM 11/15/2021    8:08 AM 09/28/2021    8:36 AM  Fall Risk   Falls in the past year? 0 0 0 0 0  Number falls in past yr: 0 0 0 0 0  Injury with Fall? 0 0 0 0 0  Risk for fall due to : No Fall Risks  No Fall Risks No Fall Risks No Fall Risks  Follow up Falls prevention discussed;Falls evaluation completed  Falls evaluation completed Falls evaluation completed Falls evaluation completed    FALL RISK PREVENTION PERTAINING TO THE HOME:  Any stairs in or around  the home? Yes  If so, are there any without handrails? No  Home free of loose throw rugs in walkways, pet beds, electrical cords, etc? Yes  Adequate lighting in your home to reduce risk of falls? Yes   ASSISTIVE DEVICES UTILIZED TO PREVENT FALLS:  Life alert? No  Use of a cane, walker or w/c? No  Grab bars in the bathroom? Yes  Shower chair or bench in shower? No  Elevated toilet seat or a handicapped toilet? Yes   Cognitive Function:         09/29/2022    8:18 AM 07/04/2022    8:31 AM 09/03/2018   10:56 AM  6CIT Screen  What Year? 0 points 0 points 0 points  What month? 0 points 0 points 0 points  What time? 0 points 0 points 0 points  Count back from 20 0 points 0 points 0 points  Months in reverse 0 points 0 points 0 points  Repeat phrase 0 points 0 points 0 points  Total Score 0 points 0 points 0 points    Immunizations Immunization History  Administered Date(s) Administered   Influenza, High Dose Seasonal PF 07/19/2018, 05/28/2019   Influenza-Unspecified 06/30/2021   Pneumococcal Conjugate-13 07/19/2018   Pneumococcal Polysaccharide-23 10/17/2019   Td 03/04/2008   Tdap 05/11/2015    TDAP status: Up to date  Flu Vaccine status: Due, Education has been provided regarding the importance of this vaccine. Advised may receive this vaccine at local pharmacy or Health Dept. Aware to provide a copy of the vaccination record if obtained from local pharmacy or Health Dept. Verbalized acceptance and understanding.  Pneumococcal vaccine status: Up to date  Covid-19 vaccine status: Declined, Education has been provided regarding the importance of this vaccine but patient still declined. Advised may receive this vaccine at local pharmacy or Health Dept.or vaccine clinic. Aware to provide a copy of the vaccination record if obtained from local pharmacy or Health Dept. Verbalized acceptance and understanding.  Qualifies for Shingles Vaccine? Yes   Zostavax completed No   Shingrix Completed?: No.    Education has been provided regarding the importance of this vaccine. Patient has been advised to call insurance company to determine out of pocket expense if they have not yet received this vaccine. Advised may also receive vaccine at local pharmacy or Health Dept. Verbalized acceptance and understanding.  Screening Tests Health Maintenance  Topic Date Due   COVID-19 Vaccine (1) Never done   Zoster Vaccines- Shingrix (1 of 2) Never  done   INFLUENZA VACCINE  12/18/2022 (Originally 04/19/2022)   Medicare Annual Wellness (AWV)  09/30/2023   COLONOSCOPY (Pts 45-86yr Insurance coverage will need to be confirmed)  10/10/2023   DTaP/Tdap/Td (3 - Td or Tdap) 05/10/2025   Pneumonia Vaccine 70 Years old  Completed   Hepatitis C Screening  Completed   HPV VACCINES  Aged Out    Health Maintenance  Health Maintenance Due  Topic Date Due   COVID-19 Vaccine (1) Never done   Zoster Vaccines- Shingrix (1 of 2) Never done    Colorectal cancer screening: Type of screening: Colonoscopy. Completed 10/09/18. Repeat every 5 years  Lung Cancer Screening: (Low Dose CT Chest recommended if Age 70-80years, 30 pack-year currently smoking OR have quit w/in 15years.) does not qualify.   Additional Screening:  Hepatitis C Screening: does qualify; Completed 07/23/18  Vision Screening: Recommended annual ophthalmology exams for early detection of glaucoma and other disorders of the eye. Is the patient up to date  with their annual eye exam?  Yes  Who is the provider or what is the name of the office in which the patient attends annual eye exams? Dr.Thurmond  If pt is not established with a provider, would they like to be referred to a provider to establish care? No .   Dental Screening: Recommended annual dental exams for proper oral hygiene  Community Resource Referral / Chronic Care Management: CRR required this visit?  No   CCM required this visit?  No      Plan:     I have personally reviewed and noted the following in the patient's chart:   Medical and social history Use of alcohol, tobacco or illicit drugs  Current medications and supplements including opioid prescriptions. Patient is not currently taking opioid prescriptions. Functional ability and status Nutritional status Physical activity Advanced directives List of other physicians Hospitalizations, surgeries, and ER visits in previous 12 months Vitals Screenings  to include cognitive, depression, and falls Referrals and appointments  In addition, I have reviewed and discussed with patient certain preventive protocols, quality metrics, and best practice recommendations. A written personalized care plan for preventive services as well as general preventive health recommendations were provided to patient.     Tyler David, LPN   [id number]/[id number]   Nurse Notes: none

## 2022-10-31 ENCOUNTER — Ambulatory Visit: Payer: Medicare HMO | Admitting: Family Medicine

## 2022-11-10 ENCOUNTER — Ambulatory Visit: Payer: Medicare HMO | Admitting: Dermatology

## 2022-12-15 ENCOUNTER — Other Ambulatory Visit: Payer: Self-pay | Admitting: Family Medicine

## 2022-12-15 NOTE — Telephone Encounter (Signed)
Requested medication (s) are due for refill today:   Yes  Requested medication (s) are on the active medication list:   Yes  Future visit scheduled:   No   He cancelled his 3 month f/u appt for 10/31/2022   Last ordered: 07/04/2022 #90, 1 refill  Returned because labs are due per protocol.   Requested Prescriptions  Pending Prescriptions Disp Refills   losartan (COZAAR) 100 MG tablet [Pharmacy Med Name: LOSARTAN POTASSIUM 100 MG TAB] 90 tablet 1    Sig: TAKE 1 TABLET BY MOUTH EVERY DAY     Cardiovascular:  Angiotensin Receptor Blockers Failed - 12/15/2022  2:08 AM      Failed - Cr in normal range and within 180 days    Creatinine, Ser  Date Value Ref Range Status  07/04/2022 1.50 (H) 0.76 - 1.27 mg/dL Final         Failed - K in normal range and within 180 days    Potassium  Date Value Ref Range Status  07/04/2022 5.6 (H) 3.5 - 5.2 mmol/L Final         Failed - Last BP in normal range    BP Readings from Last 1 Encounters:  08/30/22 (!) 146/78         Passed - Patient is not pregnant      Passed - Valid encounter within last 6 months    Recent Outpatient Visits           3 months ago Essential hypertension   [place], Dionne Bucy, MD   5 months ago Encounter for annual physical exam   [place], Dionne Bucy, MD   1 year ago Essential hypertension   [place], Dionne Bucy, MD   3 years ago Encounter for annual physical exam   [place], Dionne Bucy, MD   3 years ago Essential hypertension   [place], Dionne Bucy, MD       Future Appointments             In 2 months Ralene Bathe, MD [place]

## 2023-02-14 ENCOUNTER — Ambulatory Visit: Payer: Medicare HMO | Admitting: Dermatology

## 2023-03-08 ENCOUNTER — Ambulatory Visit: Payer: Medicare HMO | Admitting: Dermatology

## 2023-03-08 VITALS — BP 165/84

## 2023-03-08 DIAGNOSIS — L578 Other skin changes due to chronic exposure to nonionizing radiation: Secondary | ICD-10-CM | POA: Diagnosis not present

## 2023-03-08 DIAGNOSIS — L720 Epidermal cyst: Secondary | ICD-10-CM

## 2023-03-08 DIAGNOSIS — Z1283 Encounter for screening for malignant neoplasm of skin: Secondary | ICD-10-CM | POA: Diagnosis not present

## 2023-03-08 DIAGNOSIS — L738 Other specified follicular disorders: Secondary | ICD-10-CM | POA: Diagnosis not present

## 2023-03-08 DIAGNOSIS — L918 Other hypertrophic disorders of the skin: Secondary | ICD-10-CM | POA: Diagnosis not present

## 2023-03-08 DIAGNOSIS — L820 Inflamed seborrheic keratosis: Secondary | ICD-10-CM

## 2023-03-08 DIAGNOSIS — L821 Other seborrheic keratosis: Secondary | ICD-10-CM | POA: Diagnosis not present

## 2023-03-08 DIAGNOSIS — L814 Other melanin hyperpigmentation: Secondary | ICD-10-CM | POA: Diagnosis not present

## 2023-03-08 DIAGNOSIS — Z86018 Personal history of other benign neoplasm: Secondary | ICD-10-CM | POA: Diagnosis not present

## 2023-03-08 DIAGNOSIS — W908XXA Exposure to other nonionizing radiation, initial encounter: Secondary | ICD-10-CM

## 2023-03-08 DIAGNOSIS — D1801 Hemangioma of skin and subcutaneous tissue: Secondary | ICD-10-CM | POA: Diagnosis not present

## 2023-03-08 DIAGNOSIS — L729 Follicular cyst of the skin and subcutaneous tissue, unspecified: Secondary | ICD-10-CM

## 2023-03-08 DIAGNOSIS — D229 Melanocytic nevi, unspecified: Secondary | ICD-10-CM

## 2023-03-08 NOTE — Progress Notes (Signed)
Follow-Up Visit   Subjective  Tyler [name] is a 72 y.o. male who presents for the following: Skin Cancer Screening and Full Body Skin Exam, check spots, R flank hx of swelling, draining ~37m ago, spot L temple scaly, hx of Dysplastic nevi The patient presents for Total-Body Skin Exam (TBSE) for skin cancer screening and mole check. The patient has spots, moles and lesions to be evaluated, some may be new or changing and the patient has concerns that these could be cancer.  The following portions of the chart were reviewed this encounter and updated as appropriate: medications, allergies, medical history  Review of Systems:  No other skin or systemic complaints except as noted in HPI or Assessment and Plan.  Objective  Well appearing patient in no apparent distress; mood and affect are within normal limits.  A full examination was performed including scalp, head, eyes, ears, nose, lips, neck, chest, axillae, abdomen, back, buttocks, bilateral upper extremities, bilateral lower extremities, hands, feet, fingers, toes, fingernails, and toenails. All findings within normal limits unless otherwise noted below.   Relevant physical exam findings are noted in the Assessment and Plan.  L temple x 1, R sideburn x 2 (3) Stuck on waxy paps with erythema   Assessment & Plan   LENTIGINES, SEBORRHEIC KERATOSES, HEMANGIOMAS - Benign normal skin lesions - Benign-appearing - Call for any changes  MELANOCYTIC NEVI - Tan-brown and/or pink-flesh-colored symmetric macules and papules - Benign appearing on exam today - Observation - Call clinic for new or changing moles - Recommend daily use of broad spectrum spf 30+ sunscreen to sun-exposed areas.   ACTINIC DAMAGE - Chronic condition, secondary to cumulative UV/sun exposure - diffuse scaly erythematous macules with underlying dyspigmentation - Recommend daily broad spectrum sunscreen SPF 30+ to sun-exposed areas, reapply every 2 hours as needed.   - Staying in the shade or wearing long sleeves, sun glasses (UVA+UVB protection) and wide brim hats (4-inch brim around the entire circumference of the hat) are also recommended for sun protection.  - Call for new or changing lesions.  SKIN CANCER SCREENING PERFORMED TODAY.  HISTORY OF DYSPLASTIC NEVUS No evidence of recurrence today Recommend regular full body skin exams Recommend daily broad spectrum sunscreen SPF 30+ to sun-exposed areas, reapply every 2 hours as needed.  Call if any new or changing lesions are noted between office visits  - Multiple  Inflamed seborrheic keratosis (3) L temple x 1, R sideburn x 2  Symptomatic, irritating, patient would like treated.   Destruction of lesion - L temple x 1, R sideburn x 2 Complexity: simple   Destruction method: cryotherapy   Informed consent: discussed and consent obtained   Timeout:  patient name, date of birth, surgical site, and procedure verified Lesion destroyed using liquid nitrogen: Yes   Region frozen until ice ball extended beyond lesion: Yes   Outcome: patient tolerated procedure well with no complications   Post-procedure details: wound care instructions given    EPIDERMAL INCLUSION CYST  With hx of Rupture ~89m ago Exam: Subcutaneous nodule at R flank  Benign-appearing. Exam most consistent with an epidermal inclusion cyst. Discussed that a cyst is a benign growth that can grow over time and sometimes get irritated or inflamed. Recommend observation if it is not bothersome. Discussed option of surgical excision to remove it if it is growing, symptomatic, or other changes noted. Please call for new or changing lesions so they can be evaluated.  Sebaceous Hyperplasia - Small yellow papules with a  central dell - Benign-appearing - Observe. Call for changes.  - Discussed treatment   Acrochordons (Skin Tags) - Fleshy, skin-colored pedunculated papules - Benign appearing.  - Observe. - If desired, they can be  removed with an in office procedure that is not covered by insurance. - Please call the clinic if you notice any new or changing lesions.   Return in about 10 months (around 01/06/2024) for TBSE, Hx of Dysplastic nevi, txt Sebaeous hyperplasia.  I, Ardis Rowan, RMA, am acting as scribe for Armida Sans, MD .  Documentation: I have reviewed the above documentation for accuracy and completeness, and I agree with the above.  Armida Sans, MD

## 2023-03-08 NOTE — Patient Instructions (Signed)
Cryotherapy Aftercare  Wash gently with soap and water everyday.   Apply Vaseline and Band-Aid daily until healed.     Due to recent changes in healthcare laws, you may see results of your pathology and/or laboratory studies on MyChart before the doctors have had a chance to review them. We understand that in some cases there may be results that are confusing or concerning to you. Please understand that not all results are received at the same time and often the doctors may need to interpret multiple results in order to provide you with the best plan of care or course of treatment. Therefore, we ask that you please give us 2 business days to thoroughly review all your results before contacting the office for clarification. Should we see a critical lab result, you will be contacted sooner.   If You Need Anything After Your Visit  If you have any questions or concerns for your doctor, please call our main line at [phone] and press option 4 to reach your doctor's medical assistant. If no one answers, please leave a voicemail as directed and we will return your call as soon as possible. Messages left after 4 pm will be answered the following business day.   You may also send us a message via MyChart. We typically respond to MyChart messages within 1-2 business days.  For prescription refills, please ask your pharmacy to contact our office. Our fax number is [phone].  If you have an urgent issue when the clinic is closed that cannot wait until the next business day, you can page your doctor at the number below.    Please note that while we do our best to be available for urgent issues outside of office hours, we are not available 24/7.   If you have an urgent issue and are unable to reach us, you may choose to seek medical care at your doctor's office, retail clinic, urgent care center, or emergency room.  If you have a medical emergency, please immediately call 911 or go to the  emergency department.  Pager Numbers  - Dr. Kowalski: [phone]  - Dr. Moye: [phone]  - Dr. Stewart: [phone]  In the event of inclement weather, please call our main line at [phone] for an update on the status of any delays or closures.  Dermatology Medication Tips: Please keep the boxes that topical medications come in in order to help keep track of the instructions about where and how to use these. Pharmacies typically print the medication instructions only on the boxes and not directly on the medication tubes.   If your medication is too expensive, please contact our office at [phone] option 4 or send us a message through MyChart.   We are unable to tell what your co-pay for medications will be in advance as this is different depending on your insurance coverage. However, we may be able to find a substitute medication at lower cost or fill out paperwork to get insurance to cover a needed medication.   If a prior authorization is required to get your medication covered by your insurance company, please allow us 1-2 business days to complete this process.  Drug prices often vary depending on where the prescription is filled and some pharmacies may offer cheaper prices.  The website www.goodrx.com contains coupons for medications through different pharmacies. The prices here do not account for what the cost may be with help from insurance (it may be cheaper with your insurance), but the website can   give you the price if you did not use any insurance.  - You can print the associated coupon and take it with your prescription to the pharmacy.  - You may also stop by our office during regular business hours and pick up a GoodRx coupon card.  - If you need your prescription sent electronically to a different pharmacy, notify our office through [place] MyChart or by phone at [phone] option 4.     Si Usted Necesita Algo Despus de Su Visita  Tambin puede  enviarnos un mensaje a travs de MyChart. Por lo general respondemos a los mensajes de MyChart en el transcurso de 1 a 2 das hbiles.  Para renovar recetas, por favor pida a su farmacia que se ponga en contacto con nuestra oficina. Nuestro nmero de fax es el [phone].  Si tiene un asunto urgente cuando la clnica est cerrada y que no puede esperar hasta el siguiente da hbil, puede llamar/localizar a su doctor(a) al nmero que aparece a continuacin.   Por favor, tenga en cuenta que aunque hacemos todo lo posible para estar disponibles para asuntos urgentes fuera del horario de oficina, no estamos disponibles las 24 horas del da, los 7 das de la semana.   Si tiene un problema urgente y no puede comunicarse con nosotros, puede optar por buscar atencin mdica  en el consultorio de su doctor(a), en una clnica privada, en un centro de atencin urgente o en una sala de emergencias.  Si tiene una emergencia mdica, por favor llame inmediatamente al 911 o vaya a la sala de emergencias.  Nmeros de bper  - Dr. Kowalski: [phone]  - Dra. Moye: [phone]  - Dra. Stewart: [phone]  En caso de inclemencias del tiempo, por favor llame a nuestra lnea principal al [phone] para una actualizacin sobre el estado de cualquier retraso o cierre.  Consejos para la medicacin en dermatologa: Por favor, guarde las cajas en las que vienen los medicamentos de uso tpico para ayudarle a seguir las instrucciones sobre dnde y cmo usarlos. Las farmacias generalmente imprimen las instrucciones del medicamento slo en las cajas y no directamente en los tubos del medicamento.   Si su medicamento es muy caro, por favor, pngase en contacto con nuestra oficina llamando al [phone] y presione la opcin 4 o envenos un mensaje a travs de MyChart.   No podemos decirle cul ser su copago por los medicamentos por adelantado ya que esto es diferente dependiendo de la cobertura de su seguro.  Sin embargo, es posible que podamos encontrar un medicamento sustituto a menor costo o llenar un formulario para que el seguro cubra el medicamento que se considera necesario.   Si se requiere una autorizacin previa para que su compaa de seguros cubra su medicamento, por favor permtanos de 1 a 2 das hbiles para completar este proceso.  Los precios de los medicamentos varan con frecuencia dependiendo del lugar de dnde se surte la receta y alguna farmacias pueden ofrecer precios ms baratos.  El sitio web www.goodrx.com tiene cupones para medicamentos de diferentes farmacias. Los precios aqu no tienen en cuenta lo que podra costar con la ayuda del seguro (puede ser ms barato con su seguro), pero el sitio web puede darle el precio si no utiliz ningn seguro.  - Puede imprimir el cupn correspondiente y llevarlo con su receta a la farmacia.  - Tambin puede pasar por nuestra oficina durante el horario de atencin regular y recoger una tarjeta de cupones de GoodRx.  -   Si necesita que su receta se enve electrnicamente a una farmacia diferente, informe a nuestra oficina a travs de MyChart de [place] o por telfono llamando al [phone] y presione la opcin 4.  

## 2023-03-10 ENCOUNTER — Encounter: Payer: Self-pay | Admitting: Dermatology

## 2023-04-03 ENCOUNTER — Ambulatory Visit: Payer: Medicare HMO | Admitting: Family Medicine

## 2023-10-03 ENCOUNTER — Ambulatory Visit: Payer: Medicare HMO

## 2023-10-03 DIAGNOSIS — Z1211 Encounter for screening for malignant neoplasm of colon: Secondary | ICD-10-CM

## 2023-10-03 DIAGNOSIS — Z0000 Encounter for general adult medical examination without abnormal findings: Secondary | ICD-10-CM | POA: Diagnosis not present

## 2023-10-03 DIAGNOSIS — H35033 Hypertensive retinopathy, bilateral: Secondary | ICD-10-CM | POA: Diagnosis not present

## 2023-10-03 NOTE — Progress Notes (Signed)
 Subjective:   Tyler [name] is a 73 y.o. male who presents for Medicare Annual/Subsequent preventive examination.  Visit Complete: Virtual I connected with  Oneil DELENA Lewis on 10/03/23 by a video and audio enabled telemedicine application and verified that I am speaking with the correct person using two identifiers.  Patient Location: Home  Provider Location: Office/Clinic  I discussed the limitations of evaluation and management by telemedicine. The patient expressed understanding and agreed to proceed.  Vital Signs: Because this visit was a virtual/telehealth visit, some criteria may be missing or patient reported. Any vitals not documented were not able to be obtained and vitals that have been documented are patient reported.    Cardiac Risk Factors include: advanced age (>34men, >28 women);dyslipidemia;male gender;hypertension;obesity (BMI >30kg/m2)     Objective:    There were no vitals filed for this visit. There is no height or weight on file to calculate BMI.     10/03/2023    1:57 PM 09/29/2022    8:16 AM 09/28/2021    8:35 AM 09/23/2020    8:31 AM 09/28/2019    9:25 PM 09/10/2019    9:41 AM 10/09/2018    8:05 AM  Advanced Directives  Does Patient Have a Medical Advance Directive? No No Yes Yes Yes Yes Yes  Type of Chief Of Staff of [place];Living will Healthcare Power of [place] of [place];Living will Healthcare Power of [place];Living will  Does patient want to make changes to medical advance directive?   Yes (Inpatient - patient defers changing a medical advance directive and declines information at this time)      Copy of Healthcare Power of Attorney in Chart?   No - copy requested No - copy requested  No - copy requested No - copy requested  Would patient like information on creating a medical advance directive? No - Patient declined No - Patient declined         Current Medications  (verified) Outpatient Encounter Medications as of 10/03/2023  Medication Sig   amLODipine  (NORVASC ) 10 MG tablet Take 1 tablet (10 mg total) by mouth daily.   hydrochlorothiazide  (HYDRODIURIL ) 25 MG tablet Take 1 tablet (25 mg total) by mouth daily.   losartan  (COZAAR ) 100 MG tablet TAKE 1 TABLET BY MOUTH EVERY DAY   rosuvastatin  (CRESTOR ) 20 MG tablet Take 1 tablet (20 mg total) by mouth daily.   No facility-administered encounter medications on file as of 10/03/2023.    Allergies (verified) Patient has no known allergies.   History: Past Medical History:  Diagnosis Date   Dysplastic nevus 02/09/2009   L upper back med to mid scapula - mod   Dysplastic nevus 02/09/2009   R bicep - mod   Dysplastic nevus 04/08/2009   L upper back med to mid scapula 7.0 cm lat to spine - excision   Dysplastic nevus 04/08/2009   Med and inf to L upper back med to med scapula 7.0 cm lat to spine - mod, excsion 06/03/2009   Dysplastic nevus 09/22/2009   R upper back - mild   Dysplastic nevus 09/22/2009   L upper back sup 4.0 cm lat to spine - mild   Dysplastic nevus 09/22/2009   L upper back inf 4.0 cm lat to spine - mild   Dysplastic nevus 09/22/2009   R side 14.0 cm above waistline - mild   Dysplastic nevus 07/15/2013   Midline chest - mod   Dysplastic nevus 12/18/2017  R ant lat deltoid - moderate   Dysplastic nevus 01/30/2018   L med calf near popliteal - severe   Dysplastic nevus 01/30/2018   L post lat base of neck - severe   Dysplastic nevus 01/30/2018   L 5.0 cm lat to spine upper back med to mid scapula - mod to severe   History of pilonidal cyst    Hyperlipidemia    Hypertension    Laryngeal cancer (HCC) 2008   Treated with Chemo and [facility] at [facility]   Past Surgical History:  Procedure Laterality Date   COLONOSCOPY     COLONOSCOPY WITH PROPOFOL  N/A 10/09/2018   Procedure: COLONOSCOPY WITH PROPOFOL ;  Surgeon: Jinny Carmine, MD;  Location: [facility] ENDOSCOPY;  Service: Endoscopy;   Laterality: N/A;   KNEE ARTHROSCOPY Right    NASAL SEPTUM SURGERY  2008   NASAL SINUS SURGERY  2008   PILONIDAL CYST EXCISION     VASECTOMY     Family History  Problem Relation Age of Onset   Heart disease Mother    Hyperlipidemia Mother    Hypertension Mother    CVA Mother 69   Leukemia Father    Diabetes Brother    Colon cancer Brother 67   Healthy Daughter    Healthy Daughter    Colon cancer Paternal Uncle    Social History   Socioeconomic History   Marital status: Married    Spouse name: Not on file   Number of children: 2   Years of education: Not on file   Highest education level: Some college, no degree  Occupational History   Occupation: retired Advertising account planner  Tobacco Use   Smoking status: Never   Smokeless tobacco: Never  Vaping Use   Vaping status: Never Used  Substance and Sexual Activity   Alcohol use: Yes    Alcohol/week: 3.0 standard drinks of alcohol    Types: 3 Cans of beer per week   Drug use: Never   Sexual activity: Yes    Partners: Female    Birth control/protection: Surgical  Other Topics Concern   Not on file  Social History Narrative   Not on file   Social Drivers of Health   Financial Resource Strain: Low Risk  (10/03/2023)   Overall Financial Resource Strain (CARDIA)    Difficulty of Paying Living Expenses: Not hard at all  Food Insecurity: No Food Insecurity (10/03/2023)   Hunger Vital Sign    Worried About Running Out of Food in the Last Year: Never true    Ran Out of Food in the Last Year: Never true  Transportation Needs: No Transportation Needs (10/02/2023)   PRAPARE - Administrator, Civil Service (Medical): No    Lack of Transportation (Non-Medical): No  Physical Activity: Sufficiently Active (10/03/2023)   Exercise Vital Sign    Days of Exercise per Week: 3 days    Minutes of Exercise per Session: 90 min  Stress: No Stress Concern Present (10/03/2023)   [employer] of Occupational Health - Occupational  Stress Questionnaire    Feeling of Stress : Not at all  Social Connections: Socially Integrated (10/03/2023)   Social Connection and Isolation Panel [NHANES]    Frequency of Communication with Friends and Family: More than three times a week    Frequency of Social Gatherings with Friends and Family: Once a week    Attends Religious Services: More than 4 times per year    Active Member of [employer] or Organizations: Yes  Attends Engineer, Structural: More than 4 times per year    Marital Status: Married    Tobacco Counseling Counseling given: Not Answered   Clinical Intake:  Pre-visit preparation completed: Yes  Pain : No/denies pain     Nutritional Risks: None Diabetes: No  How often do you need to have someone help you when you read instructions, pamphlets, or other written materials from your doctor or pharmacy?: 1 - Never  Interpreter Needed?: No  Information entered by :: JHONNIE DAS, LPN   Activities of Daily Living    10/03/2023    1:59 PM 09/29/2023   10:22 AM  In your present state of health, do you have any difficulty performing the following activities:  Hearing? 0 0  Vision? 0 0  Difficulty concentrating or making decisions? 0 0  Walking or climbing stairs? 0 0  Dressing or bathing? 0 0  Doing errands, shopping? 0 0  Preparing Food and eating ? N N  Using the Toilet? N N  In the past six months, have you accidently leaked urine? N N  Do you have problems with loss of bowel control? N N  Managing your Medications? N N  Managing your Finances? N N  Housekeeping or managing your Housekeeping? N N    Patient Care Team: Myrla Jon HERO, MD as PCP - General (Family Medicine) Hester Alm BROCKS, MD (Dermatology) Elma Zachary RAMAN [facility])  Indicate any recent Medical Services you may have received from other than [facility] providers in the past year (date may be approximate).     Assessment:   This is a routine wellness examination for  [facility].  Hearing/Vision screen Hearing Screening - Comments:: NO AIDS Vision Screening - Comments:: WEARS GLASSES ALL THE TIME- THURMOND   Goals Addressed             This Visit's Progress    Cut out extra servings         Depression Screen    10/03/2023    1:56 PM 09/29/2022    8:15 AM 07/04/2022    8:31 AM 11/15/2021    8:07 AM 09/28/2021    8:33 AM 09/23/2020    8:29 AM 09/10/2019    9:41 AM  PHQ 2/9 Scores  PHQ - 2 Score 0 0 0 0 0 0 0  PHQ- 9 Score 0 0 0 0       Fall Risk    10/03/2023    1:59 PM 09/29/2023   10:22 AM 09/29/2022    8:17 AM 09/25/2022    5:11 PM 07/04/2022    8:31 AM  Fall Risk   Falls in the past year? 0 0 0 0 0  Number falls in past yr: 0 0 0 0 0  Injury with Fall? 0 0 0 0 0  Risk for fall due to : No Fall Risks  No Fall Risks  No Fall Risks  Follow up Falls prevention discussed;Falls evaluation completed  Falls prevention discussed;Falls evaluation completed  Falls evaluation completed    MEDICARE RISK AT HOME: Medicare Risk at Home Any stairs in or around the home?: Yes If so, are there any without handrails?: No Home free of loose throw rugs in walkways, pet beds, electrical cords, etc?: Yes Adequate lighting in your home to reduce risk of falls?: Yes Life alert?: No Use of a cane, walker or w/c?: No Grab bars in the bathroom?: No Shower chair or bench in shower?: No Elevated toilet seat or a handicapped  toilet?: Yes  TIMED UP AND GO:  Was the test performed?  No    Cognitive Function:        10/03/2023    2:01 PM 09/29/2022    8:18 AM 07/04/2022    8:31 AM 09/03/2018   10:56 AM  6CIT Screen  What Year? 0 points 0 points 0 points 0 points  What month? 0 points 0 points 0 points 0 points  What time? 0 points 0 points 0 points 0 points  Count back from 20 0 points 0 points 0 points 0 points  Months in reverse 0 points 0 points 0 points 0 points  Repeat phrase 0 points 0 points 0 points 0 points  Total Score 0 points 0 points 0  points 0 points    Immunizations Immunization History  Administered Date(s) Administered   Influenza, High Dose Seasonal PF 07/19/2018, 05/28/2019   Influenza-Unspecified 06/30/2021   Pneumococcal Conjugate-13 07/19/2018   Pneumococcal Polysaccharide-23 10/17/2019   Td 03/04/2008   Tdap 05/11/2015    TDAP status: Up to date  Flu Vaccine status: Declined, Education has been provided regarding the importance of this vaccine but patient still declined. Advised may receive this vaccine at local pharmacy or Health Dept. Aware to provide a copy of the vaccination record if obtained from local pharmacy or Health Dept. Verbalized acceptance and understanding.  Pneumococcal vaccine status: Up to date  Covid-19 vaccine status: Declined, Education has been provided regarding the importance of this vaccine but patient still declined. Advised may receive this vaccine at local pharmacy or Health Dept.or vaccine clinic. Aware to provide a copy of the vaccination record if obtained from local pharmacy or Health Dept. Verbalized acceptance and understanding.  Qualifies for Shingles Vaccine? Yes   Zostavax completed No   Shingrix Completed?: No.    Education has been provided regarding the importance of this vaccine. Patient has been advised to call insurance company to determine out of pocket expense if they have not yet received this vaccine. Advised may also receive vaccine at local pharmacy or Health Dept. Verbalized acceptance and understanding.  Screening Tests Health Maintenance  Topic Date Due   COVID-19 Vaccine (1) Never done   Zoster Vaccines- Shingrix (1 of 2) Never done   INFLUENZA VACCINE  04/20/2023   Colonoscopy  10/10/2023   Medicare Annual Wellness (AWV)  10/02/2024   DTaP/Tdap/Td (3 - Td or Tdap) 05/10/2025   Pneumonia Vaccine 40+ Years old  Completed   Hepatitis C Screening  Completed   HPV VACCINES  Aged Out    Health Maintenance  Health Maintenance Due  Topic Date Due    COVID-19 Vaccine (1) Never done   Zoster Vaccines- Shingrix (1 of 2) Never done   INFLUENZA VACCINE  04/20/2023   Colonoscopy  10/10/2023    Colorectal cancer screening: Type of screening: Colonoscopy. Completed 10/09/18. Repeat every 5 years  Lung Cancer Screening: (Low Dose CT Chest recommended if Age 27-80 years, 20 pack-year currently smoking OR have quit w/in 15years.) does not qualify.    Additional Screening:  Hepatitis C Screening: does qualify; Completed 07/23/18  Vision Screening: Recommended annual ophthalmology exams for early detection of glaucoma and other disorders of the eye. Is the patient up to date with their annual eye exam?  Yes  Who is the provider or what is the name of the office in which the patient attends annual eye exams? DR.THURMOND If pt is not established with a provider, would they like to be referred to  a provider to establish care? No .   Dental Screening: Recommended annual dental exams for proper oral hygiene   Community Resource Referral / Chronic Care Management: CRR required this visit?  No   CCM required this visit?  No     Plan:     I have personally reviewed and noted the following in the patient's chart:   Medical and social history Use of alcohol, tobacco or illicit drugs  Current medications and supplements including opioid prescriptions. Patient is not currently taking opioid prescriptions. Functional ability and status Nutritional status Physical activity Advanced directives List of other physicians Hospitalizations, surgeries, and ER visits in previous 12 months Vitals Screenings to include cognitive, depression, and falls Referrals and appointments  In addition, I have reviewed and discussed with patient certain preventive protocols, quality metrics, and best practice recommendations. A written personalized care plan for preventive services as well as general preventive health recommendations were provided to patient.      Jhonnie GORMAN Das, LPN   [id number]   After Visit Summary: (MyChart) Due to this being a telephonic visit, the after visit summary with patients personalized plan was offered to patient via MyChart   Nurse Notes: REFERRAL FOR COLONOSCOPY SENT

## 2023-10-03 NOTE — Patient Instructions (Addendum)
 Mr. Tyler [name] , Thank you for taking time to come for your Medicare Wellness Visit. I appreciate your ongoing commitment to your health goals. Please review the following plan we discussed and let me know if I can assist you in the future.   Referrals/Orders/Follow-Ups/Clinician Recommendations: REFERRAL FOR COLONOSCOPY SENT   This is a list of the screening recommended for you and due dates:  Health Maintenance  Topic Date Due   COVID-19 Vaccine (1) Never done   Zoster (Shingles) Vaccine (1 of 2) Never done   Flu Shot  04/20/2023   Colon Cancer Screening  10/10/2023   Medicare Annual Wellness Visit  10/02/2024   DTaP/Tdap/Td vaccine (3 - Td or Tdap) 05/10/2025   Pneumonia Vaccine  Completed   Hepatitis C Screening  Completed   HPV Vaccine  Aged Out    Advanced directives: (ACP Link)Information on Advanced Care Planning can be found at [place]  Secretary of [facility] Advance Health Care Directives Advance Health Care Directives ([email or web address]   Next Medicare Annual Wellness Visit scheduled for next year: Yes   10/08/24 @ 1:10 PM BY VIDEO

## 2023-10-05 ENCOUNTER — Other Ambulatory Visit: Payer: Self-pay

## 2023-10-05 ENCOUNTER — Telehealth: Payer: Self-pay

## 2023-10-05 DIAGNOSIS — Z860100 Personal history of colon polyps, unspecified: Secondary | ICD-10-CM

## 2023-10-05 MED ORDER — NA SULFATE-K SULFATE-MG SULF 17.5-3.13-1.6 GM/177ML PO SOLN: 1.0000 | Freq: Once | ORAL | 0 refills | Status: AC

## 2023-10-05 NOTE — Telephone Encounter (Signed)
Gastroenterology Pre-Procedure Review  Request Date: 10/31/23 Requesting Physician: Dr. Servando Snare  PATIENT REVIEW QUESTIONS: The patient responded to the following health history questions as indicated:    1. Are you having any GI issues? no 2. Do you have a personal history of Polyps? no 3. Do you have a family history of Colon Cancer or Polyps? no 4. Diabetes Mellitus? no 5. Joint replacements in the past 12 months?no 6. Major health problems in the past 3 months?no 7. Any artificial heart valves, MVP, or defibrillator?no    MEDICATIONS & ALLERGIES:    Patient reports the following regarding taking any anticoagulation/antiplatelet therapy:   Plavix, Coumadin, Eliquis, Xarelto, Lovenox, Pradaxa, Brilinta, or Effient? no Aspirin? no  Patient confirms/reports the following medications:  Current Outpatient Medications  Medication Sig Dispense Refill   amLODipine (NORVASC) 10 MG tablet Take 1 tablet (10 mg total) by mouth daily. 90 tablet 1   hydrochlorothiazide (HYDRODIURIL) 25 MG tablet Take 1 tablet (25 mg total) by mouth daily. 90 tablet 1   losartan (COZAAR) 100 MG tablet TAKE 1 TABLET BY MOUTH EVERY DAY 90 tablet 1   rosuvastatin (CRESTOR) 20 MG tablet Take 1 tablet (20 mg total) by mouth daily. 90 tablet 1   No current facility-administered medications for this visit.    Patient confirms/reports the following allergies:  No Known Allergies  No orders of the defined types were placed in this encounter.   AUTHORIZATION INFORMATION Primary Insurance: 1D#: Group #:  Secondary Insurance: 1D#: Group #:  SCHEDULE INFORMATION: Date:  Time: Location:

## 2023-10-05 NOTE — Telephone Encounter (Signed)
Correction:  2. Do you have a personal history of Polyps? YES Last colonoscopy performed by Dr. Servando Snare on 10/09/18.  Recommended repeat in 5 years.  3mm polyp transverse colon.  Thanks,  [place], [place]

## 2023-10-05 NOTE — Telephone Encounter (Signed)
The patient called in to schedule his colonoscopy.

## 2023-10-31 ENCOUNTER — Encounter
Admission: RE | Disposition: A | Discharge status: Home / Self Care | Payer: Self-pay | Source: Home / Self Care | Attending: Gastroenterology

## 2023-10-31 ENCOUNTER — Encounter: Payer: Self-pay | Admitting: Gastroenterology

## 2023-10-31 ENCOUNTER — Ambulatory Visit
Admission: RE | Admit: 2023-10-31 | Discharge: 2023-10-31 | Disposition: A | Discharge status: Home / Self Care | Payer: Medicare HMO | Attending: Gastroenterology | Admitting: Gastroenterology

## 2023-10-31 ENCOUNTER — Ambulatory Visit: Payer: Medicare HMO | Admitting: Anesthesiology

## 2023-10-31 DIAGNOSIS — I10 Essential (primary) hypertension: Secondary | ICD-10-CM | POA: Insufficient documentation

## 2023-10-31 DIAGNOSIS — D122 Benign neoplasm of ascending colon: Secondary | ICD-10-CM | POA: Diagnosis not present

## 2023-10-31 DIAGNOSIS — D124 Benign neoplasm of descending colon: Secondary | ICD-10-CM | POA: Diagnosis not present

## 2023-10-31 DIAGNOSIS — Z860100 Personal history of colon polyps, unspecified: Secondary | ICD-10-CM

## 2023-10-31 DIAGNOSIS — K635 Polyp of colon: Secondary | ICD-10-CM | POA: Diagnosis not present

## 2023-10-31 DIAGNOSIS — Z1211 Encounter for screening for malignant neoplasm of colon: Secondary | ICD-10-CM | POA: Insufficient documentation

## 2023-10-31 HISTORY — PX: COLONOSCOPY WITH PROPOFOL: SHX5780

## 2023-10-31 HISTORY — PX: POLYPECTOMY: SHX5525

## 2023-10-31 SURGERY — COLONOSCOPY WITH PROPOFOL
Anesthesia: General

## 2023-10-31 MED ORDER — DEXMEDETOMIDINE HCL IN NACL 80 MCG/20ML IV SOLN
INTRAVENOUS | Status: DC | PRN
  Administered 2023-10-31: 20 ug via INTRAVENOUS

## 2023-10-31 MED ORDER — SODIUM CHLORIDE 0.9% FLUSH: 3.0000 mL | INTRAVENOUS | Status: DC | PRN

## 2023-10-31 MED ORDER — LIDOCAINE HCL (CARDIAC) PF 100 MG/5ML IV SOSY
PREFILLED_SYRINGE | INTRAVENOUS | Status: DC | PRN
  Administered 2023-10-31: 100 mg via INTRAVENOUS

## 2023-10-31 MED ORDER — PROPOFOL 500 MG/50ML IV EMUL
INTRAVENOUS | Status: DC | PRN
  Administered 2023-10-31: 75 ug/kg/min via INTRAVENOUS

## 2023-10-31 MED ORDER — EPHEDRINE SULFATE-NACL 50-0.9 MG/10ML-% IV SOSY
PREFILLED_SYRINGE | INTRAVENOUS | Status: DC | PRN
  Administered 2023-10-31 (×2): 10 mg via INTRAVENOUS

## 2023-10-31 MED ORDER — PROPOFOL 1000 MG/100ML IV EMUL
INTRAVENOUS | Status: AC
  Filled 2023-10-31: qty 100

## 2023-10-31 MED ORDER — SODIUM CHLORIDE 0.9 % IV SOLN: INTRAVENOUS | Status: DC

## 2023-10-31 MED ORDER — PROPOFOL 10 MG/ML IV BOLUS
INTRAVENOUS | Status: DC | PRN
  Administered 2023-10-31: 50 mg via INTRAVENOUS

## 2023-10-31 MED ORDER — SODIUM CHLORIDE 0.9% FLUSH: 3.0000 mL | Freq: Two times a day (BID) | INTRAVENOUS | Status: DC

## 2023-10-31 MED ORDER — LIDOCAINE HCL (PF) 2 % IJ SOLN
INTRAMUSCULAR | Status: AC
  Filled 2023-10-31: qty 10

## 2023-10-31 NOTE — Anesthesia Postprocedure Evaluation (Signed)
Anesthesia Post Note  Patient: Tyler [name]  Procedure(s) Performed: COLONOSCOPY WITH PROPOFOL POLYPECTOMY  Patient location during evaluation: Endoscopy Anesthesia Type: General Level of consciousness: awake and alert Pain management: pain level controlled Vital Signs Assessment: post-procedure vital signs reviewed and stable Respiratory status: spontaneous breathing, nonlabored ventilation, respiratory function stable and patient connected to nasal cannula oxygen Cardiovascular status: blood pressure returned to baseline and stable Postop Assessment: no apparent nausea or vomiting Anesthetic complications: no   No notable events documented.   Last Vitals:  Vitals:   10/31/23 1034 10/31/23 1039  BP: 111/77   Pulse: 72 72  Resp:    Temp:    SpO2: 98%     Last Pain:  Vitals:   10/31/23 1034  TempSrc:   PainSc: 0-No pain                 Cleda Mccreedy Khiem Gargis

## 2023-10-31 NOTE — Transfer of Care (Signed)
Immediate Anesthesia Transfer of Care Note  Patient: Tyler [name]  Procedure(s) Performed: COLONOSCOPY WITH PROPOFOL POLYPECTOMY  Patient Location: PACU  Anesthesia Type:General  Level of Consciousness: sedated  Airway & Oxygen Therapy: Patient Spontanous Breathing  Post-op Assessment: Report given to RN  Post vital signs: Reviewed and stable  Last Vitals:  Vitals Value Taken Time  BP    Temp    Pulse 81 10/31/23 1025  Resp 18 10/31/23 1025  SpO2 95 % 10/31/23 1025  Vitals shown include unfiled device data.  Last Pain:  Vitals:   10/31/23 0905  TempSrc: Temporal  PainSc: 0-No pain         Complications: No notable events documented.

## 2023-10-31 NOTE — Anesthesia Preprocedure Evaluation (Signed)
Anesthesia Evaluation  Patient identified by MRN, date of birth, ID band Patient awake    Reviewed: Allergy & Precautions, NPO status , Patient's Chart, lab work & pertinent test results  History of Anesthesia Complications Negative for: history of anesthetic complications  Airway Mallampati: III  TM Distance: <3 FB Neck ROM: full    Dental  (+) Chipped   Pulmonary neg pulmonary ROS, neg shortness of breath   Pulmonary exam normal        Cardiovascular Exercise Tolerance: Good hypertension, (-) angina Normal cardiovascular exam     Neuro/Psych negative neurological ROS  negative psych ROS   GI/Hepatic negative GI ROS, Neg liver ROS,neg GERD  ,,  Endo/Other  negative endocrine ROS    Renal/GU negative Renal ROS  negative genitourinary   Musculoskeletal   Abdominal   Peds  Hematology negative hematology ROS (+)   Anesthesia Other Findings Past Medical History: 02/09/2009: Dysplastic nevus     Comment:  L upper back med to mid scapula - mod 02/09/2009: Dysplastic nevus     Comment:  R bicep - mod 04/08/2009: Dysplastic nevus     Comment:  L upper back med to mid scapula 7.0 cm lat to spine -               excision 04/08/2009: Dysplastic nevus     Comment:  Med and inf to L upper back med to med scapula 7.0 cm               lat to spine - mod, excsion 06/03/2009 09/22/2009: Dysplastic nevus     Comment:  R upper back - mild 09/22/2009: Dysplastic nevus     Comment:  L upper back sup 4.0 cm lat to spine - mild 09/22/2009: Dysplastic nevus     Comment:  L upper back inf 4.0 cm lat to spine - mild 09/22/2009: Dysplastic nevus     Comment:  R side 14.0 cm above waistline - mild 07/15/2013: Dysplastic nevus     Comment:  Midline chest - mod 12/18/2017: Dysplastic nevus     Comment:  R ant lat deltoid - moderate 01/30/2018: Dysplastic nevus     Comment:  L med calf near popliteal - severe 01/30/2018:  Dysplastic nevus     Comment:  L post lat base of neck - severe 01/30/2018: Dysplastic nevus     Comment:  L 5.0 cm lat to spine upper back med to mid scapula -               mod to severe No date: History of pilonidal cyst No date: Hyperlipidemia No date: Hypertension 2008: Laryngeal cancer (HCC)     Comment:  Treated with Chemo and radiation at [facility]  Past Surgical History: No date: COLONOSCOPY 10/09/2018: COLONOSCOPY WITH PROPOFOL; N/A     Comment:  Procedure: COLONOSCOPY WITH PROPOFOL;  Surgeon: [name], MD;  [facility];  Service:               Endoscopy;  Laterality: N/A; No date: KNEE ARTHROSCOPY; Right 2008: NASAL SEPTUM SURGERY 2008: NASAL SINUS SURGERY No date: PILONIDAL CYST EXCISION No date: VASECTOMY  BMI    Body Mass Index: 36.35 kg/m      Reproductive/Obstetrics negative OB ROS  Anesthesia Physical Anesthesia Plan  ASA: 2  Anesthesia Plan: General   Post-op Pain Management:    Induction: Intravenous  PONV Risk Score and Plan: Propofol infusion and TIVA  Airway Management Planned: Natural Airway and Nasal Cannula  Additional Equipment:   Intra-op Plan:   Post-operative Plan:   Informed Consent: I have reviewed the patients History and Physical, chart, labs and discussed the procedure including the risks, benefits and alternatives for the proposed anesthesia with the patient or authorized representative who has indicated his/her understanding and acceptance.     Dental Advisory Given  Plan Discussed with: Anesthesiologist, CRNA and Surgeon  Anesthesia Plan Comments: (Patient consented for risks of anesthesia including but not limited to:  - adverse reactions to medications - risk of airway placement if required - damage to eyes, teeth, lips or other oral mucosa - nerve damage due to positioning  - sore throat or hoarseness - Damage to heart, brain, nerves,  lungs, other parts of body or loss of life  Patient voiced understanding and assent.)       Anesthesia Quick Evaluation

## 2023-10-31 NOTE — Op Note (Signed)
[facility] Gastroenterology Patient Name: Tyler [name] Procedure Date: 10/31/2023 9:54 AM MRN: [id number] Account #: [id number] Date of Birth: 1951/04/27 Admit Type: Outpatient Age: 73 Room: [facility] ENDO ROOM 4 Gender: Male Note Status: Finalized Instrument Name: Nelda Marseille [id number] Procedure:             Colonoscopy Indications:           High risk colon cancer surveillance: Personal history                         of colonic polyps Providers:             [name] MD, MD Referring MD:          [name] MD, MD (Referring MD), Marzella Schlein.                         Bacigalupo (Referring MD) Medicines:             Propofol per Anesthesia Complications:         No immediate complications. Procedure:             Pre-Anesthesia Assessment:                        - Prior to the procedure, a History and Physical was                         performed, and patient medications and allergies were                         reviewed. The patient's tolerance of previous                         anesthesia was also reviewed. The risks and benefits                         of the procedure and the sedation options and risks                         were discussed with the patient. All questions were                         answered, and informed consent was obtained. Prior                         Anticoagulants: The patient has taken no anticoagulant                         or antiplatelet agents. ASA Grade Assessment: II - A                         patient with mild systemic disease. After reviewing                         the risks and benefits, the patient was deemed in                         satisfactory condition to undergo the procedure.  After obtaining informed consent, the colonoscope was                         passed under direct vision. Throughout the procedure,                         the patient's blood pressure, pulse, and oxygen                          saturations were monitored continuously. The                         Colonoscope was introduced through the anus and                         advanced to the the cecum, identified by appendiceal                         orifice and ileocecal valve. The colonoscopy was                         performed without difficulty. The patient tolerated                         the procedure well. The quality of the bowel                         preparation was poor. Findings:      The perianal and digital rectal examinations were normal.      Two sessile polyps were found in the ascending colon. The polyps were 3       to 9 mm in size. These polyps were removed with a cold snare. Resection       and retrieval were complete.      Three sessile polyps were found in the descending colon. The polyps were       3 to 5 mm in size. These polyps were removed with a cold snare.       Resection and retrieval were complete.      A moderate amount of semi-solid stool was found in the rectum and in the       sigmoid colon. Impression:            - Preparation of the colon was poor.                        - Two 3 to 9 mm polyps in the ascending colon, removed                         with a cold snare. Resected and retrieved.                        - Three 3 to 5 mm polyps in the descending colon,                         removed with a cold snare. Resected and retrieved.                        - Stool in the rectum and in  the sigmoid colon. Recommendation:        - Discharge patient to home.                        - Resume previous diet.                        - Continue present medications.                        - Await pathology results.                        - Repeat colonoscopy in 1 year for surveillance due to                         poor prep. Procedure Code(s):     --- Professional ---                        [phone], Colonoscopy, flexible; with removal of                         tumor(s), polyp(s), or  other lesion(s) by snare                         technique Diagnosis Code(s):     --- Professional ---                        Z86.010, Personal history of colonic polyps                        D12.4, Benign neoplasm of descending colon CPT copyright 2022 American Medical Association. All rights reserved. The codes documented in this report are preliminary and upon coder review may  be revised to meet current compliance requirements. [name] MD, MD 10/31/2023 10:23:25 AM This report has been signed electronically. Number of Addenda: 0 Note Initiated On: 10/31/2023 9:54 AM Scope Withdrawal Time: 0 hours 11 minutes 45 seconds  Total Procedure Duration: 0 hours 20 minutes 46 seconds  Estimated Blood Loss:  Estimated blood loss: none.      [facility]

## 2023-10-31 NOTE — H&P (Signed)
[name], MD [facility] [address] [place], [place] [address] Phone:[phone] Fax : [phone]  Primary Care Physician:  Erasmo Downer, MD Primary Gastroenterologist:  Dr. Servando Snare  Pre-Procedure History & Physical: HPI:  Tyler [name] is a 73 y.o. male is here for an colonoscopy.   Past Medical History:  Diagnosis Date   Dysplastic nevus 02/09/2009   L upper back med to mid scapula - mod   Dysplastic nevus 02/09/2009   R bicep - mod   Dysplastic nevus 04/08/2009   L upper back med to mid scapula 7.0 cm lat to spine - excision   Dysplastic nevus 04/08/2009   Med and inf to L upper back med to med scapula 7.0 cm lat to spine - mod, excsion 06/03/2009   Dysplastic nevus 09/22/2009   R upper back - mild   Dysplastic nevus 09/22/2009   L upper back sup 4.0 cm lat to spine - mild   Dysplastic nevus 09/22/2009   L upper back inf 4.0 cm lat to spine - mild   Dysplastic nevus 09/22/2009   R side 14.0 cm above waistline - mild   Dysplastic nevus 07/15/2013   Midline chest - mod   Dysplastic nevus 12/18/2017   R ant lat deltoid - moderate   Dysplastic nevus 01/30/2018   L med calf near popliteal - severe   Dysplastic nevus 01/30/2018   L post lat base of neck - severe   Dysplastic nevus 01/30/2018   L 5.0 cm lat to spine upper back med to mid scapula - mod to severe   History of pilonidal cyst    Hyperlipidemia    Hypertension    Laryngeal cancer (HCC) 2008   Treated with Chemo and radiation at [facility]    Past Surgical History:  Procedure Laterality Date   COLONOSCOPY     COLONOSCOPY WITH PROPOFOL N/A 10/09/2018   Procedure: COLONOSCOPY WITH PROPOFOL;  Surgeon: [name], MD;  Location: [facility] ENDOSCOPY;  Service: Endoscopy;  Laterality: N/A;   KNEE ARTHROSCOPY Right    NASAL SEPTUM SURGERY  2008   NASAL SINUS SURGERY  2008   PILONIDAL CYST EXCISION     VASECTOMY      Prior to Admission medications   Medication Sig Start Date End Date Taking?  Authorizing Provider  rosuvastatin (CRESTOR) 20 MG tablet Take 1 tablet (20 mg total) by mouth daily. 11/18/21  Yes Bacigalupo, Marzella Schlein, MD  amLODipine (NORVASC) 10 MG tablet Take 1 tablet (10 mg total) by mouth daily. Patient not taking: Reported on 10/31/2023 11/18/21   Erasmo Downer, MD  hydrochlorothiazide (HYDRODIURIL) 25 MG tablet Take 1 tablet (25 mg total) by mouth daily. Patient not taking: Reported on 10/31/2023 11/18/21   Erasmo Downer, MD  losartan (COZAAR) 100 MG tablet TAKE 1 TABLET BY MOUTH EVERY DAY Patient not taking: Reported on 10/31/2023 12/15/22   Erasmo Downer, MD    Allergies as of 10/05/2023   (No Known Allergies)    Family History  Problem Relation Age of Onset   Heart disease Mother    Hyperlipidemia Mother    Hypertension Mother    CVA Mother 3   Leukemia Father    Diabetes Brother    Colon cancer Brother 66   Healthy Daughter    Healthy Daughter    Colon cancer Paternal Uncle     Social History   Socioeconomic History   Marital status: Married    Spouse name: Not on file  Number of children: 2   Years of education: Not on file   Highest education level: Some college, no degree  Occupational History   Occupation: retired Advertising account planner  Tobacco Use   Smoking status: Never   Smokeless tobacco: Never  Vaping Use   Vaping status: Never Used  Substance and Sexual Activity   Alcohol use: Yes    Alcohol/week: 3.0 standard drinks of alcohol    Types: 3 Cans of beer per week   Drug use: Never   Sexual activity: Yes    Partners: Female    Birth control/protection: Surgical  Other Topics Concern   Not on file  Social History Narrative   Not on file   Social Drivers of Health   Financial Resource Strain: Low Risk  (10/03/2023)   Overall Financial Resource Strain (CARDIA)    Difficulty of Paying Living Expenses: Not hard at all  Food Insecurity: No Food Insecurity (10/03/2023)   Hunger Vital Sign    Worried About Running Out  of Food in the Last Year: Never true    Ran Out of Food in the Last Year: Never true  Transportation Needs: No Transportation Needs (10/02/2023)   PRAPARE - Administrator, Civil Service (Medical): No    Lack of Transportation (Non-Medical): No  Physical Activity: Sufficiently Active (10/03/2023)   Exercise Vital Sign    Days of Exercise per Week: 3 days    Minutes of Exercise per Session: 90 min  Stress: No Stress Concern Present (10/03/2023)   [employer] of Occupational Health - Occupational Stress Questionnaire    Feeling of Stress : Not at all  Social Connections: Socially Integrated (10/03/2023)   Social Connection and Isolation Panel [NHANES]    Frequency of Communication with Friends and Family: More than three times a week    Frequency of Social Gatherings with Friends and Family: Once a week    Attends Religious Services: More than 4 times per year    Active Member of [employer] or Organizations: Yes    Attends Engineer, structural: More than 4 times per year    Marital Status: Married  Catering manager Violence: Not At Risk (10/03/2023)   Humiliation, Afraid, Rape, and Kick questionnaire    Fear of Current or Ex-Partner: No    Emotionally Abused: No    Physically Abused: No    Sexually Abused: No    Review of Systems: See HPI, otherwise negative ROS  Physical Exam: BP (!) 158/77   Pulse 78   Temp 97.7 F (36.5 C) (Temporal)   Resp 17   Ht 6' (1.829 m)   Wt 121.6 kg   SpO2 97%   BMI 36.35 kg/m  General:   Alert,  pleasant and cooperative in NAD Head:  Normocephalic and atraumatic. Neck:  Supple; no masses or thyromegaly. Lungs:  Clear throughout to auscultation.    Heart:  Regular rate and rhythm. Abdomen:  Soft, nontender and nondistended. Normal bowel sounds, without guarding, and without rebound.   Neurologic:  Alert and  oriented x4;  grossly normal neurologically.  Impression/Plan: [name] is here for an colonoscopy to be  performed for a history of adenomatous polyps on 2020   Risks, benefits, limitations, and alternatives regarding  colonoscopy have been reviewed with the patient.  Questions have been answered.  All parties agreeable.   [name], MD  10/31/2023, 9:24 AM

## 2023-11-01 ENCOUNTER — Encounter: Payer: Self-pay | Admitting: Gastroenterology

## 2023-11-01 LAB — SURGICAL PATHOLOGY

## 2023-11-03 ENCOUNTER — Encounter: Payer: Self-pay | Admitting: Gastroenterology

## 2024-01-24 ENCOUNTER — Ambulatory Visit: Payer: Medicare HMO | Admitting: Dermatology

## 2024-02-26 ENCOUNTER — Ambulatory Visit: Admitting: Dermatology

## 2024-02-26 ENCOUNTER — Encounter: Payer: Self-pay | Admitting: Dermatology

## 2024-02-26 DIAGNOSIS — W908XXA Exposure to other nonionizing radiation, initial encounter: Secondary | ICD-10-CM | POA: Diagnosis not present

## 2024-02-26 DIAGNOSIS — D229 Melanocytic nevi, unspecified: Secondary | ICD-10-CM

## 2024-02-26 DIAGNOSIS — D692 Other nonthrombocytopenic purpura: Secondary | ICD-10-CM

## 2024-02-26 DIAGNOSIS — L578 Other skin changes due to chronic exposure to nonionizing radiation: Secondary | ICD-10-CM

## 2024-02-26 DIAGNOSIS — L814 Other melanin hyperpigmentation: Secondary | ICD-10-CM | POA: Diagnosis not present

## 2024-02-26 DIAGNOSIS — Z1283 Encounter for screening for malignant neoplasm of skin: Secondary | ICD-10-CM

## 2024-02-26 DIAGNOSIS — L570 Actinic keratosis: Secondary | ICD-10-CM | POA: Diagnosis not present

## 2024-02-26 DIAGNOSIS — Z86018 Personal history of other benign neoplasm: Secondary | ICD-10-CM

## 2024-02-26 DIAGNOSIS — L820 Inflamed seborrheic keratosis: Secondary | ICD-10-CM

## 2024-02-26 DIAGNOSIS — L821 Other seborrheic keratosis: Secondary | ICD-10-CM

## 2024-02-26 DIAGNOSIS — D1801 Hemangioma of skin and subcutaneous tissue: Secondary | ICD-10-CM | POA: Diagnosis not present

## 2024-02-26 NOTE — Progress Notes (Signed)
 Follow-Up Visit   Subjective  Tyler [name] is a 73 y.o. male who presents for the following: Skin Cancer Screening and Full Body Skin Exam, hx of Dysplastic nevus   The patient presents for Total-Body Skin Exam (TBSE) for skin cancer screening and mole check. The patient has spots, moles and lesions to be evaluated, some may be new or changing and the patient may have concern these could be cancer.  The following portions of the chart were reviewed this encounter and updated as appropriate: medications, allergies, medical history  Review of Systems:  No other skin or systemic complaints except as noted in HPI or Assessment and Plan.  Objective  Well appearing patient in no apparent distress; mood and affect are within normal limits.  A full examination was performed including scalp, head, eyes, ears, nose, lips, neck, chest, axillae, abdomen, back, buttocks, bilateral upper extremities, bilateral lower extremities, hands, feet, fingers, toes, fingernails, and toenails. All findings within normal limits unless otherwise noted below.   Relevant physical exam findings are noted in the Assessment and Plan.  right thigh, left temple, back (8) Stuck-on, waxy, tan-brown papules and plaques -- Discussed benign etiology and prognosis.  right forearm, right leg (2) Erythematous thin papules/macules with gritty scale.   Assessment & Plan   SKIN CANCER SCREENING PERFORMED TODAY.  LENTIGINES, SEBORRHEIC KERATOSES, HEMANGIOMAS - Benign normal skin lesions - Benign-appearing - Call for any changes  MELANOCYTIC NEVI - Tan-brown and/or pink-flesh-colored symmetric macules and papules - Benign appearing on exam today - Observation - Call clinic for new or changing moles - Recommend daily use of broad spectrum spf 30+ sunscreen to sun-exposed areas.   Purpura - Chronic; persistent and recurrent.  Treatable, but not curable. - Violaceous macules and patches - Benign - Related to trauma,  age, sun damage and/or use of blood thinners, chronic use of topical and/or oral steroids - Observe - Can use OTC arnica containing moisturizer such as Dermend Bruise Formula if desired - Call for worsening or other concerns   HISTORY OF DYSPLASTIC NEVUS No evidence of recurrence today Recommend regular full body skin exams Recommend daily broad spectrum sunscreen SPF 30+ to sun-exposed areas, reapply every 2 hours as needed.  Call if any new or changing lesions are noted between office visits   INFLAMED SEBORRHEIC KERATOSIS (8) right thigh, left temple, back (8) Symptomatic, irritating, patient would like treated.  Destruction of lesion - right thigh, left temple, back (8) Complexity: simple   Destruction method: cryotherapy   Informed consent: discussed and consent obtained   Timeout:  patient name, date of birth, surgical site, and procedure verified Lesion destroyed using liquid nitrogen: Yes   Region frozen until ice ball extended beyond lesion: Yes   Outcome: patient tolerated procedure well with no complications   Post-procedure details: wound care instructions given   AK (ACTINIC KERATOSIS) (2) right forearm, right leg (2) ACTINIC DAMAGE - chronic, secondary to cumulative UV radiation exposure/sun exposure over time - diffuse scaly erythematous macules with underlying dyspigmentation - Recommend daily broad spectrum sunscreen SPF 30+ to sun-exposed areas, reapply every 2 hours as needed.  - Recommend staying in the shade or wearing long sleeves, sun glasses (UVA+UVB protection) and wide brim hats (4-inch brim around the entire circumference of the hat). - Call for new or changing lesions.  Destruction of lesion - right forearm, right leg (2) Complexity: simple   Destruction method: cryotherapy   Informed consent: discussed and consent obtained   Timeout:  patient name, date of birth, surgical site, and procedure verified Lesion destroyed using liquid nitrogen: Yes    Region frozen until ice ball extended beyond lesion: Yes   Outcome: patient tolerated procedure well with no complications   Post-procedure details: wound care instructions given     Return in about 1 year (around 02/25/2025) for TBSE, hx of Dysplastic nevus .  IClara Crisp, CMA, am acting as scribe for Celine Collard, MD .   Documentation: I have reviewed the above documentation for accuracy and completeness, and I agree with the above.  Celine Collard, MD

## 2024-02-26 NOTE — Patient Instructions (Addendum)

## 2024-04-18 ENCOUNTER — Encounter: Payer: Self-pay | Admitting: Family Medicine

## 2024-04-18 ENCOUNTER — Ambulatory Visit (INDEPENDENT_AMBULATORY_CARE_PROVIDER_SITE_OTHER): Admitting: Family Medicine

## 2024-04-18 VITALS — BP 152/99 | HR 76 | Temp 98.5°F | Ht 72.0 in | Wt 262.7 lb

## 2024-04-18 DIAGNOSIS — R7303 Prediabetes: Secondary | ICD-10-CM

## 2024-04-18 DIAGNOSIS — Z6835 Body mass index (BMI) 35.0-35.9, adult: Secondary | ICD-10-CM

## 2024-04-18 DIAGNOSIS — Z0000 Encounter for general adult medical examination without abnormal findings: Secondary | ICD-10-CM | POA: Diagnosis not present

## 2024-04-18 DIAGNOSIS — E782 Mixed hyperlipidemia: Secondary | ICD-10-CM

## 2024-04-18 DIAGNOSIS — G250 Essential tremor: Secondary | ICD-10-CM | POA: Insufficient documentation

## 2024-04-18 DIAGNOSIS — I10 Essential (primary) hypertension: Secondary | ICD-10-CM | POA: Diagnosis not present

## 2024-04-18 DIAGNOSIS — E66812 Obesity, class 2: Secondary | ICD-10-CM

## 2024-04-18 MED ORDER — AMLODIPINE BESYLATE 10 MG PO TABS: 10.0000 mg | ORAL_TABLET | Freq: Every day | ORAL | 1 refills | Status: AC

## 2024-04-18 MED ORDER — ROSUVASTATIN CALCIUM 20 MG PO TABS: 20.0000 mg | ORAL_TABLET | Freq: Every day | ORAL | 1 refills | Status: AC

## 2024-04-18 MED ORDER — HYDROCHLOROTHIAZIDE 25 MG PO TABS: 25.0000 mg | ORAL_TABLET | Freq: Every day | ORAL | 1 refills | Status: AC

## 2024-04-18 MED ORDER — LOSARTAN POTASSIUM 100 MG PO TABS: 100.0000 mg | ORAL_TABLET | Freq: Every day | ORAL | 1 refills | Status: AC

## 2024-04-18 NOTE — Progress Notes (Unsigned)
 Complete physical exam   Patient: Tyler [name]   DOB: May 21, 1951   73 y.o. Male  MRN: [id number] Visit Date: 04/18/2024  Today's healthcare provider: Jon Eva, MD   Chief Complaint  Patient presents with   Annual Exam    Diet: Normal, no changes Exercise: plays golf 2x week, 4 hours at a time Sleep: Great Overall Feeling: Overall good  Declines shingles vaccine at this time    Tremors    Notices tremor in Right hand onset over 6 months, states he notices it mostly with writing and using fork/ eating. No tremor with grip. Has noticed that it progressed over the past few months    Subjective    Tyler [name] is a 73 y.o. male who presents today for a complete physical exam.   Discussed the use of AI scribe software for clinical note transcription with the patient, who gave verbal consent to proceed.  History of Present Illness   Tyler [name] is a 73 year old male with hypertension, hyperlipidemia, and prediabetes who presents for medication refills and evaluation of a tremor.  He has been without amlodipine , hydrochlorothiazide , losartan , and rosuvastatin  for about 30 days due to running out of prescriptions. A tremor began in the fall of last year, primarily affecting his right hand during fine motor tasks such as holding a spoon or pencil. The tremor occurs when holding objects at the end of his fingers, but not when grasping them. He uses his left hand for some tasks. No other neurological symptoms such as confusion or difficulty walking are present. He experiences cramps in his shins that wake him at night, which he attributes to a lack of potassium. Consuming pickles or pickle juice before bed alleviates the cramps.        Last depression screening scores    04/18/2024    1:51 PM 10/03/2023    1:56 PM 09/29/2022    8:15 AM  PHQ 2/9 Scores  PHQ - 2 Score 0 0 0  PHQ- 9 Score 0 0 0   Last fall risk screening    04/18/2024    1:51 PM  Fall Risk   Falls  in the past year? 0  Number falls in past yr: 0  Injury with Fall? 0  Risk for fall due to : No Fall Risks  Follow up Falls evaluation completed        Medications: Outpatient Medications Prior to Visit  Medication Sig   [DISCONTINUED] amLODipine  (NORVASC ) 10 MG tablet Take 1 tablet (10 mg total) by mouth daily.   [DISCONTINUED] hydrochlorothiazide  (HYDRODIURIL ) 25 MG tablet Take 1 tablet (25 mg total) by mouth daily.   [DISCONTINUED] rosuvastatin  (CRESTOR ) 20 MG tablet Take 1 tablet (20 mg total) by mouth daily.   [DISCONTINUED] losartan  (COZAAR ) 100 MG tablet TAKE 1 TABLET BY MOUTH EVERY DAY (Patient not taking: Reported on 04/18/2024)   No facility-administered medications prior to visit.    Review of Systems    Objective    BP (!) 152/99 (BP Location: Right Arm, Patient Position: Sitting, Cuff Size: Normal)   Pulse 76   Temp 98.5 F (36.9 C) (Oral)   Ht 6' (1.829 m)   Wt 262 lb 11.2 oz (119.2 kg)   SpO2 97%   BMI 35.63 kg/m    Physical Exam Vitals reviewed.  Constitutional:      General: He is not in acute distress.    Appearance: Normal appearance. He is well-developed. He is  not diaphoretic.  HENT:     Head: Normocephalic and atraumatic.     Right Ear: Tympanic membrane, ear canal and external ear normal.     Left Ear: Tympanic membrane, ear canal and external ear normal.     Nose: Nose normal.     Mouth/Throat:     Mouth: Mucous membranes are moist.     Pharynx: Oropharynx is clear. No oropharyngeal exudate.  Eyes:     General: No scleral icterus.    Conjunctiva/sclera: Conjunctivae normal.     Pupils: Pupils are equal, round, and reactive to light.  Neck:     Thyroid: No thyromegaly.  Cardiovascular:     Rate and Rhythm: Normal rate and regular rhythm.     Heart sounds: Normal heart sounds. No murmur heard. Pulmonary:     Effort: Pulmonary effort is normal. No respiratory distress.     Breath sounds: Normal breath sounds. No wheezing or rales.   Abdominal:     General: There is no distension.     Palpations: Abdomen is soft.     Tenderness: There is no abdominal tenderness.  Musculoskeletal:        General: No deformity.     Cervical back: Neck supple.     Right lower leg: No edema.     Left lower leg: No edema.  Lymphadenopathy:     Cervical: No cervical adenopathy.  Skin:    General: Skin is warm and dry.     Findings: No rash.  Neurological:     Mental Status: He is alert and oriented to person, place, and time. Mental status is at baseline.     Gait: Gait normal.     Comments: +action tremor, symmetric in both UEs  Psychiatric:        Mood and Affect: Mood normal.        Behavior: Behavior normal.        Thought Content: Thought content normal.      No results found for any visits on 04/18/24.  Assessment & Plan    Routine Health Maintenance and Physical Exam  Exercise Activities and Dietary recommendations  Goals      Cut out extra servings     DIET - EAT MORE FRUITS AND VEGETABLES     DIET - INCREASE WATER INTAKE     DIET - REDUCE SUGAR INTAKE     Recommend to cut back on sugar foods and sweets to a couple times a week versus daily.         Immunization History  Administered Date(s) Administered   Influenza, High Dose Seasonal PF 07/19/2018, 05/28/2019   Influenza-Unspecified 06/30/2021   Pneumococcal Conjugate-13 07/19/2018   Pneumococcal Polysaccharide-23 10/17/2019   Td 03/04/2008   Tdap 05/11/2015    Health Maintenance  Topic Date Due   INFLUENZA VACCINE  04/19/2024   COVID-19 Vaccine (1) 05/04/2024 (Originally 02/15/1956)   Zoster Vaccines- Shingrix (1 of 2) 07/19/2024 (Originally [birth year]/16/1972)   Medicare Annual Wellness (AWV)  10/02/2024   Colonoscopy  10/30/2024   DTaP/Tdap/Td (3 - Td or Tdap) 05/10/2025   Pneumococcal Vaccine: 50+ Years  Completed   Hepatitis C Screening  Completed   Hepatitis B Vaccines  Aged Out   HPV VACCINES  Aged Out   Meningococcal B Vaccine  Aged Out     Discussed health benefits of physical activity, and encouraged him to engage in regular exercise appropriate for his age and condition.  Problem List Items Addressed This Visit  Cardiovascular and Mediastinum   Essential hypertension   Hypertension is uncontrolled due to non-adherence to medication regimen, with elevated blood pressure likely from being off medications for approximately 30 days. - Refill amlodipine , hydrochlorothiazide , and losartan  prescriptions. - Resume antihypertensive medications. - Follow up in 3 months to reassess blood pressure control.      Relevant Medications   amLODipine  (NORVASC ) 10 MG tablet   hydrochlorothiazide  (HYDRODIURIL ) 25 MG tablet   losartan  (COZAAR ) 100 MG tablet   rosuvastatin  (CRESTOR ) 20 MG tablet   Other Relevant Orders   Comprehensive metabolic panel with GFR     Nervous and Auditory   Essential tremor   Essential tremor primarily affects the right hand with fine motor tasks, not significantly impacting quality of life. Differential diagnosis includes Parkinson's disease, but tremor is not consistent with Parkinson's as it is not a resting tremor and there are no other neurological symptoms. - Monitor tremor for changes or progression. - Observe gait to rule out Parkinsonian features.        Other   Hyperlipidemia   Hyperlipidemia management is suboptimal due to non-adherence to rosuvastatin  for approximately 30 days, with expected elevated cholesterol levels. - Refill rosuvastatin  prescription. - Resume rosuvastatin . - Delay cholesterol lab work for a few weeks until medication is resumed.      Relevant Medications   amLODipine  (NORVASC ) 10 MG tablet   hydrochlorothiazide  (HYDRODIURIL ) 25 MG tablet   losartan  (COZAAR ) 100 MG tablet   rosuvastatin  (CRESTOR ) 20 MG tablet   Other Relevant Orders   Comprehensive metabolic panel with GFR   Lipid panel   Obesity   Discussed importance of healthy weight  management Discussed diet and exercise       Prediabetes   Relevant Orders   Hemoglobin A1c   Other Visit Diagnoses       Encounter for annual physical exam    -  Primary   Relevant Orders   Hemoglobin A1c   Comprehensive metabolic panel with GFR   Lipid panel          Nocturnal leg cramps Nocturnal leg cramps likely related to dehydration and electrolyte imbalance, with improvement reported from dietary changes including increased potassium and magnesium intake, and consumption of pickle juice. - Continue dietary modifications to include potassium and magnesium-rich foods. - Consider using pickle juice or mustard as needed for cramp relief.  - check electrolytes      Return in about 3 months (around 07/19/2024) for chronic disease f/u.     Jon Eva, MD  [facility] [phone] (phone) [phone] (fax)  [facility]

## 2024-04-19 NOTE — Assessment & Plan Note (Signed)
 Hyperlipidemia management is suboptimal due to non-adherence to rosuvastatin  for approximately 30 days, with expected elevated cholesterol levels. - Refill rosuvastatin  prescription. - Resume rosuvastatin . - Delay cholesterol lab work for a few weeks until medication is resumed.

## 2024-04-19 NOTE — Assessment & Plan Note (Signed)
 Hypertension is uncontrolled due to non-adherence to medication regimen, with elevated blood pressure likely from being off medications for approximately 30 days. - Refill amlodipine , hydrochlorothiazide , and losartan  prescriptions. - Resume antihypertensive medications. - Follow up in 3 months to reassess blood pressure control.

## 2024-04-19 NOTE — Assessment & Plan Note (Signed)
 Discussed importance of healthy weight management Discussed diet and exercise

## 2024-04-19 NOTE — Assessment & Plan Note (Signed)
 Essential tremor primarily affects the right hand with fine motor tasks, not significantly impacting quality of life. Differential diagnosis includes Parkinson's disease, but tremor is not consistent with Parkinson's as it is not a resting tremor and there are no other neurological symptoms. - Monitor tremor for changes or progression. - Observe gait to rule out Parkinsonian features.

## 2024-07-23 ENCOUNTER — Ambulatory Visit: Admitting: Family Medicine

## 2024-09-03 ENCOUNTER — Ambulatory Visit: Admitting: Family Medicine

## 2024-09-30 ENCOUNTER — Ambulatory Visit: Admitting: Family Medicine

## 2024-10-08 ENCOUNTER — Ambulatory Visit: Payer: Self-pay

## 2024-10-08 DIAGNOSIS — Z0000 Encounter for general adult medical examination without abnormal findings: Secondary | ICD-10-CM

## 2024-10-08 DIAGNOSIS — Z1211 Encounter for screening for malignant neoplasm of colon: Secondary | ICD-10-CM

## 2024-10-08 NOTE — Patient Instructions (Addendum)
 Tyler [name],  Thank you for taking the time for your Medicare Wellness Visit. I appreciate your continued commitment to your health goals. Please review the care plan we discussed, and feel free to reach out if I can assist you further.  Please note that Annual Wellness Visits do not include a physical exam. Some assessments may be limited, especially if the visit was conducted virtually. If needed, we may recommend an in-person follow-up with your provider.  Ongoing Care Seeing your primary care provider every 3 to 6 months helps us  monitor your health and provide consistent, personalized care. 04/21/25 @ 9:20 AM FOR PHYSICAL W/ DR.[email or web address]  Referrals If a referral was made during today's visit and you haven't received any updates within two weeks, please contact the referred provider directly to check on the status.  REFERRAL TO GI FOR COLONOSCOPY WAS SENT   Recommended Screenings:  Health Maintenance  Topic Date Due   COVID-19 Vaccine (1) Never done   Zoster (Shingles) Vaccine (1 of 2) Never done   Flu Shot  04/19/2024   Medicare Annual Wellness Visit  10/02/2024   Colon Cancer Screening  10/30/2024   DTaP/Tdap/Td vaccine (3 - Td or Tdap) 05/10/2025   Pneumococcal Vaccine for age over 25  Completed   Hepatitis C Screening  Completed   Meningitis B Vaccine  Aged Out     Vision: Annual vision screenings are recommended for early detection of glaucoma, cataracts, and diabetic retinopathy. These exams can also reveal signs of chronic conditions such as diabetes and high blood pressure.  Dental: Annual dental screenings help detect early signs of oral cancer, gum disease, and other conditions linked to overall health, including heart disease and diabetes.  Please see the attached documents for additional preventive care recommendations.   NEXT AWV 10/14/25 @ 10:50 AM BY VIDEO

## 2024-10-08 NOTE — Progress Notes (Signed)
 "  Chief Complaint  Patient presents with   Medicare Wellness     Subjective:   Tyler [name] is a 74 y.o. male who presents for a Medicare Annual Wellness Visit.  Visit info / Clinical Intake: Medicare Wellness Visit Type:: Subsequent Annual Wellness Visit Persons participating in visit and providing information:: patient Medicare Wellness Visit Mode:: Video Since this visit was completed virtually, some vitals may be partially provided or unavailable. Missing vitals are due to the limitations of the virtual format.: Unable to obtain vitals - no equipment If Telephone or Video please confirm:: I connected with patient using audio/video enable telemedicine. I verified patient identity with two identifiers, discussed telehealth limitations, and patient agreed to proceed. Patient Location:: HOME Provider Location:: OFFICE Interpreter Needed?: No Pre-visit prep was completed: yes AWV questionnaire completed by patient prior to visit?: yes Date:: 10/08/24 Living arrangements:: lives with spouse/significant other Patient's Overall Health Status Rating: good Typical amount of pain: none Does pain affect daily life?: no Are you currently prescribed opioids?: no  Dietary Habits and Nutritional Risks How many meals a day?: 3 Eats fruit and vegetables daily?: yes Most meals are obtained by: preparing own meals In the last 2 weeks, have you had any of the following?: none Diabetic:: no  Functional Status Activities of Daily Living (to include ambulation/medication): Independent Ambulation: Independent Medication Administration: Independent Home Management (perform basic housework or laundry): Independent Manage your own finances?: yes Primary transportation is: driving Concerns about vision?: no *vision screening is required for WT[facility]* ([facility]) Concerns about hearing?: no  Fall Screening Falls in the past year?: 0 Number of falls in past year: 0 Was there an  injury with Fall?: 0 Fall Risk Category Calculator: 0 Patient Fall Risk Level: Low Fall Risk  Fall Risk Patient at Risk for Falls Due to: No Fall Risks Fall risk Follow up: Falls evaluation completed; Falls prevention discussed  Home and Transportation Safety: All rugs have non-skid backing?: yes All stairs or steps have railings?: yes Grab bars in the bathtub or shower?: (!) no Have non-skid surface in bathtub or shower?: (!) no Good home lighting?: yes Regular seat belt use?: yes Hospital stays in the last year:: no  Cognitive Assessment Difficulty concentrating, remembering, or making decisions? : no Will 6CIT or Mini Cog be Completed: yes What year is it?: 0 points What month is it?: 0 points Give patient an address phrase to remember (5 components): [address] [place], [place] what time is it?: 0 points Count backwards from 20 to 1: 0 points Say the months of the year in reverse: 0 points Repeat the address phrase from earlier: 0 points 6 CIT Score: 0 points  Advance Directives (For Healthcare) Does Patient Have a Medical Advance Directive?: No Type of Advance Directive: Living will Would patient like information on creating a medical advance directive?: No - Patient declined  Reviewed/Updated  Reviewed/Updated: Reviewed All (Medical, Surgical, Family, Medications, Allergies, Care Teams, Patient Goals)    Allergies (verified) Patient has no known allergies.   Current Medications (verified) Outpatient Encounter Medications as of 10/08/2024  Medication Sig   amLODipine  (NORVASC ) 10 MG tablet Take 1 tablet (10 mg total) by mouth daily.   hydrochlorothiazide  (HYDRODIURIL ) 25 MG tablet Take 1 tablet (25 mg total) by mouth daily.   losartan  (COZAAR ) 100 MG tablet Take 1 tablet (100 mg total) by mouth daily.   rosuvastatin  (CRESTOR ) 20 MG tablet Take 1 tablet (20 mg total) by mouth  daily.   No facility-administered encounter medications on file as of 10/08/2024.     History: Past Medical History:  Diagnosis Date   Dysplastic nevus 02/09/2009   L upper back med to mid scapula - mod   Dysplastic nevus 02/09/2009   R bicep - mod   Dysplastic nevus 04/08/2009   L upper back med to mid scapula 7.0 cm lat to spine - excision   Dysplastic nevus 04/08/2009   Med and inf to L upper back med to med scapula 7.0 cm lat to spine - mod, excsion 06/03/2009   Dysplastic nevus 09/22/2009   R upper back - mild   Dysplastic nevus 09/22/2009   L upper back sup 4.0 cm lat to spine - mild   Dysplastic nevus 09/22/2009   L upper back inf 4.0 cm lat to spine - mild   Dysplastic nevus 09/22/2009   R side 14.0 cm above waistline - mild   Dysplastic nevus 07/15/2013   Midline chest - mod   Dysplastic nevus 12/18/2017   R ant lat deltoid - moderate   Dysplastic nevus 01/30/2018   L med calf near popliteal - severe   Dysplastic nevus 01/30/2018   L post lat base of neck - severe   Dysplastic nevus 01/30/2018   L 5.0 cm lat to spine upper back med to mid scapula - mod to severe   History of pilonidal cyst    Hyperlipidemia    Hypertension    Laryngeal cancer (HCC) 2008   Treated with Chemo and radiation at [facility]   Past Surgical History:  Procedure Laterality Date   COLONOSCOPY     COLONOSCOPY WITH PROPOFOL  N/A 10/09/2018   Procedure: COLONOSCOPY WITH PROPOFOL ;  Surgeon: Jinny Carmine, MD;  Location: [facility] ENDOSCOPY;  Service: Endoscopy;  Laterality: N/A;   COLONOSCOPY WITH PROPOFOL  N/A 10/31/2023   Procedure: COLONOSCOPY WITH PROPOFOL ;  Surgeon: Jinny Carmine, MD;  Location: [facility] ENDOSCOPY;  Service: Endoscopy;  Laterality: N/A;   Dental extractions Bilateral    KNEE ARTHROSCOPY Right    NASAL SEPTUM SURGERY  2008   NASAL SINUS SURGERY  2008   PILONIDAL CYST EXCISION     POLYPECTOMY  10/31/2023   Procedure: POLYPECTOMY;  Surgeon: Jinny Carmine, MD;  Location: [facility] ENDOSCOPY;  Service: Endoscopy;;   VASECTOMY     Family History  Problem Relation Age of  Onset   Heart disease Mother    Hyperlipidemia Mother    Hypertension Mother    CVA Mother 52   Leukemia Father    Cancer Father    Diabetes Brother    Colon cancer Brother 39   Healthy Daughter    Healthy Daughter    Colon cancer Paternal Uncle    Social History   Occupational History   Occupation: retired [employer]vertising account planner  Tobacco Use   Smoking status: Never   Smokeless tobacco: Never  Vaping Use   Vaping status: Never Used  Substance and Sexual Activity   Alcohol use: Yes    Alcohol/week: 3.0 standard drinks of alcohol    Types: 3 Cans of beer per week    Comment: 2-3 beers during the week   Drug use: Never   Sexual activity: Not Currently    Partners: Female    Birth control/protection: Surgical   Tobacco Counseling Counseling given: Not Answered  SDOH Screenings   Food Insecurity: No Food Insecurity (10/08/2024)  Housing: Unknown (10/08/2024)  Transportation Needs: No Transportation Needs (10/08/2024)  Utilities: Not At Risk (10/08/2024)  Alcohol  Screen: Low Risk (10/08/2024)  Depression (PHQ2-9): Low Risk (10/08/2024)  Financial Resource Strain: Low Risk (10/08/2024)  Physical Activity: Insufficiently Active (10/08/2024)  Social Connections: Moderately Isolated (10/08/2024)  Stress: No Stress Concern Present (10/08/2024)  Tobacco Use: Low Risk (10/08/2024)  Health Literacy: Adequate Health Literacy (10/08/2024)   See flowsheets for full screening details  Depression Screen PHQ 2 & 9 Depression Scale- Over the past 2 weeks, how often have you been bothered by any of the following problems? Little interest or pleasure in doing things: 0 Feeling down, depressed, or hopeless (PHQ Adolescent also includes...irritable): 0 PHQ-2 Total Score: 0 Trouble falling or staying asleep, or sleeping too much: 0 Feeling tired or having little energy: 0 Poor appetite or overeating (PHQ Adolescent also includes...weight loss): 0 Feeling bad about yourself - or that you are a failure  or have let yourself or your family down: 0 Trouble concentrating on things, such as reading the newspaper or watching television (PHQ Adolescent also includes...like school work): 0 Moving or speaking so slowly that other people could have noticed. Or the opposite - being so fidgety or restless that you have been moving around a lot more than usual: 0 Thoughts that you would be better off dead, or of hurting yourself in some way: 0 PHQ-9 Total Score: 0 If you checked off any problems, how difficult have these problems made it for you to do your work, take care of things at home, or get along with other people?: Not difficult at all  Depression Treatment Depression Interventions/Treatment : [id number] Score <4 Follow-up Not Indicated     Goals Addressed             This Visit's Progress    Cut out extra servings               Objective:    There were no vitals filed for this visit. There is no height or weight on file to calculate BMI.  Hearing/Vision screen Hearing Screening - Comments:: NO AIDS Vision Screening - Comments:: [facility]- [facility]LY Immunizations and Health Maintenance Health Maintenance  Topic Date Due   COVID-19 Vaccine (1) Never done   Zoster Vaccines- Shingrix (1 of 2) Never done   Influenza Vaccine  04/19/2024   Medicare Annual Wellness (AWV)  10/02/2024   Colonoscopy  10/30/2024   DTaP/Tdap/Td (3 - Td or Tdap) 05/10/2025   Pneumococcal Vaccine: 50+ Years  Completed   Hepatitis C Screening  Completed   Meningococcal B Vaccine  Aged Out        Assessment/Plan:  This is a routine wellness examination for Tyler [name].  Patient Care Team: Myrla Jon HERO, MD as PCP - General (Family Medicine) Hester Alm BROCKS, MD (Dermatology) Elma Zachary RAMAN [facility])  I have personally reviewed and noted the following in the patients chart:   Medical and social history Use of alcohol, tobacco or illicit drugs  Current medications and supplements  including opioid prescriptions. Functional ability and status Nutritional status Physical activity Advanced directives List of other physicians Hospitalizations, surgeries, and ER visits in previous 12 months Vitals Screenings to include cognitive, depression, and falls Referrals and appointments  No orders of the defined types were placed in this encounter.  In addition, I have reviewed and discussed with patient certain preventive protocols, quality metrics, and best practice recommendations. A written personalized care plan for preventive services as well as general preventive health recommendations were provided to patient.   Tyler RAMAN Das, LPN   [id number]  Return in about 1 year (around 10/08/2025).  After Visit Summary: (MyChart) Due to this being a telephonic visit, the after visit summary with patients personalized plan was offered to patient via MyChart   Nurse Notes: REFERRAL SENT FOR COLONOSCOPY  "

## 2024-10-14 ENCOUNTER — Telehealth: Payer: Self-pay

## 2024-10-14 NOTE — Telephone Encounter (Signed)
 LVM for pt to return my call. Mychart x1 Ref: colonoscopy referral.  ThanksRosaline CMA

## 2025-02-25 ENCOUNTER — Ambulatory Visit: Admitting: Dermatology

## 2025-04-21 ENCOUNTER — Encounter: Admitting: Family Medicine

## 2025-10-14 ENCOUNTER — Ambulatory Visit
